# Patient Record
Sex: Male | Born: 1945
Health system: Southern US, Community
[De-identification: ages and names within clinical notes are randomized; demographics above are authoritative.]

## PROBLEM LIST (undated history)

## (undated) DIAGNOSIS — K219 Gastro-esophageal reflux disease without esophagitis: Secondary | ICD-10-CM

## (undated) DIAGNOSIS — K759 Inflammatory liver disease, unspecified: Secondary | ICD-10-CM

## (undated) DIAGNOSIS — M1990 Unspecified osteoarthritis, unspecified site: Secondary | ICD-10-CM

## (undated) DIAGNOSIS — I499 Cardiac arrhythmia, unspecified: Secondary | ICD-10-CM

## (undated) DIAGNOSIS — M719 Bursopathy, unspecified: Secondary | ICD-10-CM

## (undated) DIAGNOSIS — R011 Cardiac murmur, unspecified: Secondary | ICD-10-CM

## (undated) DIAGNOSIS — I10 Essential (primary) hypertension: Secondary | ICD-10-CM

## (undated) DIAGNOSIS — K259 Gastric ulcer, unspecified as acute or chronic, without hemorrhage or perforation: Secondary | ICD-10-CM

## (undated) HISTORY — PX: TONSILLECTOMY: SUR1361

## (undated) HISTORY — PX: CARDIAC CATHETERIZATION: SHX172

## (undated) HISTORY — PX: BACK SURGERY: SHX140

## (undated) HISTORY — DX: Gastric ulcer, unspecified as acute or chronic, without hemorrhage or perforation: K25.9

## (undated) HISTORY — PX: COLONOSCOPY W/ POLYPECTOMY: SHX1380

---

## 2005-10-19 ENCOUNTER — Ambulatory Visit: Payer: Self-pay

## 2006-06-06 ENCOUNTER — Emergency Department: Payer: Self-pay | Admitting: Emergency Medicine

## 2006-06-06 ENCOUNTER — Other Ambulatory Visit: Payer: Self-pay

## 2006-12-05 ENCOUNTER — Ambulatory Visit: Payer: Self-pay | Admitting: Gastroenterology

## 2009-04-21 ENCOUNTER — Ambulatory Visit: Payer: Self-pay | Admitting: Family Medicine

## 2009-05-24 ENCOUNTER — Ambulatory Visit: Payer: Self-pay | Admitting: Family Medicine

## 2009-06-29 ENCOUNTER — Other Ambulatory Visit: Payer: Self-pay | Admitting: Family Medicine

## 2009-09-21 ENCOUNTER — Ambulatory Visit: Payer: Self-pay | Admitting: Family Medicine

## 2010-10-04 LAB — HM COLONOSCOPY

## 2010-10-20 ENCOUNTER — Ambulatory Visit: Payer: Self-pay | Admitting: Cardiovascular Disease

## 2014-03-06 ENCOUNTER — Ambulatory Visit: Payer: Self-pay | Admitting: Family Medicine

## 2014-08-20 ENCOUNTER — Ambulatory Visit
Admit: 2014-08-20 | Disposition: A | Discharge status: Home / Self Care | Payer: Self-pay | Attending: Family Medicine | Admitting: Family Medicine

## 2015-07-20 ENCOUNTER — Emergency Department
Admission: EM | Admit: 2015-07-20 | Discharge: 2015-07-20 | Disposition: A | Discharge status: Home / Self Care | Payer: PPO | Attending: Emergency Medicine | Admitting: Emergency Medicine

## 2015-07-20 ENCOUNTER — Ambulatory Visit: Payer: PPO | Admitting: Family Medicine

## 2015-07-20 ENCOUNTER — Encounter: Payer: Self-pay | Admitting: Medical Oncology

## 2015-07-20 ENCOUNTER — Telehealth: Payer: Self-pay

## 2015-07-20 ENCOUNTER — Emergency Department: Payer: PPO

## 2015-07-20 DIAGNOSIS — Z7982 Long term (current) use of aspirin: Secondary | ICD-10-CM | POA: Diagnosis not present

## 2015-07-20 DIAGNOSIS — I2510 Atherosclerotic heart disease of native coronary artery without angina pectoris: Secondary | ICD-10-CM | POA: Insufficient documentation

## 2015-07-20 DIAGNOSIS — R079 Chest pain, unspecified: Secondary | ICD-10-CM | POA: Diagnosis not present

## 2015-07-20 DIAGNOSIS — R0789 Other chest pain: Secondary | ICD-10-CM | POA: Insufficient documentation

## 2015-07-20 DIAGNOSIS — I509 Heart failure, unspecified: Secondary | ICD-10-CM | POA: Insufficient documentation

## 2015-07-20 DIAGNOSIS — I10 Essential (primary) hypertension: Secondary | ICD-10-CM | POA: Diagnosis not present

## 2015-07-20 DIAGNOSIS — Z79899 Other long term (current) drug therapy: Secondary | ICD-10-CM | POA: Diagnosis not present

## 2015-07-20 DIAGNOSIS — Z791 Long term (current) use of non-steroidal anti-inflammatories (NSAID): Secondary | ICD-10-CM | POA: Insufficient documentation

## 2015-07-20 HISTORY — DX: Unspecified osteoarthritis, unspecified site: M19.90

## 2015-07-20 LAB — CBC
HCT: 40.7 % (ref 40.0–52.0)
Hemoglobin: 14.2 g/dL (ref 13.0–18.0)
MCH: 30.7 pg (ref 26.0–34.0)
MCHC: 34.8 g/dL (ref 32.0–36.0)
MCV: 88.1 fL (ref 80.0–100.0)
PLATELETS: 156 10*3/uL (ref 150–440)
RBC: 4.62 MIL/uL (ref 4.40–5.90)
RDW: 12.7 % (ref 11.5–14.5)
WBC: 7.8 10*3/uL (ref 3.8–10.6)

## 2015-07-20 LAB — BASIC METABOLIC PANEL
Anion gap: 6 (ref 5–15)
BUN: 25 mg/dL — ABNORMAL HIGH (ref 6–20)
CALCIUM: 8.8 mg/dL — AB (ref 8.9–10.3)
CO2: 26 mmol/L (ref 22–32)
CREATININE: 1.37 mg/dL — AB (ref 0.61–1.24)
Chloride: 110 mmol/L (ref 101–111)
GFR, EST AFRICAN AMERICAN: 58 mL/min — AB (ref >60)
GFR, EST NON AFRICAN AMERICAN: 50 mL/min — AB (ref >60)
Glucose, Bld: 104 mg/dL — ABNORMAL HIGH (ref 65–99)
Potassium: 4.4 mmol/L (ref 3.5–5.1)
SODIUM: 142 mmol/L (ref 135–145)

## 2015-07-20 LAB — TROPONIN I
TROPONIN I: 0.04 ng/mL — AB (ref <0.031)
Troponin I: <0.03 ng/mL (ref <0.031)

## 2015-07-20 MED ORDER — ASPIRIN 81 MG PO CHEW
324.0000 mg | CHEWABLE_TABLET | Freq: Once | ORAL | Status: AC
  Administered 2015-07-20: 324 mg via ORAL
  Filled 2015-07-20: qty 4

## 2015-07-20 MED ORDER — ONDANSETRON 8 MG PO TBDP
8.0000 mg | ORAL_TABLET | Freq: Once | ORAL | Status: AC
  Administered 2015-07-20: 8 mg via ORAL
  Filled 2015-07-20: qty 1

## 2015-07-20 NOTE — ED Provider Notes (Signed)
[facility] Emergency Department Provider Note  ____________________________________________  Time seen: 12:15 PM  I have reviewed the triage vital signs and the nursing notes.   HISTORY  Chief Complaint Chest Pain    HPI Darius Parker is a 70 y.o. male who woke up around 8:00 this morning and while he was sitting down for breakfast around 8:30 or 9:00 he had the sudden onset of left-sided chest pain. It was sharp and lasted for 1 or 2 seconds of moderate intensity, and then resolved. Nonradiating, he felt cold but no shortness of breath vomiting or diaphoresis. Throat there is the morning he's continued his usual activity without any exertional symptoms. He denies any orthopnea or PND or other worsening of his chronic medical conditions.  He called his cardiologist's clinic Dr. Nehemiah Massed who noted they could work him in at 2:30 PM today. The patient felt like he should be seen sooner than that, so he called his primary care doctor who encouraged him to come to the emergency room. He's been taking his usual medicines including 81 mg aspirin this morning.     Past Medical History  Diagnosis Date  . Arthritis    Past Medical and Surgical History  Past Medical History Past Medical History  Diagnosis Date  . Hyperlipidemia  . Hypertension  . Coronary artery disease  . Cardiomyopathy, dilated  . CHF (congestive heart failure)   Past Surgical History He has past surgical history that includes Cardiac cath.   Medications and Allergies  Current Medications  Current Outpatient Prescriptions  Medication Sig Dispense Refill  . aspirin 81 MG EC tablet Take 81 mg by mouth once daily.  Marland Kitchen atorvastatin (LIPITOR) 10 MG tablet take 1 tablet by mouth NIGHTLY 90 tablet 2  . carvedilol (COREG) 12.5 MG tablet take 1 tablet by mouth twice a day with meals 180 tablet 2  . enalapril (VASOTEC) 10 MG tablet Take 1 tablet (10 mg total) by mouth 2 (two) times daily. 180  tablet 2  . meloxicam (MOBIC) 15 MG tablet 0  . omega-3 acid ethyl esters (LOVAZA) 1 gram capsule Take 2 capsules (2 g total) by mouth 2 (two) times daily. 360 capsule 2   No current facility-administered medications for this visit.   Allergies: Tetanus vaccines & toxoid  Social and Family History  Social History reports that he has never smoked. He does not have any smokeless tobacco history on file. He reports that he does not drink alcohol.  Family History Family History  Problem Relation Age of Onset  . Diabetes mellitus Father  . Heart disease Father     There are no active problems to display for this patient.    History reviewed. No pertinent past surgical history.   Current Outpatient Rx  Name  Route  Sig  Dispense  Refill  . aspirin EC 81 MG tablet   Oral   Take 81 mg by mouth daily.         Marland Kitchen atorvastatin (LIPITOR) 10 MG tablet   Oral   Take 10 mg by mouth at bedtime.          . carvedilol (COREG) 12.5 MG tablet   Oral   Take 12.5 mg by mouth 2 (two) times daily with a meal.         . enalapril (VASOTEC) 10 MG tablet   Oral   Take 10 mg by mouth 2 (two) times daily.         Marland Kitchen omega-3  acid ethyl esters (LOVAZA) 1 g capsule   Oral   Take 2 g by mouth 2 (two) times daily.          as above Coreg, enalapril, aspirin, statin   Allergies Tetanus toxoids   No family history on file.  Social History Social History  Substance Use Topics  . Smoking status: Never Smoker   . Smokeless tobacco: None  . Alcohol Use: None    Review of Systems  Constitutional:   No fever or chills. No weight changes Eyes:   No blurry vision or double vision.  ENT:   No sore throat.  Cardiovascular:   Positive fleeting chest pain as above. Respiratory:   No dyspnea or cough. Gastrointestinal:   Negative for abdominal pain, vomiting and diarrhea.  No BRBPR or melena. Genitourinary:   Negative for dysuria or difficulty urinating. Musculoskeletal:   Negative  for back pain. No joint swelling or pain. Skin:   Negative for rash. Neurological:   Negative for headaches, focal weakness or numbness. Psychiatric:  No anxiety or depression.   Endocrine:  No changes in energy or sleep difficulty.  10-point ROS otherwise negative.  ____________________________________________   PHYSICAL EXAM:  VITAL SIGNS: ED Triage Vitals  Enc Vitals Group     BP 07/20/15 1113 123/75 mmHg     Pulse Rate 07/20/15 1113 78     Resp 07/20/15 1113 17     Temp 07/20/15 1113 97.4 F (36.3 C)     Temp Source 07/20/15 1113 Oral     SpO2 07/20/15 1113 97 %     Weight 07/20/15 1111 180 lb (81.647 kg)     Height 07/20/15 1111 6\' 2"  (1.88 m)     Head Cir --      Peak Flow --      Pain Score 07/20/15 1111 0     Pain Loc --      Pain Edu? --      Excl. in [place]? --     Vital signs reviewed, nursing assessments reviewed.   Constitutional:   Alert and oriented. Well appearing and in no distress. Eyes:   No scleral icterus. No conjunctival pallor. PERRL. EOMI ENT   Head:   Normocephalic and atraumatic.   Nose:   No congestion/rhinnorhea. No septal hematoma   Mouth/Throat:   MMM, no pharyngeal erythema. No peritonsillar mass.    Neck:   No stridor. No SubQ emphysema. No meningismus. No JVD Hematological/Lymphatic/Immunilogical:   No cervical lymphadenopathy. Cardiovascular:   RRR. Symmetric bilateral radial and DP pulses.  No murmurs.  Respiratory:   Normal respiratory effort without tachypnea nor retractions. Breath sounds are clear and equal bilaterally. No wheezes/rales/rhonchi. Gastrointestinal:   Soft and nontender. Non distended. There is no CVA tenderness.  No rebound, rigidity, or guarding. Genitourinary:   deferred Musculoskeletal:   Nontender with normal range of motion in all extremities. No joint effusions.  No lower extremity tenderness.  No edema. Chest wall nontender Neurologic:   Normal speech and language.  CN 2-10 normal. Motor grossly  intact. No gross focal neurologic deficits are appreciated.  Skin:    Skin is warm, dry and intact. No rash noted.  No petechiae, purpura, or bullae. Psychiatric:   Mood and affect are normal. ____________________________________________    LABS (pertinent positives/negatives) (all labs ordered are listed, but only abnormal results are displayed) Labs Reviewed  BASIC METABOLIC PANEL - Abnormal; Notable for the following:    Glucose, Bld 104 (*)  BUN 25 (*)    Creatinine, Ser 1.37 (*)    Calcium 8.8 (*)    GFR calc non Af Amer 50 (*)    GFR calc Af Amer 58 (*)    All other components within normal limits  TROPONIN I - Abnormal; Notable for the following:    Troponin I 0.04 (*)    All other components within normal limits  CBC  TROPONIN I   ____________________________________________   EKG  Interpreted by me Sinus rhythm rate of 81, normal axis and intervals. Left bundle-branch block with associated repolarization abnormality, no Sgarbossa criteria. Unchanged from 06/06/2006  ____________________________________________    RADIOLOGY  Chest x-ray unremarkable  ____________________________________________   PROCEDURES   ____________________________________________   INITIAL IMPRESSION / ASSESSMENT AND PLAN / ED COURSE  Pertinent labs & imaging results that were available during my care of the patient were reviewed by me and considered in my medical decision making (see chart for details).  Patient presents with a brief episode of fleeting chest pain that lasted probably a few seconds and is not consistent with ACS.Considering the patient's symptoms, medical history, and physical examination today, I have low suspicion for ACS, PE, TAD, pneumothorax, carditis, mediastinitis, pneumonia, CHF, or sepsis.  appears his chronic medical conditions are roughly at baseline. He does not appear to be volume overloaded. No distress. I'll check a repeat troponin and then plan  for follow-up with cardiology. Zofran for nausea and full dose aspirin for now. Likely his mildly elevated troponin of 0.04 is chronic due to his systolic heart failure with an EF of 20% and dilated cardiomyopathy.   ----------------------------------------- 3:30 PM on 07/20/2015 -----------------------------------------  Repeat troponin negative. No recurrence of chest pain in the emergency department. Vital signs are stable and normal. We'll discharge home to follow up with cardiology Dr. Alveria Apley office in one day.    ____________________________________________   FINAL CLINICAL IMPRESSION(S) / ED DIAGNOSES  Final diagnoses:  Atypical chest pain      Carrie Mew, MD 07/20/15 1530

## 2015-07-20 NOTE — Telephone Encounter (Signed)
Patient reports that he woke up this morning with discomfort in his chest. He describes it as a pressure sensation. Patient denies any shortness of breath, numbness or tingling in arms, headache, lightheadedness, or back pain. He reports that the pain is located in his mid-chest. He does feel a little nauseated. Denies any reflux symptoms or injuries. Patient has not been taking anything for pain. Patient does have history of HTN. Reports that this has been stable. Scheduled patient to be seen in the office today at 11:30.

## 2015-07-20 NOTE — Discharge Instructions (Signed)
Nonspecific Chest Pain  °Chest pain can be caused by many different conditions. There is always a chance that your pain could be related to something serious, such as a heart attack or a blood clot in your lungs. Chest pain can also be caused by conditions that are not life-threatening. If you have chest pain, it is very important to follow up with your health care provider. °CAUSES  °Chest pain can be caused by: °· Heartburn. °· Pneumonia or bronchitis. °· Anxiety or stress. °· Inflammation around your heart (pericarditis) or lung (pleuritis or pleurisy). °· A blood clot in your lung. °· A collapsed lung (pneumothorax). It can develop suddenly on its own (spontaneous pneumothorax) or from trauma to the chest. °· Shingles infection (varicella-zoster virus). °· Heart attack. °· Damage to the bones, muscles, and cartilage that make up your chest wall. This can include: °¨ Bruised bones due to injury. °¨ Strained muscles or cartilage due to frequent or repeated coughing or overwork. °¨ Fracture to one or more ribs. °¨ Sore cartilage due to inflammation (costochondritis). °RISK FACTORS  °Risk factors for chest pain may include: °· Activities that increase your risk for trauma or injury to your chest. °· Respiratory infections or conditions that cause frequent coughing. °· Medical conditions or overeating that can cause heartburn. °· Heart disease or family history of heart disease. °· Conditions or health behaviors that increase your risk of developing a blood clot. °· Having had chicken pox (varicella zoster). °SIGNS AND SYMPTOMS °Chest pain can feel like: °· Burning or tingling on the surface of your chest or deep in your chest. °· Crushing, pressure, aching, or squeezing pain. °· Dull or sharp pain that is worse when you move, cough, or take a deep breath. °· Pain that is also felt in your back, neck, shoulder, or arm, or pain that spreads to any of these areas. °Your chest pain may come and go, or it may stay  constant. °DIAGNOSIS °Lab tests or other studies may be needed to find the cause of your pain. Your health care provider may have you take a test called an ambulatory ECG (electrocardiogram). An ECG records your heartbeat patterns at the time the test is performed. You may also have other tests, such as: °· Transthoracic echocardiogram (TTE). During echocardiography, sound waves are used to create a picture of all of the heart structures and to look at how blood flows through your heart. °· Transesophageal echocardiogram (TEE). This is a more advanced imaging test that obtains images from inside your body. It allows your health care provider to see your heart in finer detail. °· Cardiac monitoring. This allows your health care provider to monitor your heart rate and rhythm in real time. °· Holter monitor. This is a portable device that records your heartbeat and can help to diagnose abnormal heartbeats. It allows your health care provider to track your heart activity for several days, if needed. °· Stress tests. These can be done through exercise or by taking medicine that makes your heart beat more quickly. °· Blood tests. °· Imaging tests. °TREATMENT  °Your treatment depends on what is causing your chest pain. Treatment may include: °· Medicines. These may include: °¨ Acid blockers for heartburn. °¨ Anti-inflammatory medicine. °¨ Pain medicine for inflammatory conditions. °¨ Antibiotic medicine, if an infection is present. °¨ Medicines to dissolve blood clots. °¨ Medicines to treat coronary artery disease. °· Supportive care for conditions that do not require medicines. This may include: °¨ Resting. °¨ Applying heat   or cold packs to injured areas. °¨ Limiting activities until pain decreases. °HOME CARE INSTRUCTIONS °· If you were prescribed an antibiotic medicine, finish it all even if you start to feel better. °· Avoid any activities that bring on chest pain. °· Do not use any tobacco products, including  cigarettes, chewing tobacco, or electronic cigarettes. If you need help quitting, ask your health care provider. °· Do not drink alcohol. °· Take medicines only as directed by your health care provider. °· Keep all follow-up visits as directed by your health care provider. This is important. This includes any further testing if your chest pain does not go away. °· If heartburn is the cause for your chest pain, you may be told to keep your head raised (elevated) while sleeping. This reduces the chance that acid will go from your stomach into your esophagus. °· Make lifestyle changes as directed by your health care provider. These may include: °¨ Getting regular exercise. Ask your health care provider to suggest some activities that are safe for you. °¨ Eating a heart-healthy diet. A registered dietitian can help you to learn healthy eating options. °¨ Maintaining a healthy weight. °¨ Managing diabetes, if necessary. °¨ Reducing stress. °SEEK MEDICAL CARE IF: °· Your chest pain does not go away after treatment. °· You have a rash with blisters on your chest. °· You have a fever. °SEEK IMMEDIATE MEDICAL CARE IF:  °· Your chest pain is worse. °· You have an increasing cough, or you cough up blood. °· You have severe abdominal pain. °· You have severe weakness. °· You faint. °· You have chills. °· You have sudden, unexplained chest discomfort. °· You have sudden, unexplained discomfort in your arms, back, neck, or jaw. °· You have shortness of breath at any time. °· You suddenly start to sweat, or your skin gets clammy. °· You feel nauseous or you vomit. °· You suddenly feel light-headed or dizzy. °· Your heart begins to beat quickly, or it feels like it is skipping beats. °These symptoms may represent a serious problem that is an emergency. Do not wait to see if the symptoms will go away. Get medical help right away. Call your local emergency services (911 in the [place] Do not drive yourself to the hospital. °  °This  information is not intended to replace advice given to you by your health care provider. Make sure you discuss any questions you have with your health care provider. °  °Document Released: 02/15/2005 Document Revised: 05/29/2014 Document Reviewed: 12/12/2013 °Elsevier Interactive Patient Education ©2016 [employer]. ° °

## 2015-07-20 NOTE — ED Notes (Signed)
Pt reports he woke up this am around 0800 with sharp pain to left side of chest. Pt also reports nausea.

## 2015-08-05 DIAGNOSIS — H25813 Combined forms of age-related cataract, bilateral: Secondary | ICD-10-CM | POA: Diagnosis not present

## 2015-08-05 DIAGNOSIS — H01009 Unspecified blepharitis unspecified eye, unspecified eyelid: Secondary | ICD-10-CM | POA: Diagnosis not present

## 2015-11-26 ENCOUNTER — Other Ambulatory Visit: Payer: Self-pay | Admitting: Family Medicine

## 2015-11-26 DIAGNOSIS — E785 Hyperlipidemia, unspecified: Secondary | ICD-10-CM

## 2015-11-26 MED ORDER — OMEGA-3-ACID ETHYL ESTERS 1 G PO CAPS: 2.0000 g | ORAL_CAPSULE | Freq: Two times a day (BID) | ORAL | Status: DC

## 2015-11-29 ENCOUNTER — Telehealth: Payer: Self-pay | Admitting: Family Medicine

## 2015-11-29 ENCOUNTER — Other Ambulatory Visit: Payer: Self-pay | Admitting: Family Medicine

## 2015-11-29 DIAGNOSIS — E785 Hyperlipidemia, unspecified: Secondary | ICD-10-CM

## 2015-11-29 MED ORDER — OMEGA-3-ACID ETHYL ESTERS 1 G PO CAPS: 1.0000 g | ORAL_CAPSULE | Freq: Two times a day (BID) | ORAL | Status: DC

## 2015-11-29 NOTE — Telephone Encounter (Signed)
The pharmacy sent me a refill order and I responded to it. If Dr.Kowalski has him on a different dose then go with what Dr. Raliegh Ip recommends. What is the dose he is on so we can correct our records.

## 2015-11-29 NOTE — Telephone Encounter (Signed)
Advised pt. Is taking 1 gm BID. Changed in records. Renaldo Fiddler, CMA

## 2015-11-29 NOTE — Telephone Encounter (Signed)
Pt would like to know why we filled the omega-3 acid ethyl esters (LOVAZA) 1 g capsule instead of Dr. Alveria Apley office.  The dosage has changed and he wants to know why.

## 2015-11-29 NOTE — Telephone Encounter (Signed)
Was signed on 11/26/2015. Please advise. Renaldo Fiddler, CMA

## 2016-01-18 DIAGNOSIS — R42 Dizziness and giddiness: Secondary | ICD-10-CM | POA: Diagnosis not present

## 2016-01-18 DIAGNOSIS — E782 Mixed hyperlipidemia: Secondary | ICD-10-CM | POA: Diagnosis not present

## 2016-01-18 DIAGNOSIS — I10 Essential (primary) hypertension: Secondary | ICD-10-CM | POA: Diagnosis not present

## 2016-01-18 DIAGNOSIS — I5022 Chronic systolic (congestive) heart failure: Secondary | ICD-10-CM | POA: Diagnosis not present

## 2016-02-17 DIAGNOSIS — H2513 Age-related nuclear cataract, bilateral: Secondary | ICD-10-CM | POA: Diagnosis not present

## 2016-02-24 DIAGNOSIS — H2513 Age-related nuclear cataract, bilateral: Secondary | ICD-10-CM | POA: Diagnosis not present

## 2016-02-28 ENCOUNTER — Encounter: Payer: Self-pay | Admitting: *Deleted

## 2016-03-01 ENCOUNTER — Ambulatory Visit: Payer: PPO | Admitting: Anesthesiology

## 2016-03-01 ENCOUNTER — Encounter: Payer: Self-pay | Admitting: *Deleted

## 2016-03-01 ENCOUNTER — Ambulatory Visit
Admission: RE | Admit: 2016-03-01 | Discharge: 2016-03-01 | Disposition: A | Discharge status: Home / Self Care | Payer: PPO | Source: Ambulatory Visit | Attending: Ophthalmology | Admitting: Ophthalmology

## 2016-03-01 ENCOUNTER — Encounter
Admission: RE | Disposition: A | Discharge status: Home / Self Care | Payer: Self-pay | Source: Ambulatory Visit | Attending: Ophthalmology

## 2016-03-01 DIAGNOSIS — R011 Cardiac murmur, unspecified: Secondary | ICD-10-CM | POA: Insufficient documentation

## 2016-03-01 DIAGNOSIS — K5790 Diverticulosis of intestine, part unspecified, without perforation or abscess without bleeding: Secondary | ICD-10-CM | POA: Diagnosis not present

## 2016-03-01 DIAGNOSIS — Z86010 Personal history of colonic polyps: Secondary | ICD-10-CM | POA: Insufficient documentation

## 2016-03-01 DIAGNOSIS — H2511 Age-related nuclear cataract, right eye: Secondary | ICD-10-CM | POA: Insufficient documentation

## 2016-03-01 DIAGNOSIS — I499 Cardiac arrhythmia, unspecified: Secondary | ICD-10-CM | POA: Diagnosis not present

## 2016-03-01 DIAGNOSIS — E7800 Pure hypercholesterolemia, unspecified: Secondary | ICD-10-CM | POA: Insufficient documentation

## 2016-03-01 DIAGNOSIS — Z887 Allergy status to serum and vaccine status: Secondary | ICD-10-CM | POA: Diagnosis not present

## 2016-03-01 DIAGNOSIS — I10 Essential (primary) hypertension: Secondary | ICD-10-CM | POA: Insufficient documentation

## 2016-03-01 DIAGNOSIS — M1990 Unspecified osteoarthritis, unspecified site: Secondary | ICD-10-CM | POA: Insufficient documentation

## 2016-03-01 DIAGNOSIS — M719 Bursopathy, unspecified: Secondary | ICD-10-CM | POA: Diagnosis not present

## 2016-03-01 DIAGNOSIS — H2513 Age-related nuclear cataract, bilateral: Secondary | ICD-10-CM | POA: Diagnosis not present

## 2016-03-01 HISTORY — DX: Bursopathy, unspecified: M71.9

## 2016-03-01 HISTORY — DX: Essential (primary) hypertension: I10

## 2016-03-01 HISTORY — DX: Cardiac arrhythmia, unspecified: I49.9

## 2016-03-01 HISTORY — DX: Cardiac murmur, unspecified: R01.1

## 2016-03-01 HISTORY — DX: Inflammatory liver disease, unspecified: K75.9

## 2016-03-01 HISTORY — PX: CATARACT EXTRACTION W/PHACO: SHX586

## 2016-03-01 SURGERY — PHACOEMULSIFICATION, CATARACT, WITH IOL INSERTION
Anesthesia: General | Site: Eye | Laterality: Right | Wound class: Clean

## 2016-03-01 MED ORDER — SILVER NITRATE-POT NITRATE 75-25 % EX MISC
CUTANEOUS | Status: AC
  Filled 2016-03-01: qty 1

## 2016-03-01 MED ORDER — ONDANSETRON HCL 4 MG/2ML IJ SOLN
INTRAMUSCULAR | Status: DC | PRN
  Administered 2016-03-01: 4 mg via INTRAVENOUS

## 2016-03-01 MED ORDER — POVIDONE-IODINE 5 % OP SOLN
OPHTHALMIC | Status: DC | PRN
  Administered 2016-03-01: 1 via OPHTHALMIC

## 2016-03-01 MED ORDER — HYALURONIDASE HUMAN 150 UNIT/ML IJ SOLN
INTRAMUSCULAR | Status: AC
  Filled 2016-03-01: qty 1

## 2016-03-01 MED ORDER — MOXIFLOXACIN HCL 0.5 % OP SOLN
OPHTHALMIC | Status: AC
  Administered 2016-03-01: 1 [drp] via OPHTHALMIC
  Filled 2016-03-01: qty 3

## 2016-03-01 MED ORDER — EPINEPHRINE PF 1 MG/ML IJ SOLN
INTRAMUSCULAR | Status: AC
  Filled 2016-03-01: qty 2

## 2016-03-01 MED ORDER — PHENYLEPHRINE HCL 10 % OP SOLN
OPHTHALMIC | Status: AC
  Administered 2016-03-01: 1 [drp] via OPHTHALMIC
  Filled 2016-03-01: qty 5

## 2016-03-01 MED ORDER — EPINEPHRINE PF 1 MG/ML IJ SOLN
INTRAOCULAR | Status: DC | PRN
  Administered 2016-03-01: 1 mL via OPHTHALMIC

## 2016-03-01 MED ORDER — TETRACAINE HCL 0.5 % OP SOLN
OPHTHALMIC | Status: AC
  Filled 2016-03-01: qty 2

## 2016-03-01 MED ORDER — CEFUROXIME OPHTHALMIC INJECTION 1 MG/0.1 ML
INJECTION | OPHTHALMIC | Status: DC | PRN
  Administered 2016-03-01: 0.1 mL via INTRACAMERAL

## 2016-03-01 MED ORDER — CEFUROXIME OPHTHALMIC INJECTION 1 MG/0.1 ML
INJECTION | OPHTHALMIC | Status: AC
  Filled 2016-03-01: qty 0.1

## 2016-03-01 MED ORDER — SODIUM CHLORIDE 0.9 % IV SOLN
INTRAVENOUS | Status: DC
  Administered 2016-03-01: 08:00:00 via INTRAVENOUS

## 2016-03-01 MED ORDER — NA CHONDROIT SULF-NA HYALURON 40-17 MG/ML IO SOLN
INTRAOCULAR | Status: AC
  Filled 2016-03-01: qty 1

## 2016-03-01 MED ORDER — LIDOCAINE HCL (PF) 4 % IJ SOLN
INTRAMUSCULAR | Status: DC | PRN
  Administered 2016-03-01: 4 mL via OPHTHALMIC

## 2016-03-01 MED ORDER — LIDOCAINE HCL (PF) 4 % IJ SOLN
INTRAMUSCULAR | Status: AC
  Filled 2016-03-01: qty 5

## 2016-03-01 MED ORDER — EPINEPHRINE PF 1 MG/ML IJ SOLN
INTRAMUSCULAR | Status: AC
  Filled 2016-03-01: qty 1

## 2016-03-01 MED ORDER — LIDOCAINE HCL (PF) 4 % IJ SOLN
INTRAMUSCULAR | Status: DC | PRN
  Administered 2016-03-01: 9 mL via OPHTHALMIC

## 2016-03-01 MED ORDER — CYCLOPENTOLATE HCL 2 % OP SOLN
1.0000 [drp] | OPHTHALMIC | Status: AC
  Administered 2016-03-01 (×3): 1 [drp] via OPHTHALMIC

## 2016-03-01 MED ORDER — CYCLOPENTOLATE HCL 2 % OP SOLN
OPHTHALMIC | Status: AC
Start: 2016-03-01 — End: 2016-03-01
  Administered 2016-03-01: 1 [drp] via OPHTHALMIC
  Filled 2016-03-01: qty 2

## 2016-03-01 MED ORDER — CARBACHOL 0.01 % IO SOLN
INTRAOCULAR | Status: DC | PRN
  Administered 2016-03-01: 0.5 mL via INTRAOCULAR

## 2016-03-01 MED ORDER — MIDAZOLAM HCL 2 MG/2ML IJ SOLN
INTRAMUSCULAR | Status: DC | PRN
  Administered 2016-03-01 (×2): 1 mg via INTRAVENOUS

## 2016-03-01 MED ORDER — BUPIVACAINE HCL (PF) 0.75 % IJ SOLN
INTRAMUSCULAR | Status: AC
  Filled 2016-03-01: qty 10

## 2016-03-01 MED ORDER — NA CHONDROIT SULF-NA HYALURON 40-17 MG/ML IO SOLN
INTRAOCULAR | Status: DC | PRN
  Administered 2016-03-01: 1 mL via INTRAOCULAR

## 2016-03-01 MED ORDER — ALFENTANIL 500 MCG/ML IJ INJ
INJECTION | INTRAMUSCULAR | Status: DC | PRN
  Administered 2016-03-01: 500 ug via INTRAVENOUS

## 2016-03-01 MED ORDER — MOXIFLOXACIN HCL 0.5 % OP SOLN
OPHTHALMIC | Status: DC | PRN
  Administered 2016-03-01: 1 [drp] via OPHTHALMIC

## 2016-03-01 MED ORDER — MOXIFLOXACIN HCL 0.5 % OP SOLN
1.0000 [drp] | OPHTHALMIC | Status: AC
  Administered 2016-03-01 (×3): 1 [drp] via OPHTHALMIC

## 2016-03-01 MED ORDER — TETRACAINE HCL 0.5 % OP SOLN
OPHTHALMIC | Status: DC | PRN
  Administered 2016-03-01: 1 [drp] via OPHTHALMIC

## 2016-03-01 MED ORDER — PHENYLEPHRINE HCL 10 % OP SOLN
1.0000 [drp] | OPHTHALMIC | Status: AC
  Administered 2016-03-01 (×3): 1 [drp] via OPHTHALMIC

## 2016-03-01 MED ORDER — POVIDONE-IODINE 5 % OP SOLN
OPHTHALMIC | Status: AC
  Filled 2016-03-01: qty 30

## 2016-03-01 SURGICAL SUPPLY — 29 items

## 2016-03-01 NOTE — Transfer of Care (Signed)
Immediate Anesthesia Transfer of Care Note  Patient: JEFERSON REYMOND  Procedure(s) Performed: Procedure(s) with comments: CATARACT EXTRACTION PHACO AND INTRAOCULAR LENS PLACEMENT (IOC) (Right) - [place] 1.33 AP% 21.6 CDE 37.45 Fluid Pack Lot # [id number]  Patient Location: PACU  Anesthesia Type:General  Level of Consciousness: sedated  Airway & Oxygen Therapy: Patient Spontanous Breathing and Patient connected to face mask oxygen  Post-op Assessment: Report given to RN and Post -op Vital signs reviewed and stable  Post vital signs: Reviewed and stable  Last Vitals:  Vitals:   03/01/16 0946 03/01/16 0950  BP: 116/71 116/71  Pulse: 70   Resp: 16 12  Temp: 36.7 C [id number] C    Complications: No apparent anesthesia complications

## 2016-03-01 NOTE — Anesthesia Preprocedure Evaluation (Signed)
Anesthesia Evaluation  Patient identified by MRN, date of birth, ID band Patient awake    Reviewed: Allergy & Precautions, H&P , NPO status , Patient's Chart, lab work & pertinent test results, reviewed documented beta blocker date and time   Airway Mallampati: II   Neck ROM: full    Dental  (+) Poor Dentition   Pulmonary neg pulmonary ROS,    Pulmonary exam normal        Cardiovascular hypertension, negative cardio ROS Normal cardiovascular exam+ dysrhythmias + Valvular Problems/Murmurs      Neuro/Psych negative neurological ROS  negative psych ROS   GI/Hepatic negative GI ROS, Neg liver ROS, (+) Hepatitis -  Endo/Other  negative endocrine ROS  Renal/GU negative Renal ROS  negative genitourinary   Musculoskeletal   Abdominal   Peds  Hematology negative hematology ROS (+)   Anesthesia Other Findings Past Medical History: No date: Arthritis No date: Bursitis No date: Dysrhythmia No date: Heart murmur No date: Hepatitis No date: Hypertension Past Surgical History: No date: BACK SURGERY No date: CARDIAC CATHETERIZATION No date: COLONOSCOPY W/ POLYPECTOMY No date: TONSILLECTOMY BMI    Body Mass Index:  22.47 kg/m     Reproductive/Obstetrics                             Anesthesia Physical Anesthesia Plan  ASA: III  Anesthesia Plan: General   Post-op Pain Management:    Induction:   Airway Management Planned:   Additional Equipment:   Intra-op Plan:   Post-operative Plan:   Informed Consent: I have reviewed the patients History and Physical, chart, labs and discussed the procedure including the risks, benefits and alternatives for the proposed anesthesia with the patient or authorized representative who has indicated his/her understanding and acceptance.   Dental Advisory Given  Plan Discussed with: CRNA  Anesthesia Plan Comments:         Anesthesia Quick  Evaluation

## 2016-03-01 NOTE — Anesthesia Procedure Notes (Signed)
Procedure Name: MAC Date/Time: 03/01/2016 9:10 AM Performed by: Doreen Salvage Pre-anesthesia Checklist: Patient identified, Emergency Drugs available, Suction available and Patient being monitored Patient Re-evaluated:Patient Re-evaluated prior to inductionOxygen Delivery Method: Nasal cannula

## 2016-03-01 NOTE — Op Note (Signed)
Date of Surgery: 03/01/2016 Date of Dictation: 03/01/2016 9:47 AM Pre-operative Diagnosis:  Nuclear Sclerotic Cataract right Eye Post-operative Diagnosis: same Procedure performed: Extra-capsular Cataract Extraction (ECCE) with placement of a posterior chamber intraocular lens (IOL) right Eye IOL:  Implant Name Type Inv. Item Serial No. Manufacturer Lot No. LRB No. Used  LENS IOL ACRYSOF IQ 20.5 - [id number] Intraocular Lens LENS IOL ACRYSOF IQ 20.5 [id number] ALCON   Right 1   Anesthesia: 2% Lidocaine and 4% Marcaine in a 50/50 mixture with 10 unites/ml of Hylenex given as a peribulbar Anesthesiologist: Anesthesiologist: Molli Barrows, MD CRNA: Doreen Salvage, CRNA Complications: none Estimated Blood Loss: less than 1 ml  Description of procedure:  The patient was given anesthesia and sedation via intravenous access. The patient was then prepped and draped in the usual fashion. A 25-gauge needle was bent for initiating the capsulorhexis. A 5-0 silk suture was placed through the conjunctiva superior and inferiorly to serve as bridle sutures. Hemostasis was obtained at the superior limbus using an eraser cautery. A partial thickness groove was made at the anterior surgical limbus with a 64 Beaver blade and this was dissected anteriorly with an Avaya. The anterior chamber was entered at 10 o'clock with a 1.0 mm paracentesis knife and through the lamellar dissection with a 2.6 mm Alcon keratome. Epi-Shugarcaine 0.5 CC [9 cc BSS Plus (Alcon), 3 cc 4% preservative-free lidocaine (Hospira) and 4 cc 1:1000 preservative-free, bisulfite-free epinephrine] was injected into the anterior chamber via the paracentesis tract. Epi-Shugarcaine 0.5 CC [9 cc BSS Plus (Alcon), 3 cc 4% preservative-free lidocaine (Hospira) and 4 cc 1:1000 preservative-free, bisulfite-free epinephrine] was injected into the anterior chamber via the paracentesis tract. DiscoVisc was injected to replace the aqueous and a  continuous tear curvilinear capsulorhexis was performed using a bent 25-gauge needle.  Balance salt on a syringe was used to perform hydro-dissection and phacoemulsification was carried out using a divide and conquer technique. Procedure(s) with comments: CATARACT EXTRACTION PHACO AND INTRAOCULAR LENS PLACEMENT (IOC) (Right) - [place] 1.33 AP% 21.6 CDE 37.45 Fluid Pack Lot # [id number]. Irrigation/aspiration was used to remove the residual cortex and the capsular bag was inflated with DiscoVisc. The intraocular lens was inserted into the capsular bag using a pre-loaded UltraSert Delivery System. Irrigation/aspiration was used to remove the residual DiscoVisc. The wound was inflated with balanced salt and checked for leaks. None were found. Miostat was injected via the paracentesis track and 0.1 ml of cefuroxime containing 1 mg of drug  was injected via the paracentesis track. The wound was checked for leaks again and none were found.   The bridal sutures were removed and two drops of Vigamox were placed on the eye. An eye shield was placed to protect the eye and the patient was discharged to the recovery area in good condition.   Aiva Miskell MD

## 2016-03-01 NOTE — H&P (Signed)
See scanned note.

## 2016-03-01 NOTE — Discharge Instructions (Signed)
Eye Surgery Discharge Instructions  Expect mild scratchy sensation or mild soreness. DO NOT RUB YOUR EYE!  The day of surgery:  Minimal physical activity, but bed rest is not required  No reading, computer work, or close hand work  No bending, lifting, or straining.  May watch TV  For 24 hours:  No driving, legal decisions, or alcoholic beverages  Safety precautions  Eat anything you prefer: It is better to start with liquids, then soup then solid foods.  _____ Eye patch should be worn until postoperative exam tomorrow.  ____ Solar shield eyeglasses should be worn for comfort in the sunlight/patch while sleeping  Resume all regular medications including aspirin or Coumadin if these were discontinued prior to surgery. You may shower, bathe, shave, or wash your hair. Tylenol may be taken for mild discomfort.  Call your doctor if you experience significant pain, nausea, or vomiting, fever > 101 or other signs of infection. [phone] or [phone] Specific instructions:  Follow-up Information    Shambhavi Salley, MD .   Specialty:  Ophthalmology Why:  October 12 at 10:10am Contact information: [address]   [place] [address] [phone]

## 2016-03-01 NOTE — Anesthesia Postprocedure Evaluation (Signed)
Anesthesia Post Note  Patient: Darius Parker  Procedure(s) Performed: Procedure(s) (LRB): CATARACT EXTRACTION PHACO AND INTRAOCULAR LENS PLACEMENT (IOC) (Right)  Patient location during evaluation: PACU Anesthesia Type: General Level of consciousness: awake and alert Pain management: pain level controlled Vital Signs Assessment: post-procedure vital signs reviewed and stable Respiratory status: spontaneous breathing, nonlabored ventilation, respiratory function stable and patient connected to nasal cannula oxygen Cardiovascular status: blood pressure returned to baseline and stable Postop Assessment: no signs of nausea or vomiting Anesthetic complications: no    Last Vitals:  Vitals:   03/01/16 0950 03/01/16 1003  BP: 116/71 126/67  Pulse:    Resp: 12 14  Temp: 36.7 C     Last Pain:  Vitals:   03/01/16 0950  TempSrc: Temporal                 Molli Barrows

## 2016-03-01 NOTE — Interval H&P Note (Signed)
History and Physical Interval Note:  03/01/2016 9:05 AM  Darius Parker  has presented today for surgery, with the diagnosis of NUCLEAR SCLEROTIC CATARACT RIGHT EYE  The various methods of treatment have been discussed with the patient and family. After consideration of risks, benefits and other options for treatment, the patient has consented to  Procedure(s) with comments: CATARACT EXTRACTION PHACO AND INTRAOCULAR LENS PLACEMENT (IOC) (Right) - [place] AP% CDE Fluid Pack Lot # [id number] as a surgical intervention .  The patient's history has been reviewed, patient examined, no change in status, stable for surgery.  I have reviewed the patient's chart and labs.  Questions were answered to the patient's satisfaction.     Nely Dedmon

## 2016-03-21 DIAGNOSIS — Z961 Presence of intraocular lens: Secondary | ICD-10-CM | POA: Diagnosis not present

## 2016-06-09 ENCOUNTER — Telehealth: Payer: Self-pay | Admitting: Family Medicine

## 2016-06-09 NOTE — Telephone Encounter (Signed)
[id number] 

## 2016-06-09 NOTE — Telephone Encounter (Signed)
If he has any blood in stool, fever over 102, or if unable to hold down liquids then needs office visit or go to ER. Otherwise, take frequent small sips of clear liquids or Gatoraid. Can take OTC Kaopectate for diarrhea.

## 2016-06-09 NOTE — Telephone Encounter (Signed)
Pt has been having diarrhea for several days.  Just wants to know what he should do and what he can take OTC  [phone]  Thanks Con Memos

## 2016-06-09 NOTE — Telephone Encounter (Signed)
Called pt back for more info. Patient stated that yesterday morning he threw-up and was nauseous, later diarrhea started. Patient stated that he has had diarrhea ever since, everything he eats or drinks goes straight through him. No fever or abdominal pain. Please advise?

## 2016-06-13 NOTE — Telephone Encounter (Signed)
Unable to leave a message. Patient's phone just kept ringing. Will try again later.

## 2016-06-15 NOTE — Telephone Encounter (Signed)
Patient stated that his symptoms have cleared up now.

## 2016-11-14 DIAGNOSIS — Z961 Presence of intraocular lens: Secondary | ICD-10-CM | POA: Diagnosis not present

## 2017-01-01 DIAGNOSIS — I10 Essential (primary) hypertension: Secondary | ICD-10-CM | POA: Diagnosis not present

## 2017-01-01 DIAGNOSIS — I2510 Atherosclerotic heart disease of native coronary artery without angina pectoris: Secondary | ICD-10-CM | POA: Diagnosis not present

## 2017-01-01 DIAGNOSIS — I5022 Chronic systolic (congestive) heart failure: Secondary | ICD-10-CM | POA: Diagnosis not present

## 2017-01-01 DIAGNOSIS — E782 Mixed hyperlipidemia: Secondary | ICD-10-CM | POA: Diagnosis not present

## 2017-01-15 DIAGNOSIS — E782 Mixed hyperlipidemia: Secondary | ICD-10-CM | POA: Diagnosis not present

## 2017-01-15 DIAGNOSIS — I5022 Chronic systolic (congestive) heart failure: Secondary | ICD-10-CM | POA: Diagnosis not present

## 2017-01-15 DIAGNOSIS — I10 Essential (primary) hypertension: Secondary | ICD-10-CM | POA: Diagnosis not present

## 2017-01-15 DIAGNOSIS — I2510 Atherosclerotic heart disease of native coronary artery without angina pectoris: Secondary | ICD-10-CM | POA: Diagnosis not present

## 2017-03-27 ENCOUNTER — Ambulatory Visit
Admission: RE | Admit: 2017-03-27 | Discharge: 2017-03-27 | Disposition: A | Discharge status: Home / Self Care | Payer: PPO | Source: Ambulatory Visit | Attending: Family Medicine | Admitting: Family Medicine

## 2017-03-27 ENCOUNTER — Ambulatory Visit: Payer: PPO | Admitting: Family Medicine

## 2017-03-27 ENCOUNTER — Encounter: Payer: Self-pay | Admitting: Family Medicine

## 2017-03-27 VITALS — BP 106/68 | HR 74 | Temp 97.5°F | Resp 16 | Wt 200.4 lb

## 2017-03-27 DIAGNOSIS — M25562 Pain in left knee: Secondary | ICD-10-CM

## 2017-03-27 DIAGNOSIS — M1712 Unilateral primary osteoarthritis, left knee: Secondary | ICD-10-CM | POA: Diagnosis not present

## 2017-03-27 DIAGNOSIS — I2510 Atherosclerotic heart disease of native coronary artery without angina pectoris: Secondary | ICD-10-CM | POA: Insufficient documentation

## 2017-03-27 DIAGNOSIS — M179 Osteoarthritis of knee, unspecified: Secondary | ICD-10-CM | POA: Diagnosis not present

## 2017-03-27 DIAGNOSIS — I10 Essential (primary) hypertension: Secondary | ICD-10-CM | POA: Insufficient documentation

## 2017-03-27 DIAGNOSIS — E782 Mixed hyperlipidemia: Secondary | ICD-10-CM | POA: Insufficient documentation

## 2017-03-27 DIAGNOSIS — I5022 Chronic systolic (congestive) heart failure: Secondary | ICD-10-CM | POA: Insufficient documentation

## 2017-03-27 NOTE — Progress Notes (Signed)
Subjective:     Patient ID: Darius Parker, male   DOB: 01-19-1946, 71 y.o.   MRN: [id number]  HPI  Chief Complaint  Patient presents with  . Knee Pain    Patient comes in office today with complaints of left knee pain. Patient states that he was moving the other day and was going up and down stairs when his knee gave out. Patient reports pain when bearing weight on leg.  States she was carrying a load of wood and was not holding on to the banister. Did not fall and denies prior knee surgery.   Review of Systems     Objective:   Physical Exam  Constitutional: He appears well-developed and well-nourished. He appears distressed (using a s.p. cane with w.b with mild antalgic gait).  Musculoskeletal:  Left knee without erythema, effusion or tenderness on palpation FROM. No patellar tenderness. McMurray's test is negative.       Assessment:    1. Acute pain of left knee - DG Knee Complete 4 Views Left; Future    Plan:    Discussed rest, icing, and Tylenol ES. Possible orthopedic referral pending x-ray report.

## 2017-03-27 NOTE — Patient Instructions (Signed)
Discussed icing for 20 minutes several x day. Add Tylenol  ES up to 3000 mg/day. Rest for now as best you can. We will decide about orthopedic evaluation after x-ray.

## 2017-03-28 ENCOUNTER — Telehealth: Payer: Self-pay

## 2017-03-28 NOTE — Telephone Encounter (Signed)
Give it until the end of the week to improve. Call us if not getting better for referral.

## 2017-03-28 NOTE — Telephone Encounter (Signed)
-----   Message from Carmon Ginsberg, [place] sent at 03/28/2017  7:31 AM EST ----- Mild arthritic changes. If knee not improving would recommend orthopedic evaluation. Do you wish to proceed?

## 2017-03-28 NOTE — Telephone Encounter (Signed)
Patient advised.KW 

## 2017-03-28 NOTE — Telephone Encounter (Signed)
Patient was advised he states that he would like your opinion on how long he should wait before he goes to see orthopedic?

## 2017-07-16 DIAGNOSIS — I5022 Chronic systolic (congestive) heart failure: Secondary | ICD-10-CM | POA: Diagnosis not present

## 2017-07-16 DIAGNOSIS — I10 Essential (primary) hypertension: Secondary | ICD-10-CM | POA: Diagnosis not present

## 2017-07-16 DIAGNOSIS — E782 Mixed hyperlipidemia: Secondary | ICD-10-CM | POA: Diagnosis not present

## 2017-07-16 DIAGNOSIS — I2510 Atherosclerotic heart disease of native coronary artery without angina pectoris: Secondary | ICD-10-CM | POA: Diagnosis not present

## 2017-07-16 DIAGNOSIS — R600 Localized edema: Secondary | ICD-10-CM | POA: Diagnosis not present

## 2017-07-25 DIAGNOSIS — E782 Mixed hyperlipidemia: Secondary | ICD-10-CM | POA: Diagnosis not present

## 2017-08-16 DIAGNOSIS — I10 Essential (primary) hypertension: Secondary | ICD-10-CM | POA: Diagnosis not present

## 2017-08-16 DIAGNOSIS — I5022 Chronic systolic (congestive) heart failure: Secondary | ICD-10-CM | POA: Diagnosis not present

## 2017-08-16 DIAGNOSIS — I2510 Atherosclerotic heart disease of native coronary artery without angina pectoris: Secondary | ICD-10-CM | POA: Diagnosis not present

## 2017-08-16 DIAGNOSIS — R600 Localized edema: Secondary | ICD-10-CM | POA: Diagnosis not present

## 2017-12-05 ENCOUNTER — Ambulatory Visit (INDEPENDENT_AMBULATORY_CARE_PROVIDER_SITE_OTHER): Payer: PPO | Admitting: Family Medicine

## 2017-12-05 ENCOUNTER — Encounter: Payer: Self-pay | Admitting: Family Medicine

## 2017-12-05 VITALS — BP 114/64 | HR 84 | Temp 98.2°F | Resp 16 | Wt 196.0 lb

## 2017-12-05 DIAGNOSIS — T675XXA Heat exhaustion, unspecified, initial encounter: Secondary | ICD-10-CM

## 2017-12-05 NOTE — Patient Instructions (Signed)
Discussed rehydration with Gatorade and water. Stay out of the heat for 24-48 hours. Let me know if new symptoms or not improving. Consider getting a physical this year.

## 2017-12-05 NOTE — Progress Notes (Signed)
  Subjective:     Patient ID: Darius Parker, male   DOB: 08-29-1945, 72 y.o.   MRN: [id number] Chief Complaint  Patient presents with  . Diarrhea    Started 11/30/2017; has resloved but still feeling bad.  No appetite, indigestion.  . Cough    Started this morning.     HPI States his stool was formed today. He admits to mowing lawns in the heat over the last few days. Reports cough x one today and felt he had a mucus plug which cleared.  Review of Systems     Objective:   Physical Exam  Constitutional: He appears well-developed and well-nourished. No distress.  Pulmonary/Chest: Breath sounds normal.  Abdominal: Soft. Bowel sounds are normal. There is no tenderness. There is no guarding.  Musculoskeletal: He exhibits no edema (of lower extremities).       Assessment:    1. Heat exhaustion, initial encounter    Plan:    Discussed rehydration and staying out of the heat for 24-48 hours. Encouraged scheduling a physical this year.

## 2017-12-19 DIAGNOSIS — I5022 Chronic systolic (congestive) heart failure: Secondary | ICD-10-CM | POA: Diagnosis not present

## 2017-12-19 DIAGNOSIS — E782 Mixed hyperlipidemia: Secondary | ICD-10-CM | POA: Diagnosis not present

## 2017-12-19 DIAGNOSIS — I10 Essential (primary) hypertension: Secondary | ICD-10-CM | POA: Diagnosis not present

## 2017-12-19 DIAGNOSIS — I2510 Atherosclerotic heart disease of native coronary artery without angina pectoris: Secondary | ICD-10-CM | POA: Diagnosis not present

## 2018-02-08 ENCOUNTER — Other Ambulatory Visit: Payer: Self-pay

## 2018-02-08 ENCOUNTER — Emergency Department: Payer: PPO

## 2018-02-08 ENCOUNTER — Inpatient Hospital Stay
Admission: EM | Admit: 2018-02-08 | Discharge: 2018-02-09 | DRG: 176 | Disposition: A | Discharge status: Home / Self Care | Payer: PPO | Attending: Internal Medicine | Admitting: Internal Medicine

## 2018-02-08 DIAGNOSIS — E782 Mixed hyperlipidemia: Secondary | ICD-10-CM | POA: Diagnosis not present

## 2018-02-08 DIAGNOSIS — I5022 Chronic systolic (congestive) heart failure: Secondary | ICD-10-CM | POA: Diagnosis not present

## 2018-02-08 DIAGNOSIS — J984 Other disorders of lung: Secondary | ICD-10-CM | POA: Diagnosis not present

## 2018-02-08 DIAGNOSIS — J189 Pneumonia, unspecified organism: Secondary | ICD-10-CM | POA: Diagnosis present

## 2018-02-08 DIAGNOSIS — I110 Hypertensive heart disease with heart failure: Secondary | ICD-10-CM | POA: Diagnosis present

## 2018-02-08 DIAGNOSIS — I10 Essential (primary) hypertension: Secondary | ICD-10-CM | POA: Diagnosis present

## 2018-02-08 DIAGNOSIS — Z7982 Long term (current) use of aspirin: Secondary | ICD-10-CM | POA: Diagnosis not present

## 2018-02-08 DIAGNOSIS — I2699 Other pulmonary embolism without acute cor pulmonale: Principal | ICD-10-CM | POA: Diagnosis present

## 2018-02-08 DIAGNOSIS — M1710 Unilateral primary osteoarthritis, unspecified knee: Secondary | ICD-10-CM | POA: Diagnosis present

## 2018-02-08 DIAGNOSIS — I2510 Atherosclerotic heart disease of native coronary artery without angina pectoris: Secondary | ICD-10-CM | POA: Diagnosis present

## 2018-02-08 HISTORY — DX: Pneumonia, unspecified organism: J18.9

## 2018-02-08 HISTORY — DX: Other pulmonary embolism without acute cor pulmonale: I26.99

## 2018-02-08 LAB — COMPREHENSIVE METABOLIC PANEL
ALT: 16 U/L (ref 0–44)
ANION GAP: 8 (ref 5–15)
AST: 20 U/L (ref 15–41)
Albumin: 3.9 g/dL (ref 3.5–5.0)
Alkaline Phosphatase: 84 U/L (ref 38–126)
BUN: 31 mg/dL — ABNORMAL HIGH (ref 8–23)
CHLORIDE: 108 mmol/L (ref 98–111)
CO2: 23 mmol/L (ref 22–32)
CREATININE: 1.61 mg/dL — AB (ref 0.61–1.24)
Calcium: 8.8 mg/dL — ABNORMAL LOW (ref 8.9–10.3)
GFR calc Af Amer: 47 mL/min — ABNORMAL LOW (ref >60)
GFR, EST NON AFRICAN AMERICAN: 41 mL/min — AB (ref >60)
Glucose, Bld: 115 mg/dL — ABNORMAL HIGH (ref 70–99)
POTASSIUM: 4 mmol/L (ref 3.5–5.1)
Sodium: 139 mmol/L (ref 135–145)
Total Bilirubin: 1 mg/dL (ref 0.3–1.2)
Total Protein: 7.2 g/dL (ref 6.5–8.1)

## 2018-02-08 LAB — CBC WITH DIFFERENTIAL/PLATELET
BASOS ABS: 0.1 10*3/uL (ref 0–0.1)
BASOS PCT: 1 %
Eosinophils Absolute: 0.4 10*3/uL (ref 0–0.7)
Eosinophils Relative: 3 %
HEMATOCRIT: 36.5 % — AB (ref 40.0–52.0)
Hemoglobin: 12.7 g/dL — ABNORMAL LOW (ref 13.0–18.0)
Lymphocytes Relative: 16 %
Lymphs Abs: 1.9 10*3/uL (ref 1.0–3.6)
MCH: 31.3 pg (ref 26.0–34.0)
MCHC: 34.9 g/dL (ref 32.0–36.0)
MCV: 89.6 fL (ref 80.0–100.0)
MONO ABS: 1.1 10*3/uL — AB (ref 0.2–1.0)
Monocytes Relative: 9 %
NEUTROS ABS: 8.6 10*3/uL — AB (ref 1.4–6.5)
Neutrophils Relative %: 71 %
Platelets: 150 10*3/uL (ref 150–440)
RBC: 4.07 MIL/uL — ABNORMAL LOW (ref 4.40–5.90)
RDW: 12.8 % (ref 11.5–14.5)
WBC: 12.1 10*3/uL — ABNORMAL HIGH (ref 3.8–10.6)

## 2018-02-08 LAB — TROPONIN I

## 2018-02-08 LAB — FIBRIN DERIVATIVES D-DIMER (ARMC ONLY): FIBRIN DERIVATIVES D-DIMER (ARMC): 2296.73 ng{FEU}/mL — AB (ref 0.00–499.00)

## 2018-02-08 MED ORDER — SODIUM CHLORIDE 0.9 % IV SOLN
1.0000 g | Freq: Two times a day (BID) | INTRAVENOUS | Status: DC
  Administered 2018-02-08: 1 g via INTRAVENOUS
  Filled 2018-02-08 (×3): qty 10

## 2018-02-08 MED ORDER — SODIUM CHLORIDE 0.9 % IV SOLN
500.0000 mg | Freq: Once | INTRAVENOUS | Status: AC
  Administered 2018-02-09: 500 mg via INTRAVENOUS
  Filled 2018-02-08: qty 500

## 2018-02-08 MED ORDER — IOHEXOL 350 MG/ML SOLN
60.0000 mL | Freq: Once | INTRAVENOUS | Status: AC | PRN
  Administered 2018-02-08: 60 mL via INTRAVENOUS

## 2018-02-08 MED ORDER — APIXABAN 5 MG PO TABS
10.0000 mg | ORAL_TABLET | Freq: Two times a day (BID) | ORAL | Status: DC
  Administered 2018-02-08 – 2018-02-09 (×2): 10 mg via ORAL
  Filled 2018-02-08 (×2): qty 2

## 2018-02-08 NOTE — H&P (Signed)
[facility] NAME: Darius Parker    MR#:  [id number]  DATE OF BIRTH:  10/13/1945  DATE OF ADMISSION:  02/08/2018  PRIMARY CARE PHYSICIAN: Birdie Sons, MD   REQUESTING/REFERRING PHYSICIAN: Cinda Quest, MD  CHIEF COMPLAINT:   Chief Complaint  Patient presents with  . Chest Pain    HISTORY OF PRESENT ILLNESS:  Darius Parker  is a 72 y.o. male who presents with chief complaint as above.  Chest pain on the right lower side of his chest, as well as some shortness of breath for the past couple of days walking to his mailbox.  Here in the ED he had work-up done which showed right-sided PE, as well as some opacity concerning for possible pneumonia versus pulmonary infarct.  His white count was somewhat elevated.  He does state that he took somewhat long car trip to the beach several weeks ago.  No prior history of DVT, no complaint of recent leg swelling or leg pain.  Hospitalist were called for admission  PAST MEDICAL HISTORY:   Past Medical History:  Diagnosis Date  . Arthritis   . Bursitis   . Dysrhythmia   . Heart murmur   . Hepatitis   . Hypertension      PAST SURGICAL HISTORY:   Past Surgical History:  Procedure Laterality Date  . BACK SURGERY    . CARDIAC CATHETERIZATION    . CATARACT EXTRACTION W/PHACO Right 03/01/2016   Procedure: CATARACT EXTRACTION PHACO AND INTRAOCULAR LENS PLACEMENT ([place]);  Surgeon: Estill Cotta, MD;  Location: [facility] ORS;  Service: Ophthalmology;  Laterality: Right;  [place] 1.33 AP% 21.6 CDE 37.45 Fluid Pack Lot # [id number]  . COLONOSCOPY W/ POLYPECTOMY    . TONSILLECTOMY       SOCIAL HISTORY:   Social History   Tobacco Use  . Smoking status: Never Smoker  . Smokeless tobacco: Never Used  Substance Use Topics  . Alcohol use: No     FAMILY HISTORY:  Family history reviewed and is noncontributory   DRUG ALLERGIES:   Allergies  Allergen Reactions  . Tetanus Toxoids Swelling     MEDICATIONS AT HOME:   Prior to Admission medications   Medication Sig Start Date End Date Taking? Authorizing Provider  aspirin EC 81 MG tablet Take 81 mg by mouth daily.   Yes [provider]  atorvastatin (LIPITOR) 10 MG tablet Take 10 mg at bedtime by mouth. 01/21/17  Yes [provider]  carvedilol (COREG) 12.5 MG tablet Take 12.5 mg by mouth 2 (two) times daily with a meal.   Yes [provider]  enalapril (VASOTEC) 10 MG tablet Take 10 mg by mouth 2 (two) times daily.   Yes [provider]  furosemide (LASIX) 20 MG tablet Take 20 mg by mouth daily.   Yes [provider]  omega-3 acid ethyl esters (LOVAZA) 1 g capsule Take 1 capsule (1 g total) by mouth 2 (two) times daily. 11/29/15  Yes Carmon Ginsberg, PA    REVIEW OF SYSTEMS:  Review of Systems  Constitutional: Positive for malaise/fatigue. Negative for chills, fever and weight loss.  HENT: Negative for ear pain, hearing loss and tinnitus.   Eyes: Negative for blurred vision, double vision, pain and redness.  Respiratory: Positive for shortness of breath. Negative for cough and hemoptysis.   Cardiovascular: Positive for chest pain. Negative for palpitations, orthopnea and leg swelling.  Gastrointestinal: Negative for abdominal pain, constipation, diarrhea, nausea and vomiting.  Genitourinary: Negative for dysuria, frequency and hematuria.  Musculoskeletal: Negative for back pain, joint pain and neck pain.  Skin:       No acne, rash, or lesions  Neurological: Negative for dizziness, tremors, focal weakness and weakness.  Endo/Heme/Allergies: Negative for polydipsia. Does not bruise/bleed easily.  Psychiatric/Behavioral: Negative for depression. The patient is not nervous/anxious and does not have insomnia.      VITAL SIGNS:   Vitals:   02/08/18 1949 02/08/18 2230 02/08/18 2300  BP: 132/70 115/67 120/83  Pulse: 94 89 91  Resp: 16 (!) 21 (!) 21  Temp: 98.3 F (36.8 C)     TempSrc: Oral    SpO2: 100% 96% 98%  Weight: 81.6 kg    Height: 6\' 2"  (1.88 m)     Wt Readings from Last 3 Encounters:  02/08/18 81.6 kg  12/05/17 88.9 kg  03/27/17 90.9 kg    PHYSICAL EXAMINATION:  Physical Exam  Vitals reviewed. Constitutional: He is oriented to person, place, and time. He appears well-developed and well-nourished. No distress.  HENT:  Head: Normocephalic and atraumatic.  Mouth/Throat: Oropharynx is clear and moist.  Eyes: Pupils are equal, round, and reactive to light. Conjunctivae and EOM are normal. No scleral icterus.  Neck: Normal range of motion. Neck supple. No JVD present. No thyromegaly present.  Cardiovascular: Normal rate, regular rhythm and intact distal pulses. Exam reveals no gallop and no friction rub.  No murmur heard. Respiratory: Effort normal and breath sounds normal. No respiratory distress. He has no wheezes. He has no rales.  GI: Soft. Bowel sounds are normal. He exhibits no distension. There is no tenderness.  Musculoskeletal: Normal range of motion. He exhibits no edema.  No arthritis, no gout  Lymphadenopathy:    He has no cervical adenopathy.  Neurological: He is alert and oriented to person, place, and time. No cranial nerve deficit.  No dysarthria, no aphasia  Skin: Skin is warm and dry. No rash noted. No erythema.  Psychiatric: He has a normal mood and affect. His behavior is normal. Judgment and thought content normal.    LABORATORY PANEL:   CBC Recent Labs  Lab 02/08/18 2002  WBC 12.1*  HGB 12.7*  HCT 36.5*  PLT 150   ------------------------------------------------------------------------------------------------------------------  Chemistries  Recent Labs  Lab 02/08/18 2002  NA 139  K 4.0  CL 108  CO2 23  GLUCOSE 115*  BUN 31*  CREATININE 1.61*  CALCIUM 8.8*  AST 20  ALT 16  ALKPHOS 84  BILITOT 1.0    ------------------------------------------------------------------------------------------------------------------  Cardiac Enzymes Recent Labs  Lab 02/08/18 2002  TROPONINI <0.03   ------------------------------------------------------------------------------------------------------------------  RADIOLOGY:  Dg Chest 2 View  Result Date: 02/08/2018 CLINICAL DATA:  Right-sided rib pain after coughing. EXAM: CHEST - 2 VIEW COMPARISON:  07/20/2015 FINDINGS: The lungs are clear without focal pneumonia, edema, pneumothorax or pleural effusion. Single nodular densities over each lower lobe are similar to prior and compatible with nipple shadows. Interstitial markings are diffusely coarsened with chronic features. Calcified hilar lymph nodes again noted. The visualized bony structures of the thorax are intact. IMPRESSION: No active cardiopulmonary disease. Electronically Signed   By: Misty Stanley M.D.   On: 02/08/2018 20:23   Ct Angio Chest Pe W And/or Wo Contrast  Result Date: 02/08/2018 CLINICAL DATA:  Cough and right-sided rib pain. EXAM: CT ANGIOGRAPHY CHEST WITH CONTRAST TECHNIQUE: Multidetector CT imaging of the chest was performed using the standard protocol during bolus administration of intravenous contrast. Multiplanar CT image reconstructions and  MIPs were obtained to evaluate the vascular anatomy. CONTRAST:  94mL OMNIPAQUE IOHEXOL 350 MG/ML SOLN COMPARISON:  1608 chest CT FINDINGS: Cardiovascular: Positive pulmonary emboli to the right lower lobe noted within segmental and subsegmental branches. No right heart strain with RV/LV ratio 0.65. Cardiomegaly with left ventricular enlargement. No pericardial effusion. Atherosclerotic nonaneurysmal thoracic aorta. Mediastinum/Nodes: No enlarged mediastinal, hilar, or axillary lymph nodes. Calcified prevascular lymph nodes consistent with old granulomatous disease. Small subcentimeter right hilar lymph nodes are noted possibly reactive. Thyroid  gland, trachea, and esophagus demonstrate no significant findings. Lungs/Pleura: Trace right effusion with bibasilar right greater than left hazy opacities likely representing dependent atelectasis. Alveolitis/pneumonitis or early changes of pneumonia are not entirely excluded at the right lung base. Mild bronchial thickening and faint atelectatic change in the lingula. No pneumothorax. Upper Abdomen: Nonacute.  Splenic granulomata. Musculoskeletal: No acute nor aggressive osseous abnormality. Review of the MIP images confirms the above findings. IMPRESSION: 1. Acute right lower lobe pulmonary emboli without right heart strain. These results were called by telephone at the time of interpretation on 02/08/2018 at 10:18 pm to Dr. Conni Slipper , who verbally acknowledged these results. 2. Patchy airspace opacities right greater than left likely representing atelectatic change. Subtle changes of pneumonia and less likely pulmonary infarct would be among differential possibilities. 3. Cardiomegaly with mild left ventricular dilatation. 4. Minimal aortic atherosclerosis. 5. Calcified mediastinal lymph nodes and splenic calcifications compatible with old granulomatous disease. Aortic Atherosclerosis (ICD10-I70.0). Electronically Signed   By: Ashley Royalty M.D.   On: 02/08/2018 22:19    EKG:   Orders placed or performed during the hospital encounter of 02/08/18  . ED EKG within 10 minutes  . ED EKG within 10 minutes  . EKG 12-Lead  . EKG 12-Lead    IMPRESSION AND PLAN:  Principal Problem:   Pulmonary embolism ([place]) -started on Eliquis, no right heart strain seen on CT, will get an echocardiogram in the morning Active Problems:   CAP (community acquired pneumonia) -IV antibiotics initiated, supportive treatment PRN   Essential hypertension -stable, continue home meds   Chronic systolic heart failure ([place]) -continue home medications   Coronary artery disease involving native coronary artery of native heart  without angina pectoris -continue home meds   Hyperlipidemia, mixed -Home dose antilipid  Chart review performed and case discussed with ED provider. Labs, imaging and/or ECG reviewed by provider and discussed with patient/family. Management plans discussed with the patient and/or family.  DVT PROPHYLAXIS: Systemic anticoagulation  GI PROPHYLAXIS:  None  ADMISSION STATUS: Inpatient     CODE STATUS: Full  TOTAL TIME TAKING CARE OF THIS PATIENT: 45 minutes.   Mychal Durio [address] 02/08/2018, 11:36 PM  [facility]  Office  [phone]  CC: Primary care physician; Birdie Sons, MD  Note:  This document was prepared using Dragon voice recognition software and may include unintentional dictation errors.

## 2018-02-08 NOTE — ED Triage Notes (Addendum)
Pt states at noon today with coughing began to experience right sided rib pain to ruq. Pt points to lower aspect of anterior right rib cage. Pt denies shob, fever. Pt appears in no acute distress.

## 2018-02-08 NOTE — ED Provider Notes (Signed)
[facility] Emergency Department Provider Note   ____________________________________________   First MD Initiated Contact with Patient 02/08/18 2141     (approximate)  I have reviewed the triage vital signs and the nursing notes.   HISTORY  Chief Complaint Chest Pain    HPI Darius Parker is a 72 y.o. male patient complains of pain in the right lower part of the chest.  More lateral than anterior.  Started today after some coughing.  Is still hurting there now.  Patient still having a dry cough.  Some worsening of pain with deep breathing.  He is not short of breath is not having a fever the cough is again not productive.  He has not had this before.  He has not had any other injuries.  Patient has a history of heart disease hypertension heart failure.   Past Medical History:  Diagnosis Date  . Arthritis   . Bursitis   . Dysrhythmia   . Heart murmur   . Hepatitis   . Hypertension     Patient Active Problem List   Diagnosis Date Noted  . Essential hypertension 03/27/2017  . Chronic systolic heart failure ([place]) 03/27/2017  . Hyperlipidemia, mixed 03/27/2017  . Coronary artery disease involving native coronary artery of native heart without angina pectoris 03/27/2017    Past Surgical History:  Procedure Laterality Date  . BACK SURGERY    . CARDIAC CATHETERIZATION    . CATARACT EXTRACTION W/PHACO Right 03/01/2016   Procedure: CATARACT EXTRACTION PHACO AND INTRAOCULAR LENS PLACEMENT ([place]);  Surgeon: Estill Cotta, MD;  Location: [facility] ORS;  Service: Ophthalmology;  Laterality: Right;  [place] 1.33 AP% 21.6 CDE 37.45 Fluid Pack Lot # [id number]  . COLONOSCOPY W/ POLYPECTOMY    . TONSILLECTOMY      Prior to Admission medications   Medication Sig Start Date End Date Taking? Authorizing Provider  aspirin EC 81 MG tablet Take 81 mg by mouth daily.    [provider]  atorvastatin (LIPITOR) 10 MG tablet Take 10 mg at bedtime by mouth. 01/21/17    [provider]  carvedilol (COREG) 12.5 MG tablet Take 12.5 mg by mouth 2 (two) times daily with a meal.    [provider]  enalapril (VASOTEC) 10 MG tablet Take 10 mg by mouth 2 (two) times daily.    [provider]  furosemide (LASIX) 20 MG tablet Take 20 mg by mouth daily.    [provider]  omega-3 acid ethyl esters (LOVAZA) 1 g capsule Take 1 capsule (1 g total) by mouth 2 (two) times daily. 11/29/15   Carmon Ginsberg, PA    Allergies Tetanus toxoids  No family history on file.  Social History Social History   Tobacco Use  . Smoking status: Never Smoker  . Smokeless tobacco: Never Used  Substance Use Topics  . Alcohol use: No  . Drug use: Not on file    Review of Systems  Constitutional: No fever/chills Eyes: No visual changes. ENT: No sore throat. Cardiovascular:  chest pain. Respiratory: Denies shortness of breath. Gastrointestinal: No abdominal pain.  No nausea, no vomiting.  No diarrhea.  No constipation. Genitourinary: Negative for dysuria. Musculoskeletal: Negative for back pain. Skin: Negative for rash. Neurological: Negative for headaches, focal weakness   ____________________________________________   PHYSICAL EXAM:  VITAL SIGNS: ED Triage Vitals [02/08/18 1949]  Enc Vitals Group     BP 132/70     Pulse Rate 94     Resp 16  Temp 98.3 F (36.8 C)     Temp Source Oral     SpO2 100 %     Weight 180 lb (81.6 kg)     Height 6\' 2"  (1.88 m)     Head Circumference      Peak Flow      Pain Score 6     Pain Loc      Pain Edu?      Excl. in [place]?     Constitutional: Alert and oriented. Well appearing and in no acute distress. Eyes: Conjunctivae are normal.  Head: Atraumatic. Nose: No congestion/rhinnorhea. Mouth/Throat: Mucous membranes are moist.  Oropharynx non-erythematous. Neck: No stridor. Cardiovascular: Normal rate, regular rhythm. Grossly normal heart sounds.  Good peripheral  circulation. Respiratory: Normal respiratory effort.  No retractions. Lungs CTAB.  Chest wall is not tender Gastrointestinal: Soft and nontender. No distention. No abdominal bruits. No CVA tenderness. Musculoskeletal: No lower extremity tenderness nor edema.   Neurologic:  Normal speech and language. No gross focal neurologic deficits are appreciated.  Skin:  Skin is warm, dry and intact. No rash noted. Psychiatric: Mood and affect are normal. Speech and behavior are normal.  ____________________________________________   LABS (all labs ordered are listed, but only abnormal results are displayed)  Labs Reviewed  COMPREHENSIVE METABOLIC PANEL - Abnormal; Notable for the following components:      Result Value   Glucose, Bld 115 (*)    BUN 31 (*)    Creatinine, Ser 1.61 (*)    Calcium 8.8 (*)    GFR calc non Af Amer 41 (*)    GFR calc Af Amer 47 (*)    All other components within normal limits  FIBRIN DERIVATIVES D-DIMER ([facility] ONLY) - Abnormal; Notable for the following components:   Fibrin derivatives [facility]-dim[facility]) 2,296.73 (*)    All other components within normal limits  CBC WITH DIFFERENTIAL/PLATELET - Abnormal; Notable for the following components:   WBC 12.1 (*)    RBC 4.07 (*)    Hemoglobin 12.7 (*)    HCT 36.5 (*)    Neutro Abs 8.6 (*)    Monocytes Absolute 1.1 (*)    All other components within normal limits  CULTURE, BLOOD (ROUTINE X 2)  CULTURE, BLOOD (ROUTINE X 2)  TROPONIN I   ____________________________________________  EKG  EKG read interpreted by me shows normal sinus rhythm rate of 98 left axis patient has left bundle branch block with ST elevation but this is similar to EKG from 12 July 2015. ____________________________________________  [place]  ED MD interpretation: Chest x-ray read by radiology reviewed by me shows no acute disease  Official radiology report(s): Dg Chest 2 View  Result Date: 02/08/2018 CLINICAL DATA:  Right-sided rib pain  after coughing. EXAM: CHEST - 2 VIEW COMPARISON:  07/20/2015 FINDINGS: The lungs are clear without focal pneumonia, edema, pneumothorax or pleural effusion. Single nodular densities over each lower lobe are similar to prior and compatible with nipple shadows. Interstitial markings are diffusely coarsened with chronic features. Calcified hilar lymph nodes again noted. The visualized bony structures of the thorax are intact. IMPRESSION: No active cardiopulmonary disease. Electronically Signed   By: Misty Stanley M.D.   On: 02/08/2018 20:23   Ct Angio Chest Pe W And/or Wo Contrast  Result Date: 02/08/2018 CLINICAL DATA:  Cough and right-sided rib pain. EXAM: CT ANGIOGRAPHY CHEST WITH CONTRAST TECHNIQUE: Multidetector CT imaging of the chest was performed using the standard protocol during bolus administration of intravenous contrast. Multiplanar CT  image reconstructions and MIPs were obtained to evaluate the vascular anatomy. CONTRAST:  34mL OMNIPAQUE IOHEXOL 350 MG/ML SOLN COMPARISON:  1608 chest CT FINDINGS: Cardiovascular: Positive pulmonary emboli to the right lower lobe noted within segmental and subsegmental branches. No right heart strain with RV/LV ratio 0.65. Cardiomegaly with left ventricular enlargement. No pericardial effusion. Atherosclerotic nonaneurysmal thoracic aorta. Mediastinum/Nodes: No enlarged mediastinal, hilar, or axillary lymph nodes. Calcified prevascular lymph nodes consistent with old granulomatous disease. Small subcentimeter right hilar lymph nodes are noted possibly reactive. Thyroid gland, trachea, and esophagus demonstrate no significant findings. Lungs/Pleura: Trace right effusion with bibasilar right greater than left hazy opacities likely representing dependent atelectasis. Alveolitis/pneumonitis or early changes of pneumonia are not entirely excluded at the right lung base. Mild bronchial thickening and faint atelectatic change in the lingula. No pneumothorax. Upper Abdomen:  Nonacute.  Splenic granulomata. Musculoskeletal: No acute nor aggressive osseous abnormality. Review of the MIP images confirms the above findings. IMPRESSION: 1. Acute right lower lobe pulmonary emboli without right heart strain. These results were called by telephone at the time of interpretation on 02/08/2018 at 10:18 pm to Dr. Conni Slipper , who verbally acknowledged these results. 2. Patchy airspace opacities right greater than left likely representing atelectatic change. Subtle changes of pneumonia and less likely pulmonary infarct would be among differential possibilities. 3. Cardiomegaly with mild left ventricular dilatation. 4. Minimal aortic atherosclerosis. 5. Calcified mediastinal lymph nodes and splenic calcifications compatible with old granulomatous disease. Aortic Atherosclerosis (ICD10-I70.0). Electronically Signed   By: Ashley Royalty M.D.   On: 02/08/2018 22:19    ____________________________________________   PROCEDURES  Procedure(s) performed:   Procedures  Critical Care performed: Critical care time 1/2-hour.  This includes speaking with the hospitalist in detail and the radiologist and the patient twice and reviewing the x-ray films myself.  ____________________________________________   INITIAL IMPRESSION / ASSESSMENT AND PLAN / ED COURSE  Patient's d-dimer is 2296.  His troponin is negative.  We will get a CT angios to check and see if he has possibly had a PE.  This will also double check for any rib fractures he may have experienced from his cough.  Chest CT shows right lower lobe emboli with airspace disease.  I reviewed the films ----------------------------------------- 10:22 PM on 02/08/2018 -----------------------------------------  Etiology is calls back.  He has pulmonary emboli in the right lower lobe.  There is some airspace disease bilaterally in the bases worse on the right.  Patient does have a white count as well.  This could represent pneumonia or possibly  infarct.  There is no heart strain per radiology. We will put the patient in the hospital.  Discussed in detail with hospitalist.  Will start Eliquis.      ____________________________________________   FINAL CLINICAL IMPRESSION(S) / ED DIAGNOSES  Final diagnoses:  Acute pulmonary embolism without acute cor pulmonale, unspecified pulmonary embolism type [facility])     ED Discharge Orders    None       Note:  This document was prepared using Dragon voice recognition software and may include unintentional dictation errors.    Nena Polio, MD 02/08/18 2223

## 2018-02-09 ENCOUNTER — Inpatient Hospital Stay
Admit: 2018-02-09 | Discharge: 2018-02-09 | Disposition: A | Discharge status: Home / Self Care | Payer: PPO | Attending: Internal Medicine | Admitting: Internal Medicine

## 2018-02-09 ENCOUNTER — Other Ambulatory Visit: Payer: Self-pay

## 2018-02-09 LAB — BASIC METABOLIC PANEL
ANION GAP: 7 (ref 5–15)
BUN: 28 mg/dL — ABNORMAL HIGH (ref 8–23)
CALCIUM: 8.5 mg/dL — AB (ref 8.9–10.3)
CHLORIDE: 111 mmol/L (ref 98–111)
CO2: 22 mmol/L (ref 22–32)
CREATININE: 1.5 mg/dL — AB (ref 0.61–1.24)
GFR calc Af Amer: 52 mL/min — ABNORMAL LOW (ref >60)
GFR calc non Af Amer: 44 mL/min — ABNORMAL LOW (ref >60)
Glucose, Bld: 119 mg/dL — ABNORMAL HIGH (ref 70–99)
Potassium: 3.8 mmol/L (ref 3.5–5.1)
Sodium: 140 mmol/L (ref 135–145)

## 2018-02-09 LAB — CBC
HCT: 33.1 % — ABNORMAL LOW (ref 40.0–52.0)
HEMOGLOBIN: 11.7 g/dL — AB (ref 13.0–18.0)
MCH: 31.5 pg (ref 26.0–34.0)
MCHC: 35.3 g/dL (ref 32.0–36.0)
MCV: 89.1 fL (ref 80.0–100.0)
Platelets: 126 10*3/uL — ABNORMAL LOW (ref 150–440)
RBC: 3.72 MIL/uL — ABNORMAL LOW (ref 4.40–5.90)
RDW: 13 % (ref 11.5–14.5)
WBC: 11 10*3/uL — ABNORMAL HIGH (ref 3.8–10.6)

## 2018-02-09 LAB — ECHOCARDIOGRAM COMPLETE
Height: 74 in
WEIGHTICAEL: 3054.4 [oz_av]

## 2018-02-09 MED ORDER — CARVEDILOL 12.5 MG PO TABS: 12.5000 mg | ORAL_TABLET | Freq: Two times a day (BID) | ORAL | Status: DC

## 2018-02-09 MED ORDER — APIXABAN 5 MG PO TABS: ORAL_TABLET | ORAL | 2 refills | Status: DC

## 2018-02-09 MED ORDER — ACETAMINOPHEN 325 MG PO TABS: 650.0000 mg | ORAL_TABLET | Freq: Four times a day (QID) | ORAL | Status: DC | PRN

## 2018-02-09 MED ORDER — PERFLUTREN LIPID MICROSPHERE
9.0000 mL | INTRAVENOUS | Status: AC | PRN
  Administered 2018-02-09: 9 mL via INTRAVENOUS
  Filled 2018-02-09: qty 10

## 2018-02-09 MED ORDER — LEVOFLOXACIN 500 MG PO TABS: 500.0000 mg | ORAL_TABLET | Freq: Every day | ORAL | Status: DC

## 2018-02-09 MED ORDER — ATORVASTATIN CALCIUM 10 MG PO TABS
10.0000 mg | ORAL_TABLET | Freq: Every day | ORAL | Status: DC
  Filled 2018-02-09: qty 1

## 2018-02-09 MED ORDER — ACETAMINOPHEN 650 MG RE SUPP: 650.0000 mg | Freq: Four times a day (QID) | RECTAL | Status: DC | PRN

## 2018-02-09 MED ORDER — ENALAPRIL MALEATE 10 MG PO TABS
10.0000 mg | ORAL_TABLET | Freq: Two times a day (BID) | ORAL | Status: DC
  Filled 2018-02-09: qty 1

## 2018-02-09 MED ORDER — AMOXICILLIN-POT CLAVULANATE 875-125 MG PO TABS: 1.0000 | ORAL_TABLET | Freq: Two times a day (BID) | ORAL | 0 refills | Status: DC

## 2018-02-09 MED ORDER — CARVEDILOL 12.5 MG PO TABS
12.5000 mg | ORAL_TABLET | Freq: Two times a day (BID) | ORAL | Status: DC
  Administered 2018-02-09: 12.5 mg via ORAL
  Filled 2018-02-09 (×2): qty 1

## 2018-02-09 MED ORDER — ENALAPRIL MALEATE 10 MG PO TABS
10.0000 mg | ORAL_TABLET | Freq: Two times a day (BID) | ORAL | Status: DC
  Administered 2018-02-09: 10 mg via ORAL
  Filled 2018-02-09 (×3): qty 1

## 2018-02-09 MED ORDER — OXYCODONE HCL 5 MG PO TABS: 5.0000 mg | ORAL_TABLET | ORAL | Status: DC | PRN

## 2018-02-09 MED ORDER — ONDANSETRON HCL 4 MG PO TABS: 4.0000 mg | ORAL_TABLET | Freq: Four times a day (QID) | ORAL | Status: DC | PRN

## 2018-02-09 MED ORDER — AMOXICILLIN-POT CLAVULANATE 875-125 MG PO TABS
1.0000 | ORAL_TABLET | Freq: Two times a day (BID) | ORAL | Status: DC
  Administered 2018-02-09: 1 via ORAL
  Filled 2018-02-09: qty 1

## 2018-02-09 MED ORDER — FUROSEMIDE 20 MG PO TABS
20.0000 mg | ORAL_TABLET | Freq: Every day | ORAL | Status: DC
  Administered 2018-02-09: 20 mg via ORAL
  Filled 2018-02-09: qty 1

## 2018-02-09 MED ORDER — ONDANSETRON HCL 4 MG/2ML IJ SOLN: 4.0000 mg | Freq: Four times a day (QID) | INTRAMUSCULAR | Status: DC | PRN

## 2018-02-09 NOTE — Progress Notes (Signed)
RN removed IV.  Patient understood instructions and information.  Patient left in a private vehicle.  Phillis Knack, RN

## 2018-02-09 NOTE — Discharge Instructions (Signed)
Patient advised to watch for blood in urine, blood in stool and be cautious while using sharps at home since he is on blood thinner medication

## 2018-02-09 NOTE — Care Management (Signed)
Eliquis coupon given 

## 2018-02-09 NOTE — Discharge Summary (Signed)
[place] [facility] [place] NAME: Darius Parker    MR#:  [id number]  DATE OF BIRTH:  March 30, 1946  DATE OF ADMISSION:  02/08/2018 ADMITTING PHYSICIAN: Sela Hua, MD  DATE OF DISCHARGE: 02/09/2018  PRIMARY CARE PHYSICIAN: Birdie Sons, MD    ADMISSION DIAGNOSIS:  Acute pulmonary embolism without acute cor pulmonale, unspecified pulmonary embolism type ([place]) [I26.99]  DISCHARGE DIAGNOSIS:  acute right side of pulmonary embolism  SECONDARY DIAGNOSIS:   Past Medical History:  Diagnosis Date  . Arthritis   . Bursitis   . Dysrhythmia   . Heart murmur   . Hepatitis   . Hypertension     HOSPITAL COURSE:  Darius Parker  is a 72 y.o. male who presents with chief complaint as above.  Chest pain on the right lower side of his chest, as well as some shortness of breath for the past couple of days walking to his mailbox.  Here in the ED he had work-up done which showed right-sided PE, as well as some opacity concerning for possible pneumonia versus pulmonary infarct.  His white count was somewhat elevated.  He does state that he took somewhat long car trip to the beach several weeks ago  1.Pulmonary embolism ([place]) -unprovoked  -recent trip/car ride which was long to the beach started on Eliquis, no right heart strain seen on CT,  -echo shows no right heart strain. EF remains reduced similar to echo of one year ago about 25% -patient given coupon for eliquis. -Given some education regarding the same -follow-up with primary care physician as outpatient -history of DVT or leg swelling or leg cramps.   2. Lexuses versus pulmonary infarct noted on CT chest with mild elevated white count will cover with antibiotic   3.  Essential hypertension -stable, continue home meds    4.Chronic systolic heart failure ([place]) -continue home medications    5.Coronary artery disease involving native coronary artery of native heart without angina pectoris  - continue home meds  6.  Hyperlipidemia, mixed -Home dose antilipid  Ambulated around the nurses station without any difficulty. He has DJD in his knee which bothered him however his stats remain stable do not have any chest pain or any respiratory distress. Discussed with him he is okay to discharge home with outpatient follow-up with Dr. Caryn Section  CONSULTS OBTAINED:    DRUG ALLERGIES:   Allergies  Allergen Reactions  . Tetanus Toxoids Swelling    DISCHARGE MEDICATIONS:   Allergies as of 02/09/2018      Reactions   Tetanus Toxoids Swelling      Medication List    TAKE these medications   amoxicillin-clavulanate 875-125 MG tablet Commonly known as:  AUGMENTIN Take 1 tablet by mouth every 12 (twelve) hours.   apixaban 5 MG Tabs tablet Commonly known as:  ELIQUIS Take (2 tabs)10 mg twice a day for 7 days and then 5 (1 tab) mg twice a day   aspirin EC 81 MG tablet Take 81 mg by mouth daily.   atorvastatin 10 MG tablet Commonly known as:  LIPITOR Take 10 mg at bedtime by mouth.   carvedilol 12.5 MG tablet Commonly known as:  COREG Take 12.5 mg by mouth 2 (two) times daily with a meal.   enalapril 10 MG tablet Commonly known as:  VASOTEC Take 10 mg by mouth 2 (two) times daily.   furosemide 20 MG tablet Commonly known as:  LASIX Take 20 mg by mouth daily.  omega-3 acid ethyl esters 1 g capsule Commonly known as:  LOVAZA Take 1 capsule (1 g total) by mouth 2 (two) times daily.       If you experience worsening of your admission symptoms, develop shortness of breath, life threatening emergency, suicidal or homicidal thoughts you must seek medical attention immediately by calling 911 or calling your MD immediately  if symptoms less severe.  You Must read complete instructions/literature along with all the possible adverse reactions/side effects for all the Medicines you take and that have been prescribed to you. Take any new Medicines after you have completely  understood and accept all the possible adverse reactions/side effects.   Please note  You were cared for by a hospitalist during your hospital stay. If you have any questions about your discharge medications or the care you received while you were in the hospital after you are discharged, you can call the unit and asked to speak with the hospitalist on call if the hospitalist that took care of you is not available. Once you are discharged, your primary care physician will handle any further medical issues. Please note that NO REFILLS for any discharge medications will be authorized once you are discharged, as it is imperative that you return to your primary care physician (or establish a relationship with a primary care physician if you do not have one) for your aftercare needs so that they can reassess your need for medications and monitor your lab values. Today   SUBJECTIVE   Doing well. Cp pain better  VITAL SIGNS:  Blood pressure (!) 97/58, pulse 88, temperature 98.7 F (37.1 C), temperature source Oral, resp. rate 16, height 6\' 2"  (1.88 m), weight 86.6 kg, SpO2 94 %.  I/O:    Intake/Output Summary (Last 24 hours) at 02/09/2018 1448 Last data filed at 02/08/2018 2355 Gross per 24 hour  Intake 99.93 ml  Output -  Net 99.93 ml    PHYSICAL EXAMINATION:  GENERAL:  72 y.o.-year-old patient lying in the bed with no acute distress.  EYES: Pupils equal, round, reactive to light and accommodation. No scleral icterus. Extraocular muscles intact.  HEENT: Head atraumatic, normocephalic. Oropharynx and nasopharynx clear.  NECK:  Supple, no jugular venous distention. No thyroid enlargement, no tenderness.  LUNGS: Normal breath sounds bilaterally, no wheezing, rales,rhonchi or crepitation. No use of accessory muscles of respiration.  CARDIOVASCULAR: S1, S2 normal. No murmurs, rubs, or gallops.  ABDOMEN: Soft, non-tender, non-distended. Bowel sounds present. No organomegaly or mass.  EXTREMITIES:  No pedal edema, cyanosis, or clubbing.  NEUROLOGIC: Cranial nerves II through XII are intact. Muscle strength 5/5 in all extremities. Sensation intact. Gait not checked.  PSYCHIATRIC: The patient is alert and oriented x 3.  SKIN: No obvious rash, lesion, or ulcer.   DATA REVIEW:   CBC  Recent Labs  Lab 02/09/18 0443  WBC 11.0*  HGB 11.7*  HCT 33.1*  PLT 126*    Chemistries  Recent Labs  Lab 02/08/18 2002 02/09/18 0443  NA 139 140  K 4.0 3.8  CL 108 111  CO2 23 22  GLUCOSE 115* 119*  BUN 31* 28*  CREATININE 1.61* 1.50*  CALCIUM 8.8* 8.5*  AST 20  --   ALT 16  --   ALKPHOS 84  --   BILITOT 1.0  --     Microbiology Results   Recent Results (from the past 240 hour(s))  Culture, blood (routine x 2)     Status: None (Preliminary result)   Collection  Time: 02/08/18 11:16 PM  Result Value Ref Range Status   Specimen Description BLOOD RIGHT FATTY CASTS  Final   Special Requests   Final    BOTTLES DRAWN AEROBIC AND ANAEROBIC Blood Culture adequate volume   Culture   Final    NO GROWTH < 12 HOURS Performed at [facility], [address], [place], [place] [address]    Report Status PENDING  Incomplete  Culture, blood (routine x 2)     Status: None (Preliminary result)   Collection Time: 02/08/18 11:16 PM  Result Value Ref Range Status   Specimen Description BLOOD RIGHT HAND  Final   Special Requests   Final    BOTTLES DRAWN AEROBIC AND ANAEROBIC Blood Culture results may not be optimal due to an inadequate volume of blood received in culture bottles   Culture   Final    NO GROWTH < 12 HOURS Performed at [facility], [address] [place], [place] [address]    Report Status PENDING  Incomplete    RADIOLOGY:  Dg Chest 2 View  Result Date: 02/08/2018 CLINICAL DATA:  Right-sided rib pain after coughing. EXAM: CHEST - 2 VIEW COMPARISON:  07/20/2015 FINDINGS: The lungs are clear without focal pneumonia, edema, pneumothorax or pleural effusion.  Single nodular densities over each lower lobe are similar to prior and compatible with nipple shadows. Interstitial markings are diffusely coarsened with chronic features. Calcified hilar lymph nodes again noted. The visualized bony structures of the thorax are intact. IMPRESSION: No active cardiopulmonary disease. Electronically Signed   By: Misty Stanley M.D.   On: 02/08/2018 20:23   Ct Angio Chest Pe W And/or Wo Contrast  Result Date: 02/08/2018 CLINICAL DATA:  Cough and right-sided rib pain. EXAM: CT ANGIOGRAPHY CHEST WITH CONTRAST TECHNIQUE: Multidetector CT imaging of the chest was performed using the standard protocol during bolus administration of intravenous contrast. Multiplanar CT image reconstructions and MIPs were obtained to evaluate the vascular anatomy. CONTRAST:  22mL OMNIPAQUE IOHEXOL 350 MG/ML SOLN COMPARISON:  1608 chest CT FINDINGS: Cardiovascular: Positive pulmonary emboli to the right lower lobe noted within segmental and subsegmental branches. No right heart strain with RV/LV ratio 0.65. Cardiomegaly with left ventricular enlargement. No pericardial effusion. Atherosclerotic nonaneurysmal thoracic aorta. Mediastinum/Nodes: No enlarged mediastinal, hilar, or axillary lymph nodes. Calcified prevascular lymph nodes consistent with old granulomatous disease. Small subcentimeter right hilar lymph nodes are noted possibly reactive. Thyroid gland, trachea, and esophagus demonstrate no significant findings. Lungs/Pleura: Trace right effusion with bibasilar right greater than left hazy opacities likely representing dependent atelectasis. Alveolitis/pneumonitis or early changes of pneumonia are not entirely excluded at the right lung base. Mild bronchial thickening and faint atelectatic change in the lingula. No pneumothorax. Upper Abdomen: Nonacute.  Splenic granulomata. Musculoskeletal: No acute nor aggressive osseous abnormality. Review of the MIP images confirms the above findings. IMPRESSION:  1. Acute right lower lobe pulmonary emboli without right heart strain. These results were called by telephone at the time of interpretation on 02/08/2018 at 10:18 pm to Dr. Conni Slipper , who verbally acknowledged these results. 2. Patchy airspace opacities right greater than left likely representing atelectatic change. Subtle changes of pneumonia and less likely pulmonary infarct would be among differential possibilities. 3. Cardiomegaly with mild left ventricular dilatation. 4. Minimal aortic atherosclerosis. 5. Calcified mediastinal lymph nodes and splenic calcifications compatible with old granulomatous disease. Aortic Atherosclerosis (ICD10-I70.0). Electronically Signed   By: Ashley Royalty M.D.   On: 02/08/2018 22:19     Management plans  discussed with the patient, family and they are in agreement.  CODE STATUS:     Code Status Orders  (From admission, onward)         Start     Ordered   02/09/18 0052  Full code  Continuous     02/09/18 0051        Code Status History    This patient has a current code status but no historical code status.      TOTAL TIME TAKING CARE OF THIS PATIENT: *40* minutes.    Fritzi Mandes M.D on 02/09/2018 at 2:48 PM  Between 7am to 6pm - Pager - [phone] After 6pm go to [email or web address] - password [email or web address] [place] [facility]  Office  [phone]  CC: Primary care physician; Birdie Sons, MD

## 2018-02-09 NOTE — Progress Notes (Signed)
Patient was able to ambulate around the nurses' station.  Patient reported that his knee was warming up as he went and felt better with movement.  Phillis Knack, RN

## 2018-02-11 ENCOUNTER — Telehealth: Payer: Self-pay | Admitting: Family Medicine

## 2018-02-11 NOTE — Telephone Encounter (Signed)
Please advise 

## 2018-02-11 NOTE — Telephone Encounter (Signed)
In most cases, you should stop aspirin when starting Eliquis. But he should check with his cardiologist to make sure since the aspirin is for heart disease.

## 2018-02-11 NOTE — Telephone Encounter (Signed)
Patient advised. Appointment scheduled for tomorrow at 8:40am.

## 2018-02-11 NOTE — Telephone Encounter (Signed)
Pt calling about his discharge from the hospital on Sun.  He was given Eliquis to take for blood clots.  Pt is asking if he should be taking the Eliquis along with the baby Asprin he was told to take by Dr. Caryn Section. Pt is also coughing up a little blood in his phlegm.   Please advise.  Thanks, [employer]

## 2018-02-11 NOTE — Telephone Encounter (Signed)
Patient was advised. Patient also stated he has been seeing pink color in sputum when he coughs. Patient wanted to know if he should be concerned about this?

## 2018-02-11 NOTE — Telephone Encounter (Signed)
He needs to make a follow up appointment to have this checked out today or tomorrow. He should stop aspirin for the time being.   Go to ER if she has shortness of breath, chest pain, or coughing up dark blood.

## 2018-02-12 ENCOUNTER — Ambulatory Visit (INDEPENDENT_AMBULATORY_CARE_PROVIDER_SITE_OTHER): Payer: PPO | Admitting: Family Medicine

## 2018-02-12 ENCOUNTER — Encounter: Payer: Self-pay | Admitting: Family Medicine

## 2018-02-12 VITALS — BP 95/63 | HR 87 | Temp 97.7°F | Resp 16 | Wt 195.0 lb

## 2018-02-12 DIAGNOSIS — R042 Hemoptysis: Secondary | ICD-10-CM

## 2018-02-12 DIAGNOSIS — R059 Cough, unspecified: Secondary | ICD-10-CM

## 2018-02-12 DIAGNOSIS — I2699 Other pulmonary embolism without acute cor pulmonale: Secondary | ICD-10-CM

## 2018-02-12 DIAGNOSIS — R05 Cough: Secondary | ICD-10-CM | POA: Diagnosis not present

## 2018-02-12 NOTE — Patient Instructions (Signed)
   Go to the [facility] on [address] for chest Xray at about 9am on Friday September 27th

## 2018-02-12 NOTE — Progress Notes (Signed)
Patient: Darius Parker Male    DOB: 1945/10/19   72 y.o.   MRN: [id number] Visit Date: 02/12/2018  Today's Provider: Lelon Huh, MD   Chief Complaint  Patient presents with  . Cough   Subjective:    Cough  This is a new problem. The current episode started in the past 7 days. The problem has been unchanged. Associated symptoms include hemoptysis (sputum is pink colored), nasal congestion, rhinorrhea and sweats (in the morinings). Pertinent negatives include no ear congestion, ear pain, fever, headaches, sore throat or shortness of breath. Treatments tried: Oral antibiotic.    Follow Hospitalization  Patient was admitted for right lower lobe pulmonary embolism on 02/08/2018 and was discharged on 02/09/2018. He reports good compliance with treatment. He reports this condition is Improved.  ------------------------------------------------------------------------------------  Has been coughing up blood which started prior to recent hospitalization.  Cough unchanged.since hospitalization. He was discharged on Augmentin. He had some right hilar lymph nodes on CT thought possibly to be reactive, as well as bilateral hazy basilar opacities and mild bronchial thickening.     Allergies  Allergen Reactions  . Tetanus Toxoids Swelling     Current Outpatient Medications:  .  amoxicillin-clavulanate (AUGMENTIN) 875-125 MG tablet, Take 1 tablet by mouth every 12 (twelve) hours., Disp: 12 tablet, Rfl: 0 .  apixaban (ELIQUIS) 5 MG TABS tablet, Take (2 tabs)10 mg twice a day for 7 days and then 5 (1 tab) mg twice a day, Disp: 60 tablet, Rfl: 2 .  atorvastatin (LIPITOR) 10 MG tablet, Take 10 mg at bedtime by mouth., Disp: , Rfl: 0 .  carvedilol (COREG) 12.5 MG tablet, Take 12.5 mg by mouth 2 (two) times daily with a meal., Disp: , Rfl:  .  enalapril (VASOTEC) 10 MG tablet, Take 10 mg by mouth 2 (two) times daily., Disp: , Rfl:  .  furosemide (LASIX) 20 MG tablet, Take 20 mg by mouth  daily., Disp: , Rfl:  .  omega-3 acid ethyl esters (LOVAZA) 1 g capsule, Take 1 capsule (1 g total) by mouth 2 (two) times daily., Disp: 60 capsule, Rfl: 5 .  aspirin EC 81 MG tablet, Take 81 mg by mouth daily., Disp: , Rfl:   Review of Systems  Constitutional: Negative for fever.  HENT: Positive for congestion and rhinorrhea. Negative for ear pain and sore throat.   Respiratory: Positive for cough and hemoptysis (sputum is pink colored). Negative for shortness of breath.   Neurological: Negative for headaches.    Social History   Tobacco Use  . Smoking status: Never Smoker  . Smokeless tobacco: Never Used  Substance Use Topics  . Alcohol use: No   Objective:   BP 95/63 (BP Location: Right Arm, Patient Position: Sitting, Cuff Size: Normal)   Pulse 87   Temp 97.7 F (36.5 C) (Oral)   Resp 16   Wt 195 lb (88.5 kg)   SpO2 97% Comment: room air  BMI 25.04 kg/m  Vitals:   02/12/18 0838  BP: 95/63  Pulse: 87  Resp: 16  Temp: 97.7 F (36.5 C)  TempSrc: Oral  SpO2: 97%  Weight: 195 lb (88.5 kg)     Physical Exam  General Appearance:    Alert, cooperative, no distress  HENT:   ENT exam normal, no neck nodes or sinus tenderness  Eyes:    PERRL, conjunctiva/corneas clear, EOM's intact       Lungs:     Bibasilar crackles, respirations unlabored  Heart:    Regular rate and rhythm  Neurologic:   Awake, alert, oriented x 3. No apparent focal neurological           defect.           Assessment & Plan:     1. Hemoptysis This was actually present before admission and treatment for PE last week. It is no worse now. He did have some changes on CT suggestive of lower respiratory infection, but may be secondary to PE or CHF. He is on third day of Augmentin, will check xr in 3 days as below. No signs of CHF exacerbation. He does have follow up with cardiology next week. Advised to hold aspirin while on Eliquis unless his cardiologist tells him differently.   2. Cough Stable and  persistent.  - DG Chest 2 View; Future to be done on 02/15/2018  3. Other pulmonary embolism without acute cor pulmonale, unspecified chronicity (HCC) Stable on Eliquis.  - DG Chest 2 View; Future       Lelon Huh, MD  [facility]

## 2018-02-13 LAB — CULTURE, BLOOD (ROUTINE X 2)
CULTURE: NO GROWTH
Culture: NO GROWTH
SPECIAL REQUESTS: ADEQUATE

## 2018-02-15 ENCOUNTER — Ambulatory Visit
Admission: RE | Admit: 2018-02-15 | Discharge: 2018-02-15 | Disposition: A | Discharge status: Home / Self Care | Payer: PPO | Source: Ambulatory Visit | Attending: Family Medicine | Admitting: Family Medicine

## 2018-02-15 ENCOUNTER — Other Ambulatory Visit: Payer: Self-pay

## 2018-02-15 DIAGNOSIS — J9811 Atelectasis: Secondary | ICD-10-CM | POA: Insufficient documentation

## 2018-02-15 DIAGNOSIS — R059 Cough, unspecified: Secondary | ICD-10-CM

## 2018-02-15 DIAGNOSIS — R05 Cough: Secondary | ICD-10-CM

## 2018-02-15 DIAGNOSIS — I2699 Other pulmonary embolism without acute cor pulmonale: Secondary | ICD-10-CM

## 2018-02-15 NOTE — Patient Outreach (Signed)
[facility]) Care Management  02/15/2018  Darius Parker 1945/07/05 [id number]  EMMI: general discharge red alert Referral date: 02/15/18 Referral reason: Know who to call about changes: no Insurance: HTA Day # 4 Attempt #1   Telephone call to patient regarding referral. Unable to reach patient. HIPAA compliant voice message left with call back phone number.   PLAN: RNCM will attempt 2nd telephone call to patient within 4 business days. RNCM will send outreach letter.   Quinn Plowman RN,BSN, [place] Telephonic  [phone]

## 2018-02-18 ENCOUNTER — Other Ambulatory Visit: Payer: Self-pay

## 2018-02-18 ENCOUNTER — Telehealth: Payer: Self-pay

## 2018-02-18 DIAGNOSIS — R042 Hemoptysis: Secondary | ICD-10-CM

## 2018-02-18 NOTE — Patient Outreach (Signed)
[facility]) Care Management  02/18/2018  MURLIN SCHRIEBER 05/11/1946 [id number]  EMMI: general discharge red alert Referral date: 02/16/28 Referral reason: know who to call about changes in condition: no Insurance:  Health team advantage Day # 4  Telephone call to patient regarding EMM general discharge red alert. HIPAA verified with patient. Explained reason for call. Patient states he knows to call his doctor if he is having any problems. Patient states he has seen his primary MD since discharge from the hospital.  Patient states he had a blood clot and a pneumonia. Patient reports her has completed his antibiotic course. He states he has a repeat chest xray on Friday 02/15/18 that was fine.  Patient states he has a follow up appointment with his cardiologist on 02/19/18.  He reports his doctor is referring him to a pulmonologist.  Patient inquired what does a pulmonologist do.  RNCM explained a pulmonologist to patient. Patient verbalized understanding. Patient states he is able to drive to his appointments.  Patient reports he continues to take his blood thinners as prescribed. RNCM discussed signs/ symptoms of bleeding.  Advised patient to follow up with his doctor as soon as possible for symptoms of bleeding. Advised to see emergency care for more severe symptoms. Patient verbalized understanding.  Patient denies any further concerns/ questions.  RNCM provided patient contact phone number for 24 hour nurse advise line. RNCM offered to send patient [facility] care management brochure/ magnet. Patient verbally agreed.   PLAN: RNCM will close patient due to patient being assessed and having no further needs.  RNCM will send patient [facility] care management brochure/ magnet RNCM will send patients primary MD closure letter.   Quinn Plowman RN,BSN,CCM [facility] Telephonic  [phone]

## 2018-02-18 NOTE — Telephone Encounter (Signed)
Pt advised.  He agreed to the referral to pulmonary.    Thanks,   -Mickel Baas

## 2018-02-18 NOTE — Addendum Note (Signed)
Addended by: Ashley Royalty E on: 02/18/2018 09:59 AM   Modules accepted: Orders

## 2018-02-18 NOTE — Telephone Encounter (Signed)
-----   Message from Birdie Sons, MD sent at 02/15/2018  1:40 PM EDT ----- Chest xray is normal. He needs referral to pulmonary for hemoptysis.

## 2018-02-19 ENCOUNTER — Ambulatory Visit: Payer: PPO

## 2018-02-19 DIAGNOSIS — I5022 Chronic systolic (congestive) heart failure: Secondary | ICD-10-CM | POA: Diagnosis not present

## 2018-02-19 DIAGNOSIS — E782 Mixed hyperlipidemia: Secondary | ICD-10-CM | POA: Diagnosis not present

## 2018-02-19 DIAGNOSIS — I10 Essential (primary) hypertension: Secondary | ICD-10-CM | POA: Diagnosis not present

## 2018-02-19 DIAGNOSIS — I2693 Single subsegmental pulmonary embolism without acute cor pulmonale: Secondary | ICD-10-CM | POA: Diagnosis not present

## 2018-02-19 DIAGNOSIS — I2510 Atherosclerotic heart disease of native coronary artery without angina pectoris: Secondary | ICD-10-CM | POA: Diagnosis not present

## 2018-03-04 NOTE — Progress Notes (Deleted)
The patient is a 72 year old male with a history of systolic congestive heart failure.  He was admitted to the hospital on 02/08/2018 for 1 day with chest pain, he underwent CT scan which showed acute pulmonary embolism.  **CT chest 02/08/2018>> imaging personally reviewed, there is a right lower lobe patchy atelectasis versus infiltrate, with associated pleural thickening.  These areas correspond to an area of the filling defect in the right lower lobe pulmonary artery consistent with pulmonary embolism with infarction.

## 2018-03-05 ENCOUNTER — Institutional Professional Consult (permissible substitution): Payer: PPO | Admitting: Internal Medicine

## 2018-03-15 ENCOUNTER — Ambulatory Visit: Payer: PPO | Admitting: Internal Medicine

## 2018-03-15 ENCOUNTER — Encounter: Payer: Self-pay | Admitting: Internal Medicine

## 2018-03-15 VITALS — BP 120/62 | HR 75 | Ht 73.0 in | Wt 197.8 lb

## 2018-03-15 DIAGNOSIS — I2782 Chronic pulmonary embolism: Secondary | ICD-10-CM | POA: Diagnosis not present

## 2018-03-15 DIAGNOSIS — R7611 Nonspecific reaction to tuberculin skin test without active tuberculosis: Secondary | ICD-10-CM

## 2018-03-15 NOTE — Progress Notes (Signed)
[place] [facility] Consultation      Assessment and Plan:  Pulmonary embolism with infarction. - Status post pulmonary embolism with likely associated infarction area as seen on CT chest 02/08/2018. - Currently doing well on Eliquis with no bleeding complications, however patient reports that he does not like being on the medication as it makes him fairly fatigued, would like to come off of it as soon as it is feasible to do so..  Patient does not appear to have any other known risk factors for pulmonary embolism.  -Discussed that we would continue to complete a 28-month course, at that time we can follow-up and consider further  testing for risk stratification.  We also had a discussion about avoiding high risk activities where he could fall and be hurt such as climbing ladders.  Hemoptysis. -Discussed his episode of hemoptysis, and explained that this is likely related to pulmonary function, now appears resolved.  Positive PPD. -Patient tells me he has had several PPDs positive in the past, though he does not recall being treated for latent tuberculosis. - We will check a TB QuantiFERON.  History of asbestos exposure. 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  Currently I see no evidence of pulmonary asbestosis.  Systolic congestive heart failure, cardiomyopathy. - Known ejection fraction of 20 to 25%, currently followed with cardiology. - He is being considered for a AICD.  Orders Placed This Encounter  Procedures  . QuantiFERON-TB [place]   Return in about 5 months (around 08/14/2018).    Date: 03/15/2018  MRN# [id number] Darius Parker July 10, 1945  Referring Physician: Dr. Caryn Section for hemoptysis.   Darius Parker is a 72 y.o. old male seen in consultation for chief complaint of:    Chief Complaint  Patient presents with  . Consult    Referred by Dr. Caryn Section, pneumonia  . Consult    clot on lung, cleared with ampicillin   . Cough    has a few bouts a day, clear   . Shortness of Breath    has always had shortness of breath    HPI:  The patient is a 72 yo male recently admitted on 02/08/18 for one day. He presented with complaint of coughing up blood and dyspnea for about 3 days. He was discharged on eliquis and abx for about 7 days, and by that time the hemoptysis had resolved.  He feels that eliquis makes him feel weaker, he is off baby aspirin. He has had no bleeding issues.  He has never had a blood clot in the past.  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 He has never been a smoker.  He tells me that he has positive PPD tests in the past, but does not recall ever being treated for it.   He does not feel limited in general by breathing but feels that his endurance in general has been less than people his age. He does have cardiomyopathy with EF of 20%. He sees Cardiology and they discussed possible AICD placement.   **CT chest 02/08/2018>> images personally reviewed, there is a segmental right lower lobe pulmonary filling defect consistent with pulmonary embolism, there are also infiltrates distantly in the right lower lobe consistent with pulmonary infarction. CXR 02/15/18>>Mild hyperinflation.  **Echo 02/09/18 EF 20%.    PMHX:  Past Medical History:  Diagnosis Date  . Arthritis   . Bursitis   . Dysrhythmia   . Heart murmur   . Hepatitis   . Hypertension    Surgical Hx:  Past Surgical History:  Procedure Laterality Date  . BACK SURGERY    . CARDIAC CATHETERIZATION    . CATARACT EXTRACTION W/PHACO Right 03/01/2016   Procedure: CATARACT EXTRACTION PHACO AND INTRAOCULAR LENS PLACEMENT ([place]);  Surgeon: Estill Cotta, MD;  Location: [facility] ORS;  Service: Ophthalmology;  Laterality: Right;  [place] 1.33 AP% 21.6 CDE 37.45 Fluid Pack Lot # [id number]  . COLONOSCOPY W/ POLYPECTOMY     . TONSILLECTOMY     Family Hx:  No family history on file. Social Hx:   Social History   Tobacco Use  . Smoking status: Never Smoker  . Smokeless tobacco: Never Used  Substance Use Topics  . Alcohol use: No  . Drug use: Never   Medication:    Current Outpatient Medications:  .  apixaban (ELIQUIS) 5 MG TABS tablet, Take (2 tabs)10 mg twice a day for 7 days and then 5 (1 tab) mg twice a day, Disp: 60 tablet, Rfl: 2 .  atorvastatin (LIPITOR) 10 MG tablet, Take 10 mg at bedtime by mouth., Disp: , Rfl: 0 .  carvedilol (COREG) 12.5 MG tablet, Take 12.5 mg by mouth 2 (two) times daily with a meal., Disp: , Rfl:  .  enalapril (VASOTEC) 10 MG tablet, Take 10 mg by mouth 2 (two) times daily., Disp: , Rfl:  .  furosemide (LASIX) 20 MG tablet, Take 20 mg by mouth daily., Disp: , Rfl:  .  omega-3 acid ethyl esters (LOVAZA) 1 g capsule, Take 1 capsule (1 g total) by mouth 2 (two) times daily., Disp: 60 capsule, Rfl: 5   Allergies:  Tetanus toxoids  Review of Systems: Gen:  Denies  fever, sweats, chills HEENT: Denies blurred vision, double vision. bleeds, sore throat Cvc:  No dizziness, chest pain. Resp:   Denies cough or sputum production, shortness of breath Gi: Denies swallowing difficulty, stomach pain. Gu:  Denies bladder incontinence, burning urine Ext:   No Joint pain, stiffness. Skin: No skin rash,  hives  Endoc:  No polyuria, polydipsia. Psych: No depression, insomnia. Other:  All other systems were reviewed with the patient and were negative other that what is mentioned in the HPI.   Physical Examination:   VS: BP 120/62 (BP Location: Left Arm, Cuff Size: Normal)   Pulse 75   Ht 6\' 1"  (1.854 m)   Wt 197 lb 12.8 oz (89.7 kg)   SpO2 98%   BMI 26.10 kg/m   General Appearance: No distress  Neuro:without focal findings,  speech normal,  HEENT: PERRLA, EOM intact.   Pulmonary: normal breath sounds, No wheezing.  CardiovascularNormal S1,S2.  No m/r/g.   Abdomen: Benign,  Soft, non-tender. Renal:  No costovertebral tenderness  GU:  No performed at this time. Endoc: No evident thyromegaly, no signs of acromegaly. Skin:   warm, no rashes, no ecchymosis  Extremities: normal, no cyanosis, clubbing.  Other findings:    LABORATORY PANEL:   CBC No results for input(s): WBC, HGB, HCT, PLT in the last 168 hours. ------------------------------------------------------------------------------------------------------------------  Chemistries  No results for input(s): NA, K, CL, CO2, GLUCOSE, BUN, CREATININE, CALCIUM, MG, AST, ALT, ALKPHOS, BILITOT in the last 168 hours.  Invalid input(s): GFRCGP ------------------------------------------------------------------------------------------------------------------  Cardiac Enzymes No results for input(s): TROPONINI in the last 168 hours. ------------------------------------------------------------  RADIOLOGY:  No results found.  Thank  you for the consultation and for allowing [facility] to assist in the care of your patient. Our recommendations are noted above.  Please contact us if we can be of further service.   Marda Stalker, M.D., F.C.C.P.  Board Certified in Internal Medicine, Pulmonary Medicine, [place], and Sleep Medicine.   Pulmonary and Critical Care Office Number: [phone]   03/15/2018

## 2018-03-15 NOTE — Patient Instructions (Addendum)
Will continue eliquis for a total of 6 months of therapy. After you stop the eliquis we can re-evaluate you to see if we need to restart it.  Will send you for a TB blood test.  Remember to avoid climbing ladders or other activites where you can fall as this can cause life threatening bleeding.

## 2018-03-18 ENCOUNTER — Other Ambulatory Visit
Admission: RE | Admit: 2018-03-18 | Discharge: 2018-03-18 | Disposition: A | Discharge status: Home / Self Care | Payer: PPO | Source: Ambulatory Visit | Attending: Internal Medicine | Admitting: Internal Medicine

## 2018-03-18 DIAGNOSIS — I2782 Chronic pulmonary embolism: Secondary | ICD-10-CM | POA: Insufficient documentation

## 2018-03-18 DIAGNOSIS — R7611 Nonspecific reaction to tuberculin skin test without active tuberculosis: Secondary | ICD-10-CM | POA: Diagnosis not present

## 2018-03-20 LAB — QUANTIFERON-TB GOLD PLUS: QUANTIFERON-TB GOLD PLUS: NEGATIVE

## 2018-03-20 LAB — QUANTIFERON-TB GOLD PLUS (RQFGPL)
QUANTIFERON TB1 AG VALUE: 0.03 [IU]/mL
QuantiFERON Mitogen Value: 9.55 IU/mL
QuantiFERON Nil Value: 0.02 IU/mL
QuantiFERON TB2 Ag Value: 0.02 IU/mL

## 2018-04-12 ENCOUNTER — Other Ambulatory Visit: Payer: Self-pay | Admitting: Family Medicine

## 2018-04-12 DIAGNOSIS — E785 Hyperlipidemia, unspecified: Secondary | ICD-10-CM

## 2018-04-12 MED ORDER — OMEGA-3-ACID ETHYL ESTERS 1 G PO CAPS: 1.0000 g | ORAL_CAPSULE | Freq: Two times a day (BID) | ORAL | 12 refills | Status: DC

## 2018-04-12 NOTE — Telephone Encounter (Signed)
omega-3 acid ethyl esters (LOVAZA) 1 g capsule Pt requesting Omega 3 be filled for him. 2 times a day with meals.  He is down to 4 pills - 2 days left.   Please fill at: [id number] [address], [place] (Phone) [phone] (Fax)     Thanks, [facility]

## 2018-05-06 ENCOUNTER — Other Ambulatory Visit: Payer: Self-pay | Admitting: Family Medicine

## 2018-05-06 ENCOUNTER — Telehealth: Payer: Self-pay | Admitting: Family Medicine

## 2018-05-06 MED ORDER — APIXABAN 5 MG PO TABS: ORAL_TABLET | ORAL | 2 refills | Status: DC

## 2018-05-06 NOTE — Telephone Encounter (Signed)
Patient needs refill on Eliquis 5 mg. [facility] [address]

## 2018-05-06 NOTE — Telephone Encounter (Signed)
Refilled to complete 6 month course as per pulmonology.

## 2018-05-06 NOTE — Telephone Encounter (Signed)
Please review, medication was last prescribed 02/09/18  By Dr. Posey Pronto . LOV to address medication was with pulmonologist Dr. Ashby Dawes. KW

## 2018-07-15 DIAGNOSIS — I2782 Chronic pulmonary embolism: Secondary | ICD-10-CM | POA: Diagnosis not present

## 2018-07-15 DIAGNOSIS — E782 Mixed hyperlipidemia: Secondary | ICD-10-CM | POA: Diagnosis not present

## 2018-07-15 DIAGNOSIS — I2510 Atherosclerotic heart disease of native coronary artery without angina pectoris: Secondary | ICD-10-CM | POA: Diagnosis not present

## 2018-07-15 DIAGNOSIS — I5022 Chronic systolic (congestive) heart failure: Secondary | ICD-10-CM | POA: Diagnosis not present

## 2018-07-15 DIAGNOSIS — I10 Essential (primary) hypertension: Secondary | ICD-10-CM | POA: Diagnosis not present

## 2018-07-22 DIAGNOSIS — E7800 Pure hypercholesterolemia, unspecified: Secondary | ICD-10-CM | POA: Diagnosis not present

## 2018-11-12 ENCOUNTER — Telehealth: Payer: Self-pay | Admitting: Family Medicine

## 2018-11-12 NOTE — Chronic Care Management (AMB) (Signed)
°  Chronic Care Management   Outreach Note  11/12/2018 Name: Darius Parker MRN: [id number] DOB: 1946/03/23  Referred by: Birdie Sons, MD Reason for referral : Chronic Care Management (Initial CCM outreach was unsuccessful. )   An unsuccessful telephone outreach was attempted today. The patient was referred to the case management team by for assistance with chronic care management and care coordination.   Follow Up Plan: A HIPPA compliant phone message was left for the patient providing contact information and requesting a return call.  The care management team will reach out to the patient again over the next 7 days.  If patient returns call to provider office, please advise to call [facility] [place]  [email or web address]   ??[phone]

## 2018-11-12 NOTE — Telephone Encounter (Signed)
Pt returning call

## 2018-11-12 NOTE — Chronic Care Management (AMB) (Signed)
Chronic Care Management   Note  11/12/2018 Name: GRABIEL SCHMUTZ MRN: [id number] DOB: May 30, 1945  Darius Parker is a 73 y.o. year old male who is a primary care patient of Caryn Section, Kirstie Peri, MD. I reached out to Darius Parker by phone today in response to a referral sent by Mr. OLSEN MCCUTCHAN health plan.    Mr. Tierce was given information about Chronic Care Management services today including:  1. CCM service includes personalized support from designated clinical staff supervised by his physician, including individualized plan of care and coordination with other care providers 2. 24/7 contact phone numbers for assistance for urgent and routine care needs. 3. Service will only be billed when office clinical staff spend 20 minutes or more in a month to coordinate care. 4. Only one practitioner may furnish and bill the service in a calendar month. 5. The patient may stop CCM services at any time (effective at the end of the month) by phone call to the office staff. 6. The patient will be responsible for cost sharing (co-pay) of up to 20% of the service fee (after annual deductible is met).  Patient did not agree to enrollment in care management services and does not wish to consider at this time.  Follow up plan: The patient has been provided with contact information for the chronic care management team and has been advised to call with any health related questions or concerns.   [place]  [email or web address]   ??[phone]

## 2019-01-02 DIAGNOSIS — I2510 Atherosclerotic heart disease of native coronary artery without angina pectoris: Secondary | ICD-10-CM | POA: Diagnosis not present

## 2019-01-02 DIAGNOSIS — I10 Essential (primary) hypertension: Secondary | ICD-10-CM | POA: Diagnosis not present

## 2019-01-02 DIAGNOSIS — I5022 Chronic systolic (congestive) heart failure: Secondary | ICD-10-CM | POA: Diagnosis not present

## 2019-01-02 DIAGNOSIS — E782 Mixed hyperlipidemia: Secondary | ICD-10-CM | POA: Diagnosis not present

## 2019-04-28 ENCOUNTER — Telehealth: Payer: Self-pay

## 2019-04-28 NOTE — Telephone Encounter (Signed)
Patient advised.

## 2019-04-28 NOTE — Telephone Encounter (Signed)
He can take OTC Tylenol up to 2 extra strength tablets three times a day

## 2019-04-28 NOTE — Telephone Encounter (Signed)
Copied from [place] [phone]. Topic: General - Other >> Apr 28, 2019 11:25 AM Burchel, Abbi R wrote: Reason for CRM: Pt would like a call back from Dr Maralyn Sago nurse re: pain medication he can take until tomorrow's appt for joint pain (hip).  Please advise: [phone]

## 2019-04-29 ENCOUNTER — Encounter: Payer: Self-pay | Admitting: Family Medicine

## 2019-04-29 ENCOUNTER — Other Ambulatory Visit: Payer: Self-pay

## 2019-04-29 ENCOUNTER — Ambulatory Visit (INDEPENDENT_AMBULATORY_CARE_PROVIDER_SITE_OTHER): Payer: PPO | Admitting: Family Medicine

## 2019-04-29 VITALS — BP 106/60 | HR 81 | Temp 96.9°F | Wt 191.0 lb

## 2019-04-29 DIAGNOSIS — R1310 Dysphagia, unspecified: Secondary | ICD-10-CM | POA: Diagnosis not present

## 2019-04-29 DIAGNOSIS — S39012A Strain of muscle, fascia and tendon of lower back, initial encounter: Secondary | ICD-10-CM

## 2019-04-29 DIAGNOSIS — G5603 Carpal tunnel syndrome, bilateral upper limbs: Secondary | ICD-10-CM

## 2019-04-29 DIAGNOSIS — R1319 Other dysphagia: Secondary | ICD-10-CM

## 2019-04-29 MED ORDER — METHOCARBAMOL 500 MG PO TABS: 500.0000 mg | ORAL_TABLET | Freq: Four times a day (QID) | ORAL | 0 refills | Status: DC

## 2019-04-29 MED ORDER — FAMOTIDINE 20 MG PO TABS: 20.0000 mg | ORAL_TABLET | Freq: Two times a day (BID) | ORAL | 0 refills | Status: DC

## 2019-04-29 NOTE — Progress Notes (Signed)
Darius Parker  MRN: [id number] DOB: 1946-02-03  Subjective:  HPI   The patient is a 73 year old male who presents with complaint of left hip pain for 2 days. Patient states that he was operating a stump grinder 2 days prior to the pain starting.  He has history of back surgery when he was in his 51's.  Walking is worse for the pain than sitting or standing.  He took Tylenol last night and did get some relief.   He also asked about his pulse.  He states that last night after coming in from the cold he was sitting and his watch gave him a reading in the 40's.  He admits that his hands were cold, fingertips were white and numb.   Patient also wants to discuss why whenever he eats his sinuses drain.  He states that it is clear but a little bit thick.  He also states he has thick mucus that he coughs up which is also clear.  He states that is thick and sometimes gets in the way of his eating.   Patient Active Problem List   Diagnosis Date Noted  . Pulmonary embolism ([place]) 02/08/2018  . CAP (community acquired pneumonia) 02/08/2018  . Essential hypertension 03/27/2017  . Chronic systolic heart failure ([place]) 03/27/2017  . Hyperlipidemia, mixed 03/27/2017  . Coronary artery disease involving native coronary artery of native heart without angina pectoris 03/27/2017    Past Medical History:  Diagnosis Date  . Arthritis   . Bursitis   . Dysrhythmia   . Heart murmur   . Hepatitis   . Hypertension    Past Surgical History:  Procedure Laterality Date  . BACK SURGERY    . CARDIAC CATHETERIZATION    . CATARACT EXTRACTION W/PHACO Right 03/01/2016   Procedure: CATARACT EXTRACTION PHACO AND INTRAOCULAR LENS PLACEMENT ([place]);  Surgeon: Estill Cotta, MD;  Location: [facility] ORS;  Service: Ophthalmology;  Laterality: Right;  [place] 1.33 AP% 21.6 CDE 37.45 Fluid Pack Lot # [id number]  . COLONOSCOPY W/ POLYPECTOMY    . TONSILLECTOMY     No family history on file.  Social History   Socioeconomic  History  . Marital status: Divorced    Spouse name: Not on file  . Number of children: Not on file  . Years of education: Not on file  . Highest education level: Not on file  Occupational History  . Not on file  Social Needs  . Financial resource strain: Not hard at all  . Food insecurity    Worry: Never true    Inability: Never true  . Transportation needs    Medical: No    Non-medical: No  Tobacco Use  . Smoking status: Never Smoker  . Smokeless tobacco: Never Used  Substance and Sexual Activity  . Alcohol use: No  . Drug use: Never  . Sexual activity: Not on file  Lifestyle  . Physical activity    Days per week: 0 days    Minutes per session: 0 min  . Stress: To some extent  Relationships  . Social connections    Talks on phone: More than three times a week    Gets together: More than three times a week    Attends religious service: Never    Active member of club or organization: No    Attends meetings of clubs or organizations: Never    Relationship status: Patient refused  . Intimate partner violence    Fear of current or  ex partner: No    Emotionally abused: No    Physically abused: No    Forced sexual activity: No  Other Topics Concern  . Not on file  Social History Narrative  . Not on file    Outpatient Encounter Medications as of 04/29/2019  Medication Sig  . apixaban (ELIQUIS) 5 MG TABS tablet One tablet twice daily  . atorvastatin (LIPITOR) 10 MG tablet Take 10 mg at bedtime by mouth.  . carvedilol (COREG) 12.5 MG tablet Take 12.5 mg by mouth 2 (two) times daily with a meal.  . enalapril (VASOTEC) 10 MG tablet Take 10 mg by mouth 2 (two) times daily.  . furosemide (LASIX) 20 MG tablet Take 20 mg by mouth daily.  Marland Kitchen omega-3 acid ethyl esters (LOVAZA) 1 g capsule Take 1 capsule (1 g total) by mouth 2 (two) times daily.   No facility-administered encounter medications on file as of 04/29/2019.    Allergies  Allergen Reactions  . Tetanus Toxoids  Swelling   Review of Systems  Constitutional: Negative for chills, diaphoresis, fever and malaise/fatigue.  HENT: Positive for congestion. Negative for ear pain, sinus pain and sore throat.   Respiratory: Positive for cough and sputum production. Negative for shortness of breath and wheezing.   Cardiovascular: Negative for chest pain, palpitations and leg swelling.  Gastrointestinal: Negative for abdominal pain and diarrhea.  Musculoskeletal: Positive for joint pain. Negative for back pain, myalgias and neck pain.  Neurological: Negative for headaches.    Objective:  BP 106/60 (BP Location: Right Arm, Patient Position: Sitting, Cuff Size: Normal)   Pulse 81   Temp (!) 96.9 F (36.1 C) (Skin)   Wt 191 lb (86.6 kg)   SpO2 98%   BMI 25.20 kg/m   Physical Exam  Constitutional: He is oriented to person, place, and time and well-developed, well-nourished, and in no distress.  HENT:  Head: Normocephalic.  Eyes: Conjunctivae are normal.  Neck: Neck supple.  Cardiovascular: Normal rate.  Pulmonary/Chest: Effort normal and breath sounds normal.  Abdominal: Soft. Bowel sounds are normal.  Musculoskeletal: Normal range of motion.        General: Tenderness present.     Comments: Tender muscles in the left lower back at the iliac crest to palpate. Good ROM with symmetric DTR's and SLR's to 90 degrees (mild discomfort on the left). Tingling in fingers with Phalen test bilaterally. Negative Tinel sign.  Neurological: He is alert and oriented to person, place, and time.  Skin: No rash noted.  Psychiatric: Mood, affect and judgment normal.    Assessment and Plan :   1. Strain of lumbar region, initial encounter Tender muscles at the top of the left iliac crest the past couple days after using a stump grinder. Pain with certain movements or to palpate that area. Suspect muscle strain and will give Robaxin for spasms. May use Tylenol prn pain (presently on Eliquis and has to restrict use of any  NSAID's). Apply moist heat or IcyHot with Lidocaine and recheck if no better in the next week if no better. - methocarbamol (ROBAXIN) 500 MG tablet; Take 1 tablet (500 mg total) by mouth 4 (four) times daily.  Dispense: 20 tablet; Refill: 0  2. Bilateral carpal tunnel syndrome Has had short episodes of numbness in fingers/hands intermittently that would wake him from sleep. Keep hands warm and monitor for cyanosis or blanching of skin as if Raynaud's Phenomena. Keep hands warm and use Aspercreme for aching discomfort. May need referral to orthopedist  for possible NCS or cortisone injections.  3. Esophageal dysphagia Occasionally having problems with swallowing large bolus of foods. No significant dyspepsia. No hematemesis or melena. Suspect early esophageal stricture and may need endoscopic evaluation if no better using Famotidine 20 mg BID. Eat smaller bites and chew up well. Drink fluids during meals and not only at the end of a meal. - famotidine (PEPCID) 20 MG tablet; Take 1 tablet (20 mg total) by mouth 2 (two) times daily.  Dispense: 60 tablet; Refill: 0

## 2019-05-21 ENCOUNTER — Other Ambulatory Visit: Payer: Self-pay | Admitting: Family Medicine

## 2019-05-21 DIAGNOSIS — R1319 Other dysphagia: Secondary | ICD-10-CM

## 2019-05-21 DIAGNOSIS — R1310 Dysphagia, unspecified: Secondary | ICD-10-CM

## 2019-05-21 NOTE — Telephone Encounter (Signed)
Approved per protocol. Requested Prescriptions  Pending Prescriptions Disp Refills  . famotidine (PEPCID) 20 MG tablet [Pharmacy Med Name: FAMOTIDINE 20 MG TABLET] 60 tablet 0    Sig: TAKE 1 TABLET BY MOUTH TWICE A DAY     Gastroenterology:  H2 Antagonists Passed - 05/21/2019  1:32 PM      Passed - Valid encounter within last 12 months    Recent Outpatient Visits          3 weeks ago Strain of lumbar region, initial encounter   [facility], Vickki Muff, [place]   1 year ago Hemoptysis   [facility] Birdie Sons, [place]   1 year ago Heat exhaustion, initial encounter   [place], [place]   2 years ago Acute pain of left knee   [facility], [place], [place]

## 2019-06-13 ENCOUNTER — Other Ambulatory Visit: Payer: Self-pay | Admitting: Family Medicine

## 2019-06-13 DIAGNOSIS — R1319 Other dysphagia: Secondary | ICD-10-CM

## 2019-06-13 DIAGNOSIS — R1310 Dysphagia, unspecified: Secondary | ICD-10-CM

## 2019-07-02 DIAGNOSIS — R002 Palpitations: Secondary | ICD-10-CM | POA: Diagnosis not present

## 2019-07-02 DIAGNOSIS — I5022 Chronic systolic (congestive) heart failure: Secondary | ICD-10-CM | POA: Diagnosis not present

## 2019-07-02 DIAGNOSIS — I25118 Atherosclerotic heart disease of native coronary artery with other forms of angina pectoris: Secondary | ICD-10-CM | POA: Diagnosis not present

## 2019-07-02 DIAGNOSIS — I10 Essential (primary) hypertension: Secondary | ICD-10-CM | POA: Diagnosis not present

## 2019-07-06 ENCOUNTER — Other Ambulatory Visit: Payer: Self-pay | Admitting: Family Medicine

## 2019-07-06 DIAGNOSIS — R1319 Other dysphagia: Secondary | ICD-10-CM

## 2019-07-06 DIAGNOSIS — Z20828 Contact with and (suspected) exposure to other viral communicable diseases: Secondary | ICD-10-CM | POA: Diagnosis not present

## 2019-07-06 DIAGNOSIS — R1310 Dysphagia, unspecified: Secondary | ICD-10-CM

## 2019-07-21 DIAGNOSIS — I5022 Chronic systolic (congestive) heart failure: Secondary | ICD-10-CM | POA: Diagnosis not present

## 2019-07-21 DIAGNOSIS — I25118 Atherosclerotic heart disease of native coronary artery with other forms of angina pectoris: Secondary | ICD-10-CM | POA: Diagnosis not present

## 2019-07-23 ENCOUNTER — Other Ambulatory Visit: Payer: Self-pay | Admitting: Family Medicine

## 2019-07-23 DIAGNOSIS — R1319 Other dysphagia: Secondary | ICD-10-CM

## 2019-07-23 DIAGNOSIS — R1310 Dysphagia, unspecified: Secondary | ICD-10-CM

## 2019-07-23 NOTE — Telephone Encounter (Signed)
Requested medication (s) are due for refill today: 06/14/19  Requested medication (s) are on the active medication list: yes  Last refill:  06/14/19  Future visit scheduled: no  Notes to clinic: this med was for a trial period    Requested Prescriptions  Pending Prescriptions Disp Refills   famotidine (PEPCID) 20 MG tablet [Pharmacy Med Name: FAMOTIDINE 20 MG TABLET] 60 tablet 0    Sig: TAKE 1 TABLET BY MOUTH TWICE A DAY      Gastroenterology:  H2 Antagonists Passed - 07/23/2019  9:26 AM      Passed - Valid encounter within last 12 months    Recent Outpatient Visits           2 months ago Strain of lumbar region, initial encounter   [facility], Vickki Muff, [place]   1 year ago Hemoptysis   [facility] Birdie Sons, MD   1 year ago Heat exhaustion, initial encounter   [place], [place]   2 years ago Acute pain of left knee   [place], [place], [place]

## 2019-07-24 ENCOUNTER — Ambulatory Visit: Payer: PPO | Attending: Internal Medicine

## 2019-07-24 DIAGNOSIS — Z23 Encounter for immunization: Secondary | ICD-10-CM | POA: Insufficient documentation

## 2019-07-24 NOTE — Progress Notes (Signed)
   Covid-19 Vaccination Clinic  Name:  Darius Parker    MRN: [id number] DOB: 1945-11-11  07/24/2019  [name] was observed post Covid-19 immunization for 15 minutes without incident. He was provided with Vaccine Information Sheet and instruction to access the V-Safe system.   [name] was instructed to call 911 with any severe reactions post vaccine: Marland Kitchen Difficulty breathing  . Swelling of face and throat  . A fast heartbeat  . A bad rash all over body  . Dizziness and weakness   Immunizations Administered    Name Date Dose VIS Date Route   Pfizer COVID-19 Vaccine 07/24/2019  8:23 AM 0.3 mL 05/02/2019 Intramuscular   Manufacturer: [place]   Lot: [id number]   [place]: [id number]

## 2019-07-31 DIAGNOSIS — I5022 Chronic systolic (congestive) heart failure: Secondary | ICD-10-CM | POA: Diagnosis not present

## 2019-07-31 DIAGNOSIS — I10 Essential (primary) hypertension: Secondary | ICD-10-CM | POA: Diagnosis not present

## 2019-07-31 DIAGNOSIS — E782 Mixed hyperlipidemia: Secondary | ICD-10-CM | POA: Diagnosis not present

## 2019-07-31 DIAGNOSIS — I25118 Atherosclerotic heart disease of native coronary artery with other forms of angina pectoris: Secondary | ICD-10-CM | POA: Diagnosis not present

## 2019-08-14 ENCOUNTER — Other Ambulatory Visit: Payer: Self-pay | Admitting: Family Medicine

## 2019-08-14 DIAGNOSIS — R1319 Other dysphagia: Secondary | ICD-10-CM

## 2019-08-14 DIAGNOSIS — R1310 Dysphagia, unspecified: Secondary | ICD-10-CM

## 2019-08-16 ENCOUNTER — Ambulatory Visit: Payer: PPO

## 2019-08-19 ENCOUNTER — Ambulatory Visit: Payer: PPO | Attending: Internal Medicine

## 2019-08-19 ENCOUNTER — Encounter: Payer: Self-pay | Admitting: Family Medicine

## 2019-08-19 ENCOUNTER — Other Ambulatory Visit: Payer: Self-pay

## 2019-08-19 ENCOUNTER — Ambulatory Visit (INDEPENDENT_AMBULATORY_CARE_PROVIDER_SITE_OTHER): Payer: PPO | Admitting: Family Medicine

## 2019-08-19 VITALS — BP 138/70 | HR 82 | Temp 96.8°F | Resp 16 | Wt 200.0 lb

## 2019-08-19 DIAGNOSIS — I2782 Chronic pulmonary embolism: Secondary | ICD-10-CM

## 2019-08-19 DIAGNOSIS — Z23 Encounter for immunization: Secondary | ICD-10-CM

## 2019-08-19 DIAGNOSIS — I5022 Chronic systolic (congestive) heart failure: Secondary | ICD-10-CM | POA: Diagnosis not present

## 2019-08-19 DIAGNOSIS — L819 Disorder of pigmentation, unspecified: Secondary | ICD-10-CM

## 2019-08-19 DIAGNOSIS — D225 Melanocytic nevi of trunk: Secondary | ICD-10-CM | POA: Diagnosis not present

## 2019-08-19 DIAGNOSIS — Z833 Family history of diabetes mellitus: Secondary | ICD-10-CM | POA: Diagnosis not present

## 2019-08-19 DIAGNOSIS — R739 Hyperglycemia, unspecified: Secondary | ICD-10-CM

## 2019-08-19 DIAGNOSIS — L918 Other hypertrophic disorders of the skin: Secondary | ICD-10-CM | POA: Diagnosis not present

## 2019-08-19 NOTE — Patient Instructions (Signed)
.   Please review the attached list of medications and notify my office if there are any errors.   . Please bring all of your medications to every appointment so we can make sure that our medication list is the same as yours.   

## 2019-08-19 NOTE — Progress Notes (Signed)
   Covid-19 Vaccination Clinic  Name:  Darius Parker    MRN: [id number] DOB: 09-19-1945  08/19/2019  Mr. Darius Parker was observed post Covid-19 immunization for 15 minutes without incident. He was provided with Vaccine Information Sheet and instruction to access the V-Safe system.   Mr. Darius Parker was instructed to call 911 with any severe reactions post vaccine: Marland Kitchen Difficulty breathing  . Swelling of face and throat  . A fast heartbeat  . A bad rash all over body  . Dizziness and weakness   Immunizations Administered    Name Date Dose VIS Date Route   Pfizer COVID-19 Vaccine 08/19/2019  1:53 PM 0.3 mL 05/02/2019 Intramuscular   Manufacturer: [place]   Lot: [id number]   [place]: [id number]

## 2019-08-19 NOTE — Progress Notes (Signed)
    Established patient visit      Patient: Darius Parker   DOB: [birth year]   74 y.o. Male  MRN: [id number] Visit Date: 08/19/2019  Today's healthcare provider: Lelon Huh, MD  Subjective:    Chief Complaint  Patient presents with  . Skin Tag   HPI Skin Tags: Patient complains of multiple itchy skin tags on his back. He states they have been there for years. Patient states one of the skin tags got caught in his shirt and started to bleed.   He also an irregularly colored lesion on his right upper chest for long period of time that he is concerned about.  He also reports his younger brother was recently diagnosed with type 2 diabetes. This patient does have history of mildly elevated blood sugars.     Medications: Outpatient Medications Prior to Visit  Medication Sig  . apixaban One tablet twice daily (Patient taking differently: Take 5 mg by mouth daily. One tablet twice daily)  . atorvastatin Take 10 mg at bedtime by mouth.  . carvedilol Take 12.5 mg by mouth 2 (two) times daily with a meal.  . enalapril Take 10 mg by mouth 2 (two) times daily.  . famotidine TAKE 1 TABLET BY MOUTH TWICE A DAY  . furosemide Take 20 mg by mouth daily.  . methocarbamol Take 1 tablet (500 mg total) by mouth 4 (four) times daily.  Marland Kitchen omega-3 acid ethyl esters Take 1 capsule (1 g total) by mouth 2 (two) times daily.   No facility-administered medications prior to visit.    Review of Systems  Constitutional: Negative for appetite change and chills.  Respiratory: Negative for chest tightness and wheezing.   Cardiovascular: Negative for chest pain and palpitations.  Gastrointestinal: Negative for abdominal pain and nausea.        Objective:    BP 138/70 (BP Location: Left Arm, Patient Position: Sitting, Cuff Size: Normal)   Pulse 82   Temp (!) 96.8 F (36 C) (Temporal)   Resp 16   Wt 200 lb (90.7 kg)   SpO2 97% Comment: room air  BMI 26.39 kg/m    Physical Exam  Three typical  skin takes across back and one fleshy lesion right upper chest with irregular coloration.     Assessment & Plan:    1. Skin tags, multiple acquired (3 on back)  2. Atypical pigmented skin lesion - Pathology ([facility])  All lesions prepped with isopropyl alcohol. Anesthetized with 1% lidocaine with epinephrine. Excised with straight Mayo scissors. Lesion from chest wall sent for pathological examination.     3. Hyperglycemia   4. Family history of diabetes mellitus  Counseled patient that he should be screened for diabetes. He states he has upcoming appt with his cardiologists Dr. Nehemiah Massed and expects to have labs done at that appt.   5. Chronic pulmonary embolism without acute cor pulmonale, unspecified pulmonary embolism type (HCC) Stable on Eliquis, managed by Dr. Nehemiah Massed  6. Chronic systolic heart failure (HCC) Stable on current medication regiment. Managed by Dr. Nehemiah Massed    The entirety of the information documented in the History of Present Illness, Review of Systems and Physical Exam were personally obtained by me. Portions of this information were initially documented by Idelle Jo, CMA and reviewed by me for thoroughness and accuracy.    Lelon Huh, MD  [facility] [phone] (phone) [phone] (fax)  [place]

## 2019-08-22 LAB — ANATOMIC PATHOLOGY REPORT: PDF Image: 0

## 2019-09-01 ENCOUNTER — Telehealth: Payer: Self-pay | Admitting: Family Medicine

## 2019-09-01 NOTE — Telephone Encounter (Signed)
Spoke with patient's daughter she will give him message I will call him on tomorrow.

## 2019-09-02 NOTE — Telephone Encounter (Signed)
No idea? 

## 2019-09-02 NOTE — Telephone Encounter (Signed)
Pt is concern and will wait for dr Caryn Section call

## 2019-09-03 NOTE — Telephone Encounter (Signed)
Patient states someone from the office called his sister in law. He did not know who it was. Patient advised nothing is documented in his chart to disregard call.

## 2019-09-19 ENCOUNTER — Ambulatory Visit (INDEPENDENT_AMBULATORY_CARE_PROVIDER_SITE_OTHER): Payer: PPO | Admitting: Physician Assistant

## 2019-09-19 ENCOUNTER — Ambulatory Visit
Admission: RE | Admit: 2019-09-19 | Discharge: 2019-09-19 | Disposition: A | Discharge status: Home / Self Care | Payer: PPO | Source: Ambulatory Visit | Attending: Physician Assistant | Admitting: Physician Assistant

## 2019-09-19 DIAGNOSIS — R05 Cough: Secondary | ICD-10-CM

## 2019-09-19 DIAGNOSIS — R059 Cough, unspecified: Secondary | ICD-10-CM

## 2019-09-19 DIAGNOSIS — J301 Allergic rhinitis due to pollen: Secondary | ICD-10-CM

## 2019-09-19 NOTE — Progress Notes (Signed)
Virtual telephone visit    Virtual Visit via Telephone Note   This visit type was conducted due to national recommendations for restrictions regarding the COVID-19 Pandemic (e.g. social distancing) in an effort to limit this patient's exposure and mitigate transmission in our community. Due to his co-morbid illnesses, this patient is at least at moderate risk for complications without adequate follow up. This format is felt to be most appropriate for this patient at this time. The patient did not have access to video technology or had technical difficulties with video requiring transitioning to audio format only (telephone). Physical exam was limited to content and character of the telephone converstion.    Patient location: Home Provider location: Office   Patient: Darius Parker   DOB: 06-17-1945   74 y.o. Male  MRN: [id number] Visit Date: 09/19/2019  Today's healthcare provider: Trinna Post, PA-C   Chief Complaint  Patient presents with  . Cough  I,Jerriyah Louis M Laranda Burkemper,acting as a scribe for Trinna Post, PA-C.,have documented all relevant documentation on the behalf of Trinna Post, PA-C,as directed by  Trinna Post, PA-C while in the presence of Trinna Post, PA-C.  Subjective    Cough This is a new problem. The current episode started in the past 7 days. The problem has been gradually worsening. The cough is productive of sputum. Associated symptoms include nasal congestion and postnasal drip. Pertinent negatives include no ear congestion, ear pain, headaches, rhinorrhea, sore throat, shortness of breath or wheezing. The symptoms are aggravated by lying down. He has tried OTC cough suppressant for the symptoms. The treatment provided mild relief.   Patient has a history of CHF. Reports having both COVID vaccinations, second on 08/25/2019.  He has taken some sleep ease. Denies swelling or weight gain.       Medications: Outpatient Medications Prior to Visit    Medication Sig  . apixaban (ELIQUIS) 5 MG TABS tablet One tablet twice daily (Patient taking differently: Take 5 mg by mouth daily. One tablet twice daily)  . atorvastatin (LIPITOR) 10 MG tablet Take 10 mg at bedtime by mouth.  . carvedilol (COREG) 12.5 MG tablet Take 12.5 mg by mouth 2 (two) times daily with a meal.  . enalapril (VASOTEC) 10 MG tablet Take 10 mg by mouth 2 (two) times daily.  . famotidine (PEPCID) 20 MG tablet TAKE 1 TABLET BY MOUTH TWICE A DAY  . furosemide (LASIX) 20 MG tablet Take 20 mg by mouth daily.  . methocarbamol (ROBAXIN) 500 MG tablet Take 1 tablet (500 mg total) by mouth 4 (four) times daily.  Marland Kitchen omega-3 acid ethyl esters (LOVAZA) 1 g capsule Take 1 capsule (1 g total) by mouth 2 (two) times daily.   No facility-administered medications prior to visit.    Review of Systems  HENT: Positive for postnasal drip. Negative for ear pain, rhinorrhea, sinus pressure, sinus pain, sneezing and sore throat.   Respiratory: Positive for cough. Negative for shortness of breath and wheezing.   Gastrointestinal: Negative for diarrhea and nausea.  Neurological: Negative for headaches.      Objective    There were no vitals taken for this visit.     Assessment & Plan    1. Allergic rhinitis due to pollen, unspecified seasonality  Suspect allergic rhinitis, recommend daily 2nd gen antihistamine and flonase  2. Cough  Due to comorbidity, will get CXR to r/o pneumonia. Can consider COVID testing, though he is 2+ weeks out from 2nd covid  vaccine.   - DG Chest 2 View; Future    Return if symptoms worsen or fail to improve.    I discussed the assessment and treatment plan with the patient. The patient was provided an opportunity to ask questions and all were answered. The patient agreed with the plan and demonstrated an understanding of the instructions.   The patient was advised to call back or seek an in-person evaluation if the symptoms worsen or if the  condition fails to improve as anticipated.  I provided 15 minutes of non-face-to-face time during this encounter.  ITrinna Post, PA-C, have reviewed all documentation for this visit. The documentation on 09/19/19 for the exam, diagnosis, procedures, and orders are all accurate and complete.   Paulene Floor [facility] [phone] (phone) [phone] (fax)  [place]

## 2019-09-19 NOTE — Patient Instructions (Addendum)
Take Claritin, Zyrtec, Allegra or xyzal once daily Take flonase 2 sprays daily   Cough, Adult A cough helps to clear your throat and lungs. A cough may be a sign of an illness or another medical condition. An acute cough may only last 2-3 weeks, while a chronic cough may last 8 or more weeks. Many things can cause a cough. They include:  Germs (viruses or bacteria) that attack the airway.  Breathing in things that bother (irritate) your lungs.  Allergies.  Asthma.  Mucus that runs down the back of your throat (postnasal drip).  Smoking.  Acid backing up from the stomach into the tube that moves food from the mouth to the stomach (gastroesophageal reflux).  Some medicines.  Lung problems.  Other medical conditions, such as heart failure or a blood clot in the lung (pulmonary embolism). Follow these instructions at home: Medicines  Take over-the-counter and prescription medicines only as told by your doctor.  Talk with your doctor before you take medicines that stop a cough (coughsuppressants). Lifestyle   Do not smoke, and try not to be around smoke. Do not use any products that contain nicotine or tobacco, such as cigarettes, e-cigarettes, and chewing tobacco. If you need help quitting, ask your doctor.  Drink enough fluid to keep your pee (urine) pale yellow.  Avoid caffeine.  Do not drink alcohol if your doctor tells you not to drink. General instructions   Watch for any changes in your cough. Tell your doctor about them.  Always cover your mouth when you cough.  Stay away from things that make you cough, such as perfume, candles, campfire smoke, or cleaning products.  If the air is dry, use a cool mist vaporizer or humidifier in your home.  If your cough is worse at night, try using extra pillows to raise your head up higher while you sleep.  Rest as needed.  Keep all follow-up visits as told by your doctor. This is important. Contact a doctor if:  You  have new symptoms.  You cough up pus.  Your cough does not get better after 2-3 weeks, or your cough gets worse.  Cough medicine does not help your cough and you are not sleeping well.  You have pain that gets worse or pain that is not helped with medicine.  You have a fever.  You are losing weight and you do not know why.  You have night sweats. Get help right away if:  You cough up blood.  You have trouble breathing.  Your heartbeat is very fast. These symptoms may be an emergency. Do not wait to see if the symptoms will go away. Get medical help right away. Call your local emergency services (911 in the [place] Do not drive yourself to the hospital. Summary  A cough helps to clear your throat and lungs. Many things can cause a cough.  Take over-the-counter and prescription medicines only as told by your doctor.  Always cover your mouth when you cough.  Contact a doctor if you have new symptoms or you have a cough that does not get better or gets worse. This information is not intended to replace advice given to you by your health care provider. Make sure you discuss any questions you have with your health care provider. Document Revised: 05/27/2018 Document Reviewed: 05/27/2018 Elsevier Patient Education  [place].

## 2019-09-22 ENCOUNTER — Telehealth: Payer: Self-pay

## 2019-09-22 DIAGNOSIS — J984 Other disorders of lung: Secondary | ICD-10-CM

## 2019-09-22 NOTE — Telephone Encounter (Signed)
Patient was advised and also advised that someone will contact him for appointment after referral is placed for the pulmonologist.

## 2019-09-22 NOTE — Telephone Encounter (Signed)
Patient x-ray was resulted and he was advised over results.

## 2019-09-22 NOTE — Telephone Encounter (Signed)
-----   Message from Trinna Post, [place] sent at 09/19/2019  4:54 PM EDT ----- No evidence pneumonia. Some chronic lung disease. Recommend pulmonologist referral if he doesn't already see one.

## 2019-09-22 NOTE — Telephone Encounter (Signed)
Copied from [place] [phone] [phone]. Topic: General - Inquiry >> Sep 22, 2019  1:18 PM Mathis Bud wrote: Reason for CRM: Patient is requesting nurse to call back to discuss xray results. Call back [phone]

## 2019-09-22 NOTE — Telephone Encounter (Signed)
Opened in error

## 2019-09-23 NOTE — Telephone Encounter (Signed)
Patient was advised that someone from the referral team or the pulmonologist office will contact him for referral location and time.

## 2019-09-23 NOTE — Telephone Encounter (Signed)
Pt called back, wants to be contacted once referral location is confirmed.

## 2019-10-01 ENCOUNTER — Telehealth: Payer: Self-pay | Admitting: Family Medicine

## 2019-10-01 NOTE — Telephone Encounter (Signed)
Spoke with patient [place] the beach and want to call back next week to scheduled AWVI

## 2019-10-06 ENCOUNTER — Telehealth: Payer: Self-pay

## 2019-10-06 DIAGNOSIS — E782 Mixed hyperlipidemia: Secondary | ICD-10-CM

## 2019-10-06 DIAGNOSIS — R739 Hyperglycemia, unspecified: Secondary | ICD-10-CM

## 2019-10-06 DIAGNOSIS — I10 Essential (primary) hypertension: Secondary | ICD-10-CM

## 2019-10-06 NOTE — Telephone Encounter (Signed)
OK per Dr. Caryn Section to order labs. Lab slip placed at the front desk. Patient advised to be fasting.

## 2019-10-06 NOTE — Telephone Encounter (Signed)
Copied from [place] [phone]. Topic: General - Inquiry >> Oct 06, 2019  1:39 PM Darius Parker, [place] wrote: Reason for CRM: Patient called in stating he was waiting for a call back about what time it would be okay for him to come in for his blood test. Please advise as no test or orders have been made.

## 2019-10-06 NOTE — Addendum Note (Signed)
Addended by: Randal Buba on: 10/06/2019 04:22 PM   Modules accepted: Orders

## 2019-10-07 DIAGNOSIS — R739 Hyperglycemia, unspecified: Secondary | ICD-10-CM | POA: Diagnosis not present

## 2019-10-07 DIAGNOSIS — E782 Mixed hyperlipidemia: Secondary | ICD-10-CM | POA: Diagnosis not present

## 2019-10-07 DIAGNOSIS — I10 Essential (primary) hypertension: Secondary | ICD-10-CM | POA: Diagnosis not present

## 2019-10-08 LAB — COMPREHENSIVE METABOLIC PANEL
ALT: 18 IU/L (ref 0–44)
AST: 22 IU/L (ref 0–40)
Albumin/Globulin Ratio: 1.4 (ref 1.2–2.2)
Albumin: 4 g/dL (ref 3.7–4.7)
Alkaline Phosphatase: 84 IU/L (ref 48–121)
BUN/Creatinine Ratio: 15 (ref 10–24)
BUN: 22 mg/dL (ref 8–27)
Bilirubin Total: 0.4 mg/dL (ref 0.0–1.2)
CO2: 21 mmol/L (ref 20–29)
Calcium: 8.7 mg/dL (ref 8.6–10.2)
Chloride: 107 mmol/L — ABNORMAL HIGH (ref 96–106)
Creatinine, Ser: 1.43 mg/dL — ABNORMAL HIGH (ref 0.76–1.27)
GFR calc Af Amer: 55 mL/min/{1.73_m2} — ABNORMAL LOW (ref >59)
GFR calc non Af Amer: 48 mL/min/{1.73_m2} — ABNORMAL LOW (ref >59)
Globulin, Total: 2.8 g/dL (ref 1.5–4.5)
Glucose: 89 mg/dL (ref 65–99)
Potassium: 4.2 mmol/L (ref 3.5–5.2)
Sodium: 143 mmol/L (ref 134–144)
Total Protein: 6.8 g/dL (ref 6.0–8.5)

## 2019-10-08 LAB — LIPID PANEL
Chol/HDL Ratio: 4.6 ratio (ref 0.0–5.0)
Cholesterol, Total: 124 mg/dL (ref 100–199)
HDL: 27 mg/dL — ABNORMAL LOW (ref >39)
LDL Chol Calc (NIH): 80 mg/dL (ref 0–99)
Triglycerides: 88 mg/dL (ref 0–149)
VLDL Cholesterol Cal: 17 mg/dL (ref 5–40)

## 2019-10-08 LAB — CBC
Hematocrit: 39.4 % (ref 37.5–51.0)
Hemoglobin: 13.4 g/dL (ref 13.0–17.7)
MCH: 30.7 pg (ref 26.6–33.0)
MCHC: 34 g/dL (ref 31.5–35.7)
MCV: 90 fL (ref 79–97)
Platelets: 176 10*3/uL (ref 150–450)
RBC: 4.37 x10E6/uL (ref 4.14–5.80)
RDW: 12.5 % (ref 11.6–15.4)
WBC: 7.3 10*3/uL (ref 3.4–10.8)

## 2019-10-08 LAB — HEMOGLOBIN A1C
Est. average glucose Bld gHb Est-mCnc: 111 mg/dL
Hgb A1c MFr Bld: 5.5 % (ref 4.8–5.6)

## 2019-10-22 IMAGING — CR DG KNEE COMPLETE 4+V*L*
1 series · 4 of 4 positions shown · non-contrast
Comparison: None.

CLINICAL DATA: Left knee pain

EXAM:
LEFT KNEE - COMPLETE 4+ VIEW

[Series 1: dg knee complete 4 views left · 0.14mm/px · 4 of 4 slices shown]
[im 1/4]
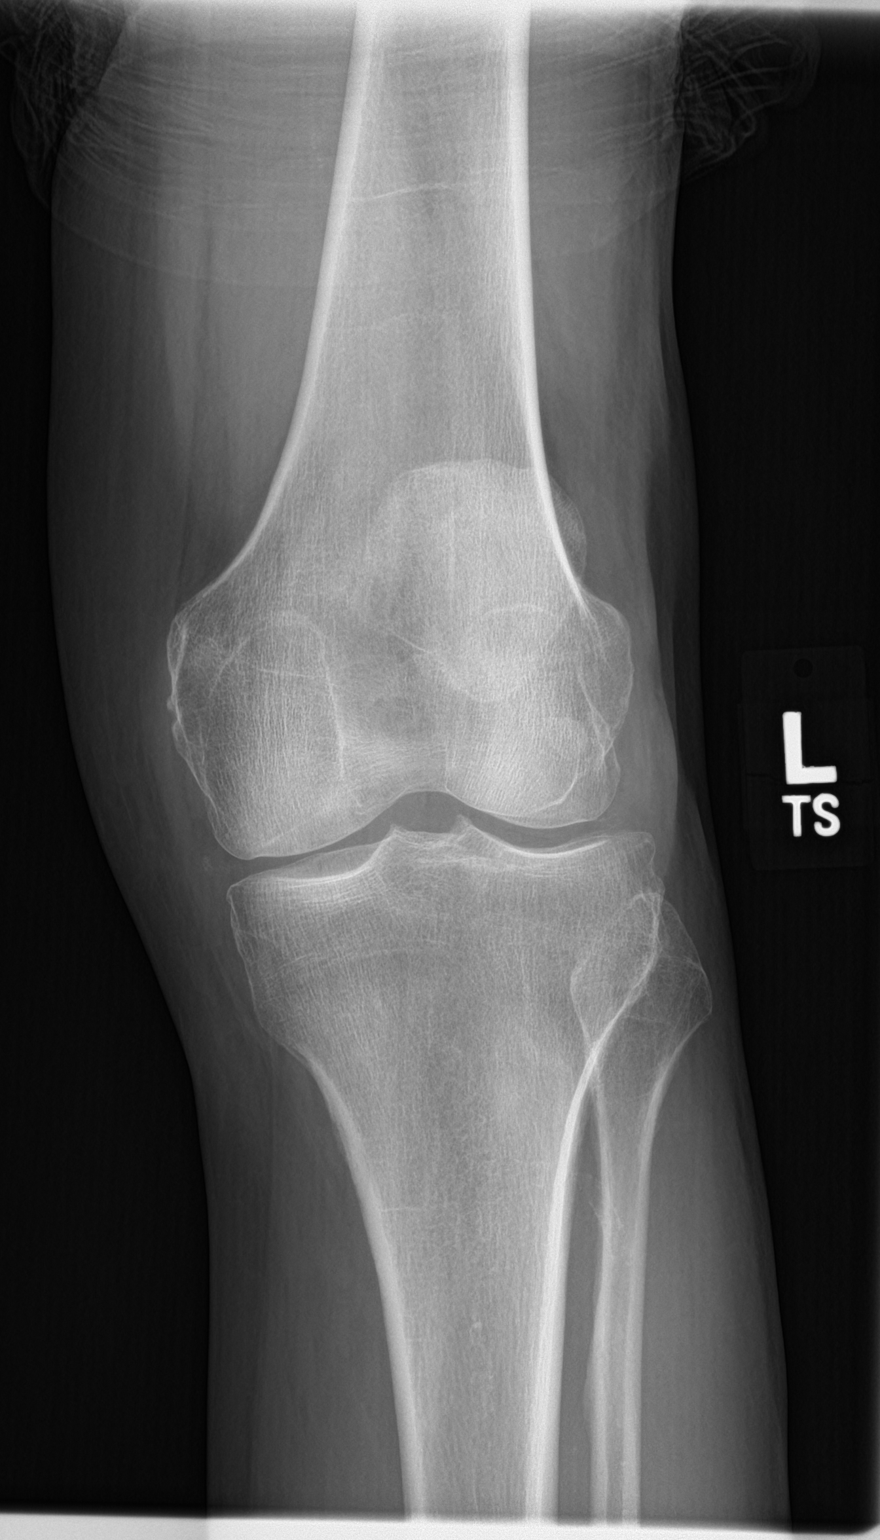
[im 2/4]
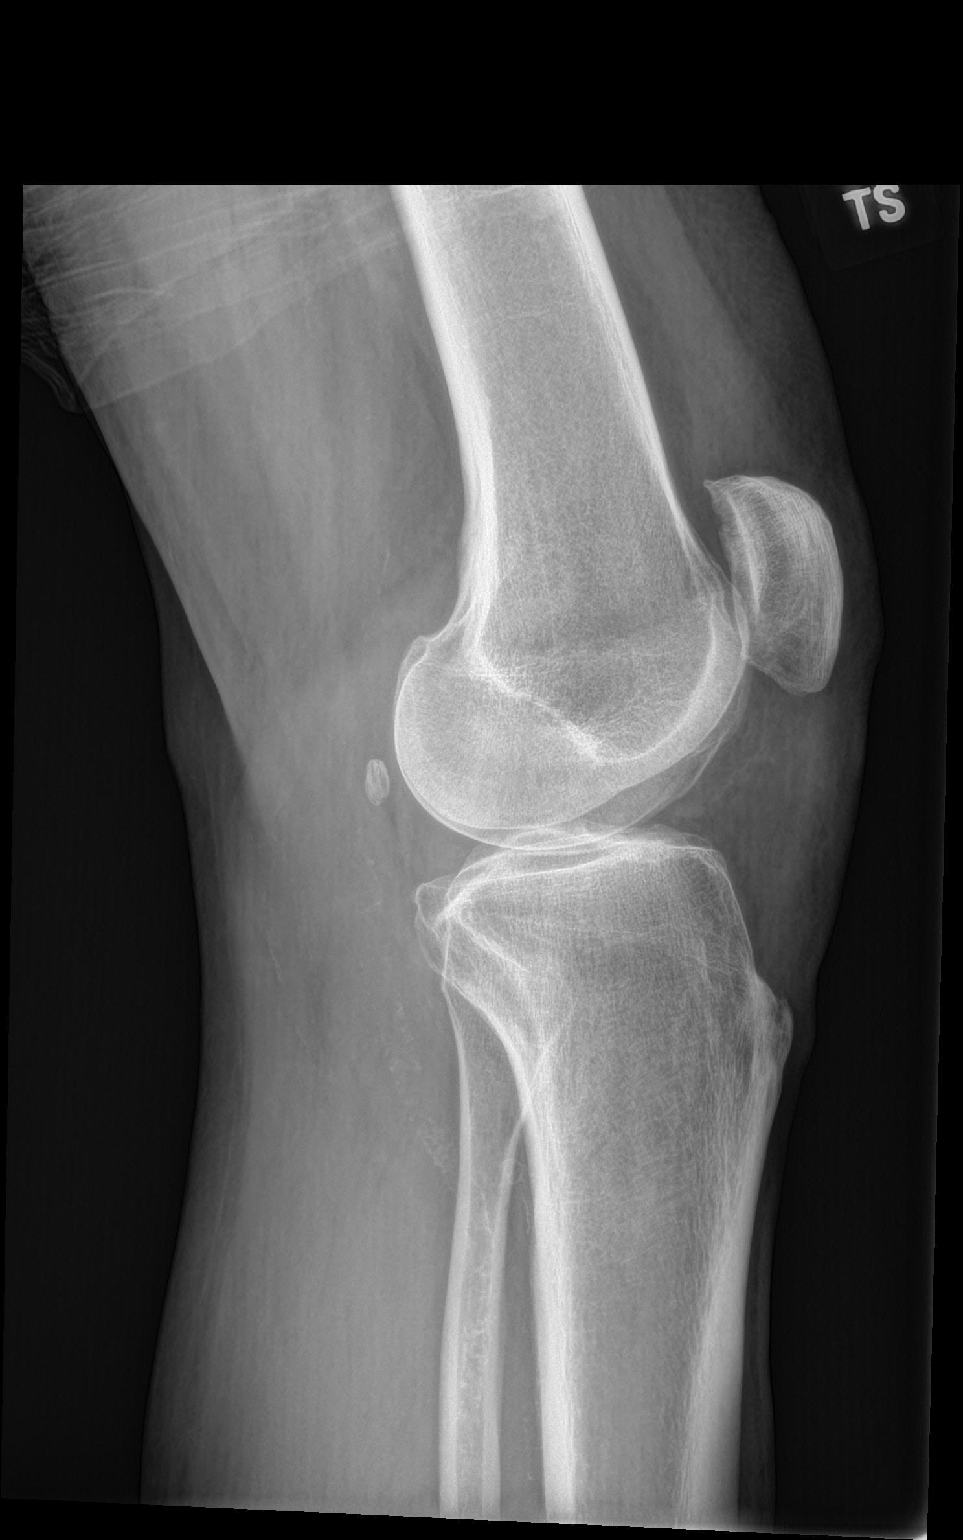
[im 3/4]
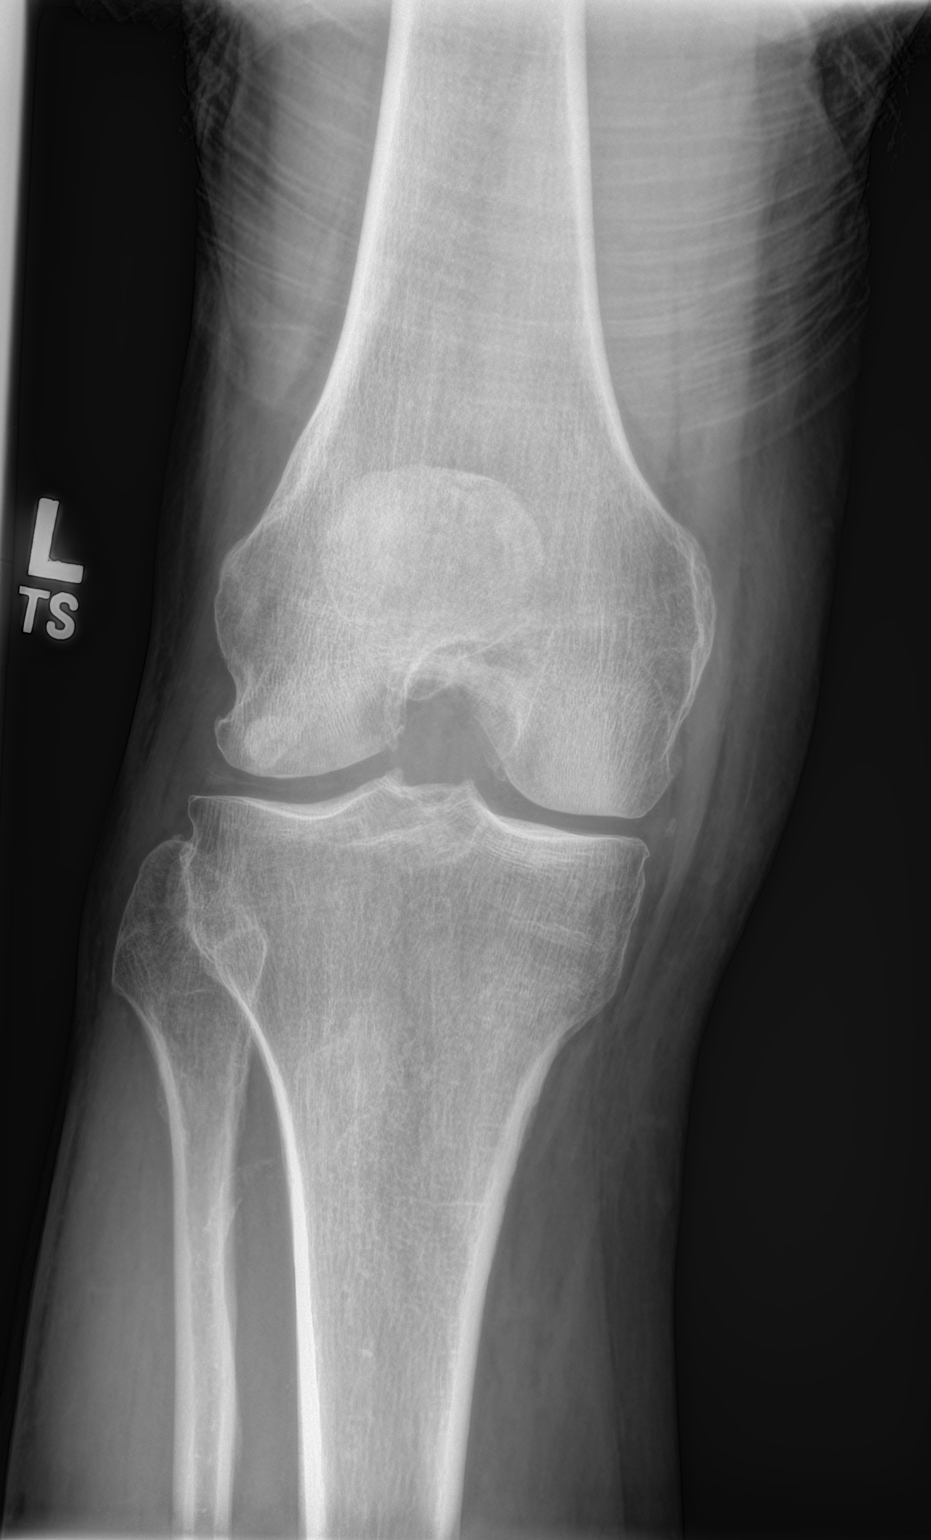
[im 4/4]
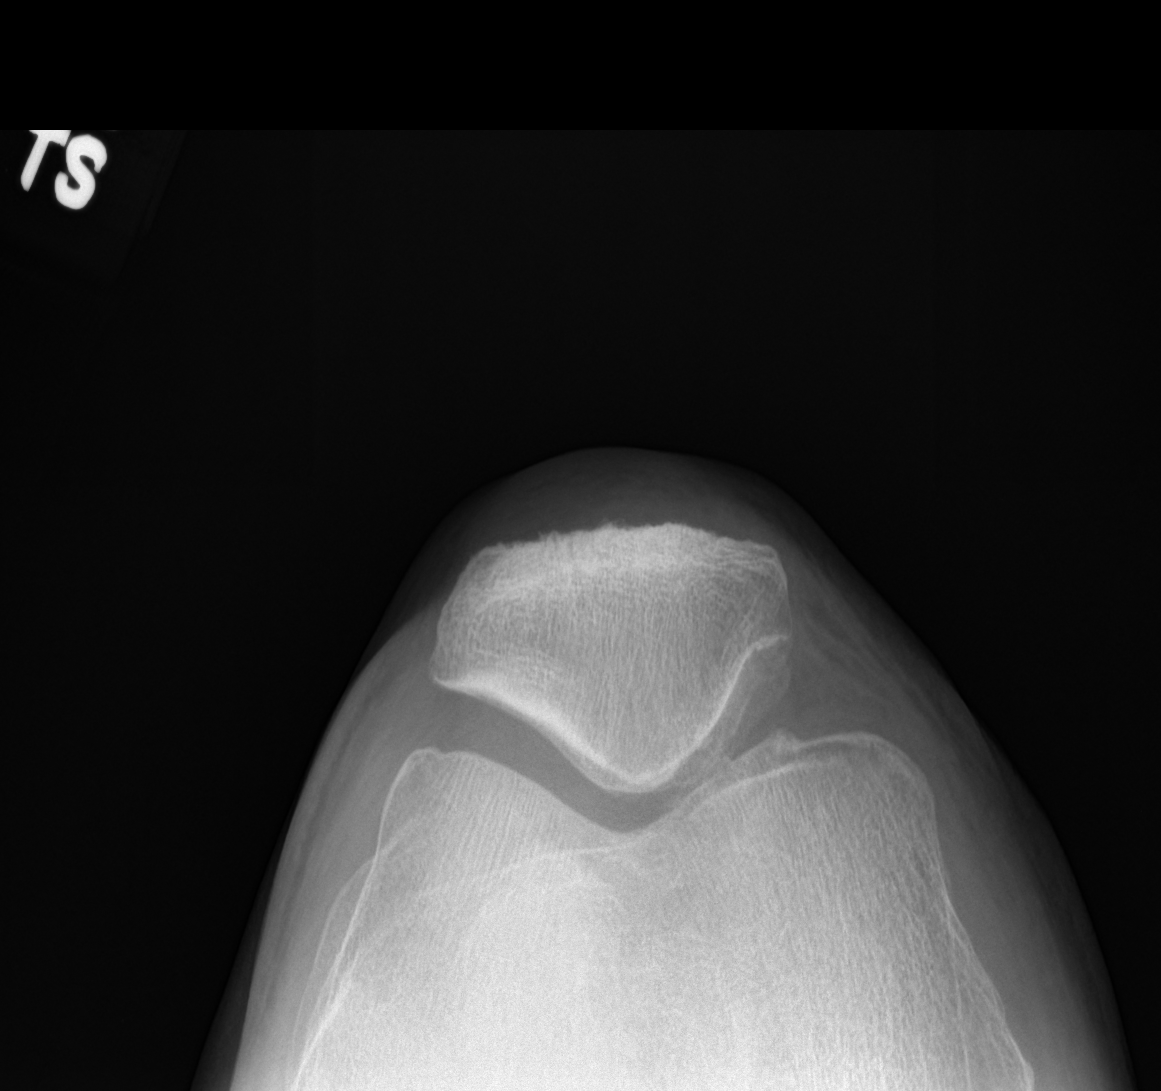

[4 of 4 positions shown; findings below may reference images not displayed]

FINDINGS: No fracture or malalignment. Lateral joint space calcification.
Trace suprapatellar effusion. Vascular calcification.

Mild degenerative change of the medial and lateral joint space
compartments. Mild to moderate patellofemoral degenerative changes.
IMPRESSION: 1. No acute osseous abnormality.
2. Mild to moderate arthritis.  Trace joint effusion

## 2019-11-07 ENCOUNTER — Telehealth: Payer: Self-pay

## 2019-11-07 NOTE — Telephone Encounter (Signed)
Copied from [place] [phone]. Topic: General - Other >> Nov 07, 2019  4:51 PM Percell Belt A wrote: Reason for CRM: pt returned Ronny Bacon call about his labs. Office not available ,

## 2019-11-10 NOTE — Telephone Encounter (Signed)
Attempted to contact patient, no answer left a voicemail. Okay for [facility] triage to answer patient's questions regarding recent labs.

## 2019-11-10 NOTE — Telephone Encounter (Signed)
Pt returned call. States he is not sure why he was called. States he does not have any questions regarding labs from 10/07/2019.  NT did not see any additional results/messages. Did review lab results with pt from 5/18; states he was aware, no further questions.

## 2019-12-21 ENCOUNTER — Encounter: Payer: Self-pay | Admitting: Emergency Medicine

## 2019-12-21 ENCOUNTER — Emergency Department
Admission: EM | Admit: 2019-12-21 | Discharge: 2019-12-21 | Disposition: A | Discharge status: Home / Self Care | Payer: PPO | Attending: Emergency Medicine | Admitting: Emergency Medicine

## 2019-12-21 ENCOUNTER — Other Ambulatory Visit: Payer: Self-pay

## 2019-12-21 ENCOUNTER — Emergency Department: Payer: PPO

## 2019-12-21 DIAGNOSIS — Z7901 Long term (current) use of anticoagulants: Secondary | ICD-10-CM | POA: Insufficient documentation

## 2019-12-21 DIAGNOSIS — I110 Hypertensive heart disease with heart failure: Secondary | ICD-10-CM | POA: Diagnosis not present

## 2019-12-21 DIAGNOSIS — I5022 Chronic systolic (congestive) heart failure: Secondary | ICD-10-CM | POA: Diagnosis not present

## 2019-12-21 DIAGNOSIS — Z79899 Other long term (current) drug therapy: Secondary | ICD-10-CM | POA: Diagnosis not present

## 2019-12-21 DIAGNOSIS — I2510 Atherosclerotic heart disease of native coronary artery without angina pectoris: Secondary | ICD-10-CM | POA: Insufficient documentation

## 2019-12-21 DIAGNOSIS — K2970 Gastritis, unspecified, without bleeding: Secondary | ICD-10-CM | POA: Insufficient documentation

## 2019-12-21 DIAGNOSIS — R002 Palpitations: Secondary | ICD-10-CM | POA: Diagnosis not present

## 2019-12-21 DIAGNOSIS — R9431 Abnormal electrocardiogram [ECG] [EKG]: Secondary | ICD-10-CM | POA: Diagnosis not present

## 2019-12-21 DIAGNOSIS — R000 Tachycardia, unspecified: Secondary | ICD-10-CM | POA: Diagnosis present

## 2019-12-21 DIAGNOSIS — R5383 Other fatigue: Secondary | ICD-10-CM | POA: Diagnosis not present

## 2019-12-21 LAB — BASIC METABOLIC PANEL
Anion gap: 10 (ref 5–15)
BUN: 33 mg/dL — ABNORMAL HIGH (ref 8–23)
CO2: 20 mmol/L — ABNORMAL LOW (ref 22–32)
Calcium: 9 mg/dL (ref 8.9–10.3)
Chloride: 110 mmol/L (ref 98–111)
Creatinine, Ser: 1.68 mg/dL — ABNORMAL HIGH (ref 0.61–1.24)
GFR calc Af Amer: 45 mL/min — ABNORMAL LOW (ref >60)
GFR calc non Af Amer: 39 mL/min — ABNORMAL LOW (ref >60)
Glucose, Bld: 116 mg/dL — ABNORMAL HIGH (ref 70–99)
Potassium: 4.3 mmol/L (ref 3.5–5.1)
Sodium: 140 mmol/L (ref 135–145)

## 2019-12-21 LAB — MAGNESIUM: Magnesium: 2.3 mg/dL (ref 1.7–2.4)

## 2019-12-21 LAB — HEPATIC FUNCTION PANEL
ALT: 15 U/L (ref 0–44)
AST: 21 U/L (ref 15–41)
Albumin: 4 g/dL (ref 3.5–5.0)
Alkaline Phosphatase: 73 U/L (ref 38–126)
Bilirubin, Direct: 0.2 mg/dL (ref 0.0–0.2)
Indirect Bilirubin: 1.2 mg/dL — ABNORMAL HIGH (ref 0.3–0.9)
Total Bilirubin: 1.4 mg/dL — ABNORMAL HIGH (ref 0.3–1.2)
Total Protein: 7.5 g/dL (ref 6.5–8.1)

## 2019-12-21 LAB — TROPONIN I (HIGH SENSITIVITY)
Troponin I (High Sensitivity): 19 ng/L — ABNORMAL HIGH (ref <18)
Troponin I (High Sensitivity): 20 ng/L — ABNORMAL HIGH (ref <18)

## 2019-12-21 LAB — CBC
HCT: 38.9 % — ABNORMAL LOW (ref 39.0–52.0)
Hemoglobin: 13.6 g/dL (ref 13.0–17.0)
MCH: 31.3 pg (ref 26.0–34.0)
MCHC: 35 g/dL (ref 30.0–36.0)
MCV: 89.4 fL (ref 80.0–100.0)
Platelets: 192 10*3/uL (ref 150–400)
RBC: 4.35 MIL/uL (ref 4.22–5.81)
RDW: 12.7 % (ref 11.5–15.5)
WBC: 9.6 10*3/uL (ref 4.0–10.5)
nRBC: 0 % (ref 0.0–0.2)

## 2019-12-21 LAB — LIPASE, BLOOD: Lipase: 28 U/L (ref 11–51)

## 2019-12-21 MED ORDER — SUCRALFATE 1 G PO TABS
1.0000 g | ORAL_TABLET | Freq: Four times a day (QID) | ORAL | 0 refills | Status: DC
Start: 2019-12-21 — End: 2020-04-28

## 2019-12-21 MED ORDER — LIDOCAINE VISCOUS HCL 2 % MT SOLN
15.0000 mL | Freq: Once | OROMUCOSAL | Status: AC
  Administered 2019-12-21: 15 mL via OROMUCOSAL
  Filled 2019-12-21: qty 15

## 2019-12-21 MED ORDER — FAMOTIDINE 20 MG PO TABS
20.0000 mg | ORAL_TABLET | Freq: Every day | ORAL | 1 refills | Status: DC
Start: 2019-12-21 — End: 2020-02-15

## 2019-12-21 NOTE — Discharge Instructions (Signed)
Please seek medical attention for any high fevers, chest pain, shortness of breath, change in behavior, persistent vomiting, bloody stool or any other new or concerning symptoms.  

## 2019-12-21 NOTE — ED Triage Notes (Addendum)
Pt has been having fatigue and he reports elevated HR into 90s with exertion.  Pt reports normal HR in low 70s.  NAD. Unlabored. No chest pain currently.  Has had decreased appetite.

## 2019-12-21 NOTE — ED Provider Notes (Signed)
[facility] Emergency Department Provider Note    ____________________________________________   I have reviewed the triage vital signs and the nursing notes.   HISTORY  Chief Complaint Fast heart rate  History limited by: Not Limited   HPI Darius Parker is a 74 y.o. male who presents to the emergency department today with primary concern for fast heart rate.  He states that he did have some abdominal discomfort this morning.  When he checked his pulse it was in 80s.  He did try to relax however his pulse remained in the mid 80s.  He states that his normal pulse was in the low 70s and normally he is able to bring it back down.  He did take a dose of diltiazem to try to help lower his heart rate.  Patient states that he has had similar abdominal pain about 1 week ago as to the discomfort he had this morning.  He describes the pain as being primarily in the upper abdomen.  He did have decreased appetite with the discomfort today.   Records reviewed. Per medical record review patient has a history of hepatitis, HLD, HTN.  Past Medical History:  Diagnosis Date  . Arthritis   . Bursitis   . Dysrhythmia   . Heart murmur   . Hepatitis   . Hypertension     Patient Active Problem List   Diagnosis Date Noted  . Family history of diabetes mellitus 08/19/2019  . Hyperglycemia 08/19/2019  . Pulmonary embolism ([place]) 02/08/2018  . CAP (community acquired pneumonia) 02/08/2018  . Essential hypertension 03/27/2017  . Chronic systolic heart failure ([place]) 03/27/2017  . Hyperlipidemia, mixed 03/27/2017  . Coronary artery disease involving native coronary artery of native heart without angina pectoris 03/27/2017    Past Surgical History:  Procedure Laterality Date  . BACK SURGERY    . CARDIAC CATHETERIZATION    . CATARACT EXTRACTION W/PHACO Right 03/01/2016   Procedure: CATARACT EXTRACTION PHACO AND INTRAOCULAR LENS PLACEMENT ([place]);  Surgeon: Estill Cotta, MD;   Location: [facility] ORS;  Service: Ophthalmology;  Laterality: Right;  [place] 1.33 AP% 21.6 CDE 37.45 Fluid Pack Lot # [id number]  . COLONOSCOPY W/ POLYPECTOMY    . TONSILLECTOMY      Prior to Admission medications   Medication Sig Start Date End Date Taking? Authorizing Provider  apixaban (ELIQUIS) 5 MG TABS tablet One tablet twice daily Patient taking differently: Take 5 mg by mouth daily. One tablet twice daily 05/06/18   Carmon Ginsberg, PA  atorvastatin (LIPITOR) 10 MG tablet Take 10 mg at bedtime by mouth. 01/21/17   [provider]  carvedilol (COREG) 12.5 MG tablet Take 12.5 mg by mouth 2 (two) times daily with a meal.    [provider]  enalapril (VASOTEC) 10 MG tablet Take 10 mg by mouth 2 (two) times daily.    [provider]  famotidine (PEPCID) 20 MG tablet TAKE 1 TABLET BY MOUTH TWICE A DAY 08/14/19   Birdie Sons, MD  furosemide (LASIX) 20 MG tablet Take 20 mg by mouth daily.    [provider]  methocarbamol (ROBAXIN) 500 MG tablet Take 1 tablet (500 mg total) by mouth 4 (four) times daily. 04/29/19   Chrismon, Vickki Muff, PA  omega-3 acid ethyl esters (LOVAZA) 1 g capsule Take 1 capsule (1 g total) by mouth 2 (two) times daily. 04/12/18   Birdie Sons, MD    Allergies Tetanus toxoids  Family History  Problem Relation Age of  Onset  . Diabetes Brother     Social History Social History   Tobacco Use  . Smoking status: Never Smoker  . Smokeless tobacco: Never Used  Substance Use Topics  . Alcohol use: No  . Drug use: Never    Review of Systems Constitutional: No fever/chills Eyes: No visual changes. ENT: No sore throat. Cardiovascular: Denies chest pain. Denies palpitations.  Respiratory: Denies shortness of breath. Gastrointestinal: Positive for abdominal pain, positive for decreased appetite.  Genitourinary: Negative for dysuria. Musculoskeletal: Negative for back pain. Skin: Negative for rash. Neurological: Negative for  headaches, focal weakness or numbness.  ____________________________________________   PHYSICAL EXAM:  VITAL SIGNS: ED Triage Vitals  Enc Vitals Group     BP 12/21/19 1253 117/71     Pulse Rate 12/21/19 1253 81     Resp 12/21/19 1253 16     Temp 12/21/19 1253 98.3 F (36.8 C)     Temp Source 12/21/19 1253 Oral     SpO2 12/21/19 1253 97 %     Weight 12/21/19 1254 170 lb (77.1 kg)     Height 12/21/19 1254 6\' 2"  (1.88 m)     Head Circumference --      Peak Flow --      Pain Score 12/21/19 1254 0   Constitutional: Alert and oriented.  Eyes: Conjunctivae are normal.  ENT      Head: Normocephalic and atraumatic.      Nose: No congestion/rhinnorhea.      Mouth/Throat: Mucous membranes are moist.      Neck: No stridor. Hematological/Lymphatic/Immunilogical: No cervical lymphadenopathy. Cardiovascular: Normal rate, regular rhythm.  No murmurs, rubs, or gallops.  Respiratory: Normal respiratory effort without tachypnea nor retractions. Breath sounds are clear and equal bilaterally. No wheezes/rales/rhonchi. Gastrointestinal: Soft and minimally tender in the epigastric region. Genitourinary: Deferred Musculoskeletal: Normal range of motion in all extremities. No lower extremity edema. Neurologic:  Normal speech and language. No gross focal neurologic deficits are appreciated.  Skin:  Skin is warm, dry and intact. No rash noted. Psychiatric: Mood and affect are normal. Speech and behavior are normal. Patient exhibits appropriate insight and judgment.  ____________________________________________    LABS (pertinent positives/negatives)  CBC wbc 9.6, hgb 13.6, plt 192 Mg 2.3 Trop hs 19 to 20 BMP na 140, k 4.3, glu 116, cr 1.68 Lipase 28 Hepatic function panel wnl except t bili 1.4, indirect bili 1.2 ____________________________________________   EKG  I, Nance Pear, attending physician, personally viewed and interpreted this EKG  EKG Time: 1232 Rate: 83 Rhythm:  normal sinus rhythm Axis: normal Intervals: qtc 502 QRS: IVCD, LVH ST changes: no st elevation  Impression: abnormal ekg   ____________________________________________    RADIOLOGY  CXR No acute process  ____________________________________________   PROCEDURES  Procedures  ____________________________________________   INITIAL IMPRESSION / ASSESSMENT AND PLAN / ED COURSE  Pertinent labs & imaging results that were available during my care of the patient were reviewed by me and considered in my medical decision making (see chart for details).   Patient presented to the emergency department today because of concerns for fast heart rate.  He states his heart rate was in the mid 80s.  I did discussion with the patient.  At this time I have lower concern for heart rate of the mid 80s.  In further discussion he has been complained of some abdominal discomfort.  I discussed with patient that that could be elevating his heart rate.  Lipase and hepatic function panel essentially normal  although he had a slight elevation of his bilirubin.  This could be related the patient not eating as much today and the discomfort.  He did feel better after viscous lidocaine.  I do think he likely suffering from some gastritis.  I discussed this with the patient.  Will plan on discharging with antiacid and sucralfate.  Patient states he has follow-up with primary care in a few weeks.  ____________________________________________   FINAL CLINICAL IMPRESSION(S) / ED DIAGNOSES  Final diagnoses:  Gastritis, presence of bleeding unspecified, unspecified chronicity, unspecified gastritis type     Note: This dictation was prepared with Dragon dictation. Any transcriptional errors that result from this process are unintentional     Nance Pear, MD 12/21/19 1739

## 2019-12-23 NOTE — Progress Notes (Signed)
Complete physical exam   Patient: Darius Parker   DOB: December 22, 1945   74 y.o. Male  MRN: [id number] Visit Date: 12/24/2019  Today's healthcare provider: Lelon Huh, MD   Chief Complaint  Patient presents with  . Annual Exam  . Hyperlipidemia  . Hyperglycemia  . ER follow up   Mertie Moores as a scribe for Lelon Huh, MD.,have documented all relevant documentation on the behalf of Lelon Huh, MD,as directed by  Lelon Huh, MD while in the presence of Lelon Huh, MD.  Subjective    Darius Parker is a 74 y.o. male who presents today for a complete physical exam.  He reports consuming a general diet. The patient does not participate in regular exercise at present. He generally feels fairly well. He reports sleeping poorly. He does not have additional problems to discuss today.   Had AWV with [facility] today at 8:20am.   HPI  Lipid/Cholesterol, Follow-up  Last lipid panel Other pertinent labs  Lab Results  Component Value Date   CHOL 124 10/07/2019   HDL 27 (L) 10/07/2019   LDLCALC 80 10/07/2019   TRIG 88 10/07/2019   CHOLHDL 4.6 10/07/2019   Lab Results  Component Value Date   ALT 15 12/21/2019   AST 21 12/21/2019   PLT 192 12/21/2019     He was last seen for this more than 1 year ago. He reports good compliance with treatment. He is not having side effects.   Symptoms: No chest pain No chest pressure/discomfort  No dyspnea No lower extremity edema  No numbness or tingling of extremity No orthopnea  No palpitations No paroxysmal nocturnal dyspnea  No speech difficulty No syncope   Current diet: in general, a "healthy" diet   Current exercise: none  The ASCVD Risk score (Ferdinand., et al., 2013) failed to calculate for the following reasons:   The valid total cholesterol range is 130 to 320 mg/dL  ---------------------------------------------------------------------------------------------------  Hyperglycemia, Follow-up  Lab  Results  Component Value Date   HGBA1C 5.5 10/07/2019   GLUCOSE 116 (H) 12/21/2019   GLUCOSE 89 10/07/2019   GLUCOSE 119 (H) 02/09/2018    Last seen for for this 4 months ago.  Management since that visit includes continuing healthy diet. Current symptoms include none   Prior visit with dietician: no Current diet: in general, a "healthy" diet   Current exercise: none  Pertinent Labs:    Component Value Date/Time   CHOL 124 10/07/2019 0813   TRIG 88 10/07/2019 0813   CHOLHDL 4.6 10/07/2019 0813   CREATININE 1.68 (H) 12/21/2019 1257    Wt Readings from Last 3 Encounters:  12/24/19 193 lb 12.8 oz (87.9 kg)  12/24/19 193 lb 12.8 oz (87.9 kg)  12/21/19 170 lb (77.1 kg)    -----------------------------------------------------------------------------------------  Follow up for Chronic systolic heart failure:  The patient was last seen for this 4 months ago. Changes made at last visit include none; stable on current medication regiment. Managed by Dr. Nehemiah Massed.  He reports good compliance with treatment. He feels that condition is stable. He is not having side effects.   -----------------------------------------------------------------------------------------   Follow up ER visit  Patient was seen in ER for fast heart rate and gastritis on 12/21/2019. His main GI symptoms was nausea. Was given prescription sucralfate and famotidine. He does report a history of stomach ulcers.  Treatment for this included giving patient viscous lidocaine. He was discharged from the ER  with antiacids and sucralfate.  He reports good compliance with treatment. He reports this condition is slightly improved, but still experiencing nausea.  -----------------------------------------------------------------------------------------   Past Medical History:  Diagnosis Date  . Arthritis   . Bursitis   . Dysrhythmia   . Heart murmur   . Hepatitis   . Hypertension   . Stomach ulcer     Past Surgical History:  Procedure Laterality Date  . BACK SURGERY    . CARDIAC CATHETERIZATION    . CATARACT EXTRACTION W/PHACO Right 03/01/2016   Procedure: CATARACT EXTRACTION PHACO AND INTRAOCULAR LENS PLACEMENT ([place]);  Surgeon: Estill Cotta, MD;  Location: [facility] ORS;  Service: Ophthalmology;  Laterality: Right;  [place] 1.33 AP% 21.6 CDE 37.45 Fluid Pack Lot # [id number]  . COLONOSCOPY W/ POLYPECTOMY    . TONSILLECTOMY     Social History   Socioeconomic History  . Marital status: Widowed    Spouse name: Not on file  . Number of children: 1  . Years of education: Not on file  . Highest education level: Associate degree: occupational, Hotel manager, or vocational program  Occupational History  . Occupation: retired    Comment: mows 4 lawns  Tobacco Use  . Smoking status: Never Smoker  . Smokeless tobacco: Never Used  Vaping Use  . Vaping Use: Never used  Substance and Sexual Activity  . Alcohol use: No  . Drug use: Never  . Sexual activity: Not on file  Other Topics Concern  . Not on file  Social History Narrative  . Not on file   Social Determinants of Health   Financial Resource Strain:   . Difficulty of Paying Living Expenses:   Food Insecurity: No Food Insecurity  . Worried About Charity fundraiser in the Last Year: Never true  . Ran Out of Food in the Last Year: Never true  Transportation Needs:   . Lack of Transportation (Medical):   Marland Kitchen Lack of Transportation (Non-Medical):   Physical Activity:   . Days of Exercise per Week:   . Minutes of Exercise per Session:   Stress:   . Feeling of Stress :   Social Connections:   . Frequency of Communication with Friends and Family:   . Frequency of Social Gatherings with Friends and Family:   . Attends Religious Services:   . Active Member of Clubs or Organizations:   . Attends Archivist Meetings:   Marland Kitchen Marital Status:   Intimate Partner Violence: Not At Risk  . Fear of Current or Ex-Partner: No  .  Emotionally Abused: No  . Physically Abused: No  . Sexually Abused: No   Family Status  Relation Name Status  . Brother  Alive   Family History  Problem Relation Age of Onset  . Diabetes Brother    Allergies  Allergen Reactions  . Tetanus Toxoids Swelling    Patient Care Team: Birdie Sons, MD as PCP - General (Family Medicine) Corey Skains, MD as Consulting Physician (Cardiology)   Medications: Outpatient Medications Prior to Visit  Medication Sig  . apixaban (ELIQUIS) 5 MG TABS tablet One tablet twice daily (Patient taking differently: Take 5 mg by mouth daily. One tablet twice daily)  . atorvastatin (LIPITOR) 10 MG tablet Take 10 mg at bedtime by mouth.  . carvedilol (COREG) 12.5 MG tablet Take 12.5 mg by mouth 2 (two) times daily with a meal.  . enalapril (VASOTEC) 10 MG tablet Take 10 mg by mouth 2 (two) times daily.  . famotidine (PEPCID) 20 MG tablet  TAKE 1 TABLET BY MOUTH TWICE A DAY  . famotidine (PEPCID) 20 MG tablet Take 1 tablet (20 mg total) by mouth daily.  . furosemide (LASIX) 20 MG tablet Take 20 mg by mouth daily.  . methocarbamol (ROBAXIN) 500 MG tablet Take 1 tablet (500 mg total) by mouth 4 (four) times daily. (Patient not taking: Reported on 12/24/2019)  . omega-3 acid ethyl esters (LOVAZA) 1 g capsule Take 1 capsule (1 g total) by mouth 2 (two) times daily.  . sucralfate (CARAFATE) 1 g tablet Take 1 tablet (1 g total) by mouth 4 (four) times daily for 7 days.   No facility-administered medications prior to visit.    Review of Systems  Constitutional: Negative.   HENT: Negative.   Eyes: Negative.   Respiratory: Negative.   Cardiovascular: Negative.   Gastrointestinal: Positive for nausea.  Endocrine: Negative.   Genitourinary: Negative.   Musculoskeletal: Negative.   Skin: Negative.   Allergic/Immunologic: Negative.   Neurological: Negative.   Hematological: Negative.   Psychiatric/Behavioral: Negative.       Objective    BP (!)  106/58 (BP Location: Right Arm, Patient Position: Sitting, Cuff Size: Normal)   Pulse 82   Temp 97.8 F (36.6 C) (Oral)   Ht 6\' 2"  (1.88 m)   Wt 193 lb 12.8 oz (87.9 kg)   SpO2 96%   BMI 24.88 kg/m    Physical Exam   General Appearance:    Well developed, well nourished male. Alert, cooperative, in no acute distress, appears stated age  Head:    Normocephalic, without obvious abnormality, atraumatic  Eyes:    PERRL, conjunctiva/corneas clear, EOM's intact, fundi    benign, both eyes       Ears:    Normal TM's and external ear canals, both ears  Neck:   Supple, symmetrical, trachea midline, no adenopathy;       thyroid:  No enlargement/tenderness/nodules; no carotid   bruit or JVD  Back:     Symmetric, no curvature, ROM normal, no CVA tenderness  Lungs:     Clear to auscultation bilaterally, respirations unlabored  Chest wall:    No tenderness or deformity  Heart:    Normal heart rate. Normal rhythm. No murmurs, rubs, or gallops.  S1 and S2 normal  Abdomen:     Soft with mild epigastric tenderness, bowel sounds active all four quadrants,    no masses, no organomegaly  Genitalia:    deferred  Rectal:    deferred  Extremities:   All extremities are intact. No cyanosis or edema  Pulses:   2+ and symmetric all extremities  Skin:   Skin color, texture, turgor normal, no rashes or lesions  Lymph nodes:   Cervical, supraclavicular, and axillary nodes normal  Neurologic:   CNII-XII intact. Normal strength, sensation and reflexes      throughout     Last depression screening scores PHQ 2/9 Scores 12/24/2019 08/19/2019  PHQ - 2 Score 2 0  PHQ- 9 Score 6 -   Last fall risk screening Fall Risk  12/24/2019  Falls in the past year? 0  Number falls in past yr: 0  Injury with Fall? 0   Last Audit-C alcohol use screening Alcohol Use Disorder Test (AUDIT) 12/24/2019  1. How often do you have a drink containing alcohol? 0  2. How many drinks containing alcohol do you have on a typical day  when you are drinking? 0  3. How often do you have six or more drinks  on one occasion? 0  AUDIT-C Score 0  Alcohol Brief Interventions/Follow-up AUDIT Score <7 follow-up not indicated   A score of 3 or more in women, and 4 or more in men indicates increased risk for alcohol abuse, EXCEPT if all of the points are from question 1   No results found for any visits on 12/24/19.  Assessment & Plan    Routine Health Maintenance and Physical Exam  Exercise Activities and Dietary recommendations Goals   None     Immunization History  Administered Date(s) Administered  . PFIZER SARS-COV-2 Vaccination 07/24/2019, 08/19/2019    Health Maintenance  Topic Date Due  . Hepatitis C Screening  Never done  . TETANUS/TDAP  Never done  . PNA vac Low Risk Adult (1 of 2 - PCV13) Never done  . INFLUENZA VACCINE  12/21/2019  . COLONOSCOPY  10/03/2020  . COVID-19 Vaccine  Completed    Discussed health benefits of physical activity, and encouraged him to engage in regular exercise appropriate for his age and condition.  1. Annual physical exam   2. Other acute gastritis without hemorrhage Improved since ER visit when he was prescribed famotidine and sucralfate .  - H. pylori breath test  3. Essential hypertension Well controlled.  Continue current medications.    - TSH  4. Hyperglycemia Last a1c was 5.5 on 10/07/2019   5. Chronic systolic CHF (congestive heart failure), NYHA class 2 (HCC) Stable, continue current medications and follow up Dr. Nehemiah Massed as scheduled.   6. Need for hepatitis C screening test  - Hepatitis C antibody  7. Prostate cancer screening  - PSA  8. Need for vaccination against Streptococcus pneumoniae  - Pneumococcal polysaccharide vaccine 23-valent greater than or equal to 2yo subcutaneous/IM  9. Coronary artery disease involving native coronary artery of native heart without angina pectoris Asymptomatic. Compliant with medication.  Continue aggressive  risk factor modification.  Continue follow up Dr. Nehemiah Massed as scheduled.   10. Hyperlipidemia, mixed He is tolerating atorvastatin well with no adverse effects.   - Lipid panel - TSH  11. Other pulmonary embolism without acute cor pulmonale, unspecified chronicity (HCC) Continue Eliquis which he is tolerating well.   No follow-ups on file.     The entirety of the information documented in the History of Present Illness, Review of Systems and Physical Exam were personally obtained by me. Portions of this information were initially documented by the CMA and reviewed by me for thoroughness and accuracy.      Lelon Huh, MD  [facility] [phone] (phone) [phone] (fax)  [place]

## 2019-12-23 NOTE — Progress Notes (Signed)
Subjective:   Darius Parker is a 74 y.o. male who presents for Medicare Annual/Subsequent preventive examination.  Review of Systems    N/A  Cardiac Risk Factors include: advanced age (>37men, >44 women);dyslipidemia;hypertension;male gender     Objective:    Today's Vitals   12/24/19 0815  BP: (!) 106/58  Pulse: 82  Temp: 97.8 F (36.6 C)  TempSrc: Oral  SpO2: 96%  Weight: 193 lb 12.8 oz (87.9 kg)  Height: 6\' 2"  (1.88 m)  PainSc: 0-No pain   Body mass index is 24.88 kg/m.  Advanced Directives 12/24/2019 12/21/2019 02/09/2018 02/08/2018  Does Patient Have a Medical Advance Directive? No No No No  Would patient like information on creating a medical advance directive? Yes (MAU/Ambulatory/Procedural Areas - Information given) - No - Patient declined -    Current Medications (verified) Outpatient Encounter Medications as of 12/24/2019  Medication Sig   apixaban (ELIQUIS) 5 MG TABS tablet One tablet twice daily (Patient taking differently: Take 5 mg by mouth daily. One tablet twice daily)   atorvastatin (LIPITOR) 10 MG tablet Take 10 mg at bedtime by mouth.   carvedilol (COREG) 12.5 MG tablet Take 12.5 mg by mouth 2 (two) times daily with a meal.   enalapril (VASOTEC) 10 MG tablet Take 10 mg by mouth 2 (two) times daily.   furosemide (LASIX) 20 MG tablet Take 20 mg by mouth daily.   omega-3 acid ethyl esters (LOVAZA) 1 g capsule Take 1 capsule (1 g total) by mouth 2 (two) times daily.   sucralfate (CARAFATE) 1 g tablet Take 1 tablet (1 g total) by mouth 4 (four) times daily for 7 days.   famotidine (PEPCID) 20 MG tablet TAKE 1 TABLET BY MOUTH TWICE A DAY   famotidine (PEPCID) 20 MG tablet Take 1 tablet (20 mg total) by mouth daily.   methocarbamol (ROBAXIN) 500 MG tablet Take 1 tablet (500 mg total) by mouth 4 (four) times daily. (Patient not taking: Reported on 12/24/2019)   No facility-administered encounter medications on file as of 12/24/2019.    Allergies  (verified) Tetanus toxoids   History: Past Medical History:  Diagnosis Date   Arthritis    Bursitis    Dysrhythmia    Heart murmur    Hepatitis    Hypertension    Stomach ulcer    Past Surgical History:  Procedure Laterality Date   BACK SURGERY     CARDIAC CATHETERIZATION     CATARACT EXTRACTION W/PHACO Right 03/01/2016   Procedure: CATARACT EXTRACTION PHACO AND INTRAOCULAR LENS PLACEMENT ([place]);  Surgeon: Estill Cotta, MD;  Location: [facility] ORS;  Service: Ophthalmology;  Laterality: Right;  [place] 1.33 AP% 21.6 CDE 37.45 Fluid Pack Lot # [id number]   COLONOSCOPY W/ POLYPECTOMY     TONSILLECTOMY     Family History  Problem Relation Age of Onset   Diabetes Brother    Social History   Socioeconomic History   Marital status: Widowed    Spouse name: Not on file   Number of children: 1   Years of education: Not on file   Highest education level: Associate degree: occupational, Hotel manager, or vocational program  Occupational History   Occupation: retired    Comment: mows 4 lawns  Tobacco Use   Smoking status: Never Smoker   Smokeless tobacco: Never Used  Scientific laboratory technician Use: Never used  Substance and Sexual Activity   Alcohol use: No   Drug use: Never   Sexual activity: Not on file  Other Topics Concern   Not on file  Social History Narrative   Not on file   Social Determinants of Health   Financial Resource Strain: Low Risk    Difficulty of Paying Living Expenses: Not hard at all  Food Insecurity: No Food Insecurity   Worried About Charity fundraiser in the Last Year: Never true   [place] in the Last Year: Never true  Transportation Needs: No Transportation Needs   Lack of Transportation (Medical): No   Lack of Transportation (Non-Medical): No  Physical Activity: Inactive   Days of Exercise per Week: 0 days   Minutes of Exercise per Session: 0 min  Stress: No Stress Concern Present   Feeling of Stress : Not at  all  Social Connections: Moderately Isolated   Frequency of Communication with Friends and Family: Not on file   Frequency of Social Gatherings with Friends and Family: More than three times a week   Attends Religious Services: Never   Marine scientist or Organizations: Yes   Attends Music therapist: More than 4 times per year   Marital Status: Widowed    Tobacco Counseling Counseling given: Not Answered   Clinical Intake:  Pre-visit preparation completed: Yes  Pain : No/denies pain Pain Score: 0-No pain     Nutritional Status: BMI of 19-24  Normal Nutritional Risks: Nausea/ vomitting/ diarrhea (Nausea occasionally from a stomach ulcer.) Diabetes: No  How often do you need to have someone help you when you read instructions, pamphlets, or other written materials from your doctor or pharmacy?: 1 - Never  Diabetic? No  Interpreter Needed?: No  Information entered by :: [facility], LPN   Activities of Daily Living In your present state of health, do you have any difficulty performing the following activities: 12/24/2019 08/19/2019  Hearing? N N  Vision? N Y  Comment Wears eye glasses. -  Difficulty concentrating or making decisions? N N  Walking or climbing stairs? N N  Dressing or bathing? N N  Doing errands, shopping? N N  Preparing Food and eating ? N -  Using the Toilet? N -  In the past six months, have you accidently leaked urine? N -  Do you have problems with loss of bowel control? N -  Managing your Medications? N -  Managing your Finances? N -  Housekeeping or managing your Housekeeping? N -  Some recent data might be hidden    Patient Care Team: Birdie Sons, MD as PCP - General (Family Medicine) Corey Skains, MD as Consulting Physician (Cardiology) [facility], [facility])  Indicate any recent Medical Services you may have received from other than [facility] providers in the past year (date may be approximate).      Assessment:   This is a routine wellness examination for Darius Parker.  Hearing/Vision screen No exam data present  Dietary issues and exercise activities discussed: Current Exercise Habits: The patient does not participate in regular exercise at present, Exercise limited by: None identified  Goals     DIET - DECREASE SODA OR JUICE INTAKE     Recommend to cut back on soda intake to no more than 1 a day and increase water intake.       Depression Screen PHQ 2/9 Scores 12/24/2019 08/19/2019  PHQ - 2 Score 2 0  PHQ- 9 Score 6 -    Fall Risk Fall Risk  12/24/2019  Falls in the past year? 0  Number  falls in past yr: 0  Injury with Fall? 0    Any stairs in or around the home? No  If so, are there any without handrails? No  Home free of loose throw rugs in walkways, pet beds, electrical cords, etc? Yes  Adequate lighting in your home to reduce risk of falls? Yes   ASSISTIVE DEVICES UTILIZED TO PREVENT FALLS:  Life alert? No  Use of a cane, walker or w/c? No  Grab bars in the bathroom? Yes  Shower chair or bench in shower? No  Elevated toilet seat or a handicapped toilet? Yes   TIMED UP AND GO:  Was the test performed? Yes .  Length of time to ambulate 10 feet: 10 sec.   Gait steady and fast without use of assistive device  Cognitive Function: Declined today.         Immunizations Immunization History  Administered Date(s) Administered   PFIZER SARS-COV-2 Vaccination 07/24/2019, 08/19/2019    TDAP status: Due, Education has been provided regarding the importance of this vaccine. Advised may receive this vaccine at local pharmacy or Health Dept. Aware to provide a copy of the vaccination record if obtained from local pharmacy or Health Dept. Verbalized acceptance and understanding. Flu Vaccine status: Due fall 2021 Pneumococcal vaccine status: Declined,  Education has been provided regarding the importance of this vaccine but patient still declined. Advised may receive  this vaccine at local pharmacy or Health Dept. Aware to provide a copy of the vaccination record if obtained from local pharmacy or Health Dept. Verbalized acceptance and understanding.  Covid-19 vaccine status: Completed vaccines  Qualifies for Shingles Vaccine? Yes   Zostavax completed No   Shingrix Completed?: No.    Education has been provided regarding the importance of this vaccine. Patient has been advised to call insurance company to determine out of pocket expense if they have not yet received this vaccine. Advised may also receive vaccine at local pharmacy or Health Dept. Verbalized acceptance and understanding.  Screening Tests Health Maintenance  Topic Date Due   Hepatitis C Screening  Never done   PNA vac Low Risk Adult (1 of 2 - PCV13) Never done   INFLUENZA VACCINE  12/21/2019   TETANUS/TDAP  12/23/2020 (Originally 05/11/1965)   COLONOSCOPY  10/03/2020   COVID-19 Vaccine  Completed    Health Maintenance  Health Maintenance Due  Topic Date Due   Hepatitis C Screening  Never done   PNA vac Low Risk Adult (1 of 2 - PCV13) Never done   INFLUENZA VACCINE  12/21/2019    Colorectal cancer screening: Completed 10/04/10. Repeat every 10 years  Lung Cancer Screening: (Low Dose CT Chest recommended if Age 16-80 years, 30 pack-year currently smoking OR have quit w/in 15years.) does not qualify.   Additional Screening:  Hepatitis C Screening: does qualify; however declined order at this time.  Vision Screening: Recommended annual ophthalmology exams for early detection of glaucoma and other disorders of the eye. Is the patient up to date with their annual eye exam?  Yes  Who is the provider or what is the name of the office in which the patient attends annual eye exams? [facility] If pt is not established with a provider, would they like to be referred to a provider to establish care? No .   Dental Screening: Recommended annual dental exams for proper oral  hygiene  Community Resource Referral / Chronic Care Management: CRR required this visit?  No   CCM required this visit?  No      Plan:     I have personally reviewed and noted the following in the patients chart:    Medical and social history  Use of alcohol, tobacco or illicit drugs   Current medications and supplements  Functional ability and status  Nutritional status  Physical activity  Advanced directives  List of other physicians  Hospitalizations, surgeries, and ER visits in previous 12 months  Vitals  Screenings to include cognitive, depression, and falls  Referrals and appointments  In addition, I have reviewed and discussed with patient certain preventive protocols, quality metrics, and best practice recommendations. A written personalized care plan for preventive services as well as general preventive health recommendations were provided to patient.     Navi Ewton [place], [place]   0/10/8401   Nurse Notes: Declined receiving a pneumonia vaccine or Hep C lab order today.

## 2019-12-24 ENCOUNTER — Other Ambulatory Visit: Payer: Self-pay

## 2019-12-24 ENCOUNTER — Ambulatory Visit (INDEPENDENT_AMBULATORY_CARE_PROVIDER_SITE_OTHER): Payer: PPO

## 2019-12-24 ENCOUNTER — Encounter: Payer: Self-pay | Admitting: Family Medicine

## 2019-12-24 ENCOUNTER — Ambulatory Visit (INDEPENDENT_AMBULATORY_CARE_PROVIDER_SITE_OTHER): Payer: PPO | Admitting: Family Medicine

## 2019-12-24 VITALS — BP 106/58 | HR 82 | Temp 97.8°F | Ht 74.0 in | Wt 193.8 lb

## 2019-12-24 DIAGNOSIS — I2699 Other pulmonary embolism without acute cor pulmonale: Secondary | ICD-10-CM

## 2019-12-24 DIAGNOSIS — R739 Hyperglycemia, unspecified: Secondary | ICD-10-CM

## 2019-12-24 DIAGNOSIS — Z1159 Encounter for screening for other viral diseases: Secondary | ICD-10-CM

## 2019-12-24 DIAGNOSIS — I5022 Chronic systolic (congestive) heart failure: Secondary | ICD-10-CM | POA: Diagnosis not present

## 2019-12-24 DIAGNOSIS — Z0000 Encounter for general adult medical examination without abnormal findings: Secondary | ICD-10-CM

## 2019-12-24 DIAGNOSIS — Z125 Encounter for screening for malignant neoplasm of prostate: Secondary | ICD-10-CM

## 2019-12-24 DIAGNOSIS — Z23 Encounter for immunization: Secondary | ICD-10-CM | POA: Diagnosis not present

## 2019-12-24 DIAGNOSIS — K2900 Acute gastritis without bleeding: Secondary | ICD-10-CM

## 2019-12-24 DIAGNOSIS — I10 Essential (primary) hypertension: Secondary | ICD-10-CM | POA: Diagnosis not present

## 2019-12-24 DIAGNOSIS — E782 Mixed hyperlipidemia: Secondary | ICD-10-CM | POA: Diagnosis not present

## 2019-12-24 DIAGNOSIS — I2510 Atherosclerotic heart disease of native coronary artery without angina pectoris: Secondary | ICD-10-CM | POA: Diagnosis not present

## 2019-12-24 NOTE — Patient Instructions (Signed)
Darius Parker , Thank you for taking time to come for your Medicare Wellness Visit. I appreciate your ongoing commitment to your health goals. Please review the following plan we discussed and let me know if I can assist you in the future.   Screening recommendations/referrals: Colonoscopy: Up to date, due 09/2020 Recommended yearly ophthalmology/optometry visit for glaucoma screening and checkup Recommended yearly dental visit for hygiene and checkup  Vaccinations: Influenza vaccine: Due fall 2021 Pneumococcal vaccine: Currently due, declined today. Tdap vaccine: Currently due, declined today. Shingles vaccine: Shingrix discussed. Please contact your pharmacy for coverage information.     Advanced directives: Advance directive discussed with you today. I have provided a copy for you to complete at home and have notarized. Once this is complete please bring a copy in to our office so we can scan it into your chart.  Conditions/risks identified: Recommend to cut back on soda intake to no more than 1 a day and increase water intake.   Next appointment: 9:00 AM today with Dr Caryn Section   Preventive Care 76 Years and Older, Male Preventive care refers to lifestyle choices and visits with your health care provider that can promote health and wellness. What does preventive care include?  A yearly physical exam. This is also called an annual well check.  Dental exams once or twice a year.  Routine eye exams. Ask your health care provider how often you should have your eyes checked.  Personal lifestyle choices, including:  Daily care of your teeth and gums.  Regular physical activity.  Eating a healthy diet.  Avoiding tobacco and drug use.  Limiting alcohol use.  Practicing safe sex.  Taking low doses of aspirin every day.  Taking vitamin and mineral supplements as recommended by your health care provider. What happens during an annual well check? The services and screenings done by  your health care provider during your annual well check will depend on your age, overall health, lifestyle risk factors, and family history of disease. Counseling  Your health care provider may ask you questions about your:  Alcohol use.  Tobacco use.  Drug use.  Emotional well-being.  Home and relationship well-being.  Sexual activity.  Eating habits.  History of falls.  Memory and ability to understand (cognition).  Work and work Statistician. Screening  You may have the following tests or measurements:  Height, weight, and BMI.  Blood pressure.  Lipid and cholesterol levels. These may be checked every 5 years, or more frequently if you are over 4 years old.  Skin check.  Lung cancer screening. You may have this screening every year starting at age 35 if you have a 30-pack-year history of smoking and currently smoke or have quit within the past 15 years.  Fecal occult blood test (FOBT) of the stool. You may have this test every year starting at age 76.  Flexible sigmoidoscopy or colonoscopy. You may have a sigmoidoscopy every 5 years or a colonoscopy every 10 years starting at age 38.  Prostate cancer screening. Recommendations will vary depending on your family history and other risks.  Hepatitis C blood test.  Hepatitis B blood test.  Sexually transmitted disease (STD) testing.  Diabetes screening. This is done by checking your blood sugar (glucose) after you have not eaten for a while (fasting). You may have this done every 1-3 years.  Abdominal aortic aneurysm (AAA) screening. You may need this if you are a current or former smoker.  Osteoporosis. You may be screened starting at  age 48 if you are at high risk. Talk with your health care provider about your test results, treatment options, and if necessary, the need for more tests. Vaccines  Your health care provider may recommend certain vaccines, such as:  Influenza vaccine. This is recommended every  year.  Tetanus, diphtheria, and acellular pertussis (Tdap, Td) vaccine. You may need a Td booster every 10 years.  Zoster vaccine. You may need this after age 70.  Pneumococcal 13-valent conjugate (PCV13) vaccine. One dose is recommended after age 26.  Pneumococcal polysaccharide (PPSV23) vaccine. One dose is recommended after age 55. Talk to your health care provider about which screenings and vaccines you need and how often you need them. This information is not intended to replace advice given to you by your health care provider. Make sure you discuss any questions you have with your health care provider. Document Released: 06/04/2015 Document Revised: 01/26/2016 Document Reviewed: 03/09/2015 Elsevier Interactive Patient Education  2017 [place] Prevention in the Home Falls can cause injuries. They can happen to people of all ages. There are many things you can do to make your home safe and to help prevent falls. What can I do on the outside of my home?  Regularly fix the edges of walkways and driveways and fix any cracks.  Remove anything that might make you trip as you walk through a door, such as a raised step or threshold.  Trim any bushes or trees on the path to your home.  Use bright outdoor lighting.  Clear any walking paths of anything that might make someone trip, such as rocks or tools.  Regularly check to see if handrails are loose or broken. Make sure that both sides of any steps have handrails.  Any raised decks and porches should have guardrails on the edges.  Have any leaves, snow, or ice cleared regularly.  Use sand or salt on walking paths during winter.  Clean up any spills in your garage right away. This includes oil or grease spills. What can I do in the bathroom?  Use night lights.  Install grab bars by the toilet and in the tub and shower. Do not use towel bars as grab bars.  Use non-skid mats or decals in the tub or shower.  If you  need to sit down in the shower, use a plastic, non-slip stool.  Keep the floor dry. Clean up any water that spills on the floor as soon as it happens.  Remove soap buildup in the tub or shower regularly.  Attach bath mats securely with double-sided non-slip rug tape.  Do not have throw rugs and other things on the floor that can make you trip. What can I do in the bedroom?  Use night lights.  Make sure that you have a light by your bed that is easy to reach.  Do not use any sheets or blankets that are too big for your bed. They should not hang down onto the floor.  Have a firm chair that has side arms. You can use this for support while you get dressed.  Do not have throw rugs and other things on the floor that can make you trip. What can I do in the kitchen?  Clean up any spills right away.  Avoid walking on wet floors.  Keep items that you use a lot in easy-to-reach places.  If you need to reach something above you, use a strong step stool that has a grab bar.  Keep electrical cords out of the way.  Do not use floor polish or wax that makes floors slippery. If you must use wax, use non-skid floor wax.  Do not have throw rugs and other things on the floor that can make you trip. What can I do with my stairs?  Do not leave any items on the stairs.  Make sure that there are handrails on both sides of the stairs and use them. Fix handrails that are broken or loose. Make sure that handrails are as long as the stairways.  Check any carpeting to make sure that it is firmly attached to the stairs. Fix any carpet that is loose or worn.  Avoid having throw rugs at the top or bottom of the stairs. If you do have throw rugs, attach them to the floor with carpet tape.  Make sure that you have a light switch at the top of the stairs and the bottom of the stairs. If you do not have them, ask someone to add them for you. What else can I do to help prevent falls?  Wear shoes  that:  Do not have high heels.  Have rubber bottoms.  Are comfortable and fit you well.  Are closed at the toe. Do not wear sandals.  If you use a stepladder:  Make sure that it is fully opened. Do not climb a closed stepladder.  Make sure that both sides of the stepladder are locked into place.  Ask someone to hold it for you, if possible.  Clearly mark and make sure that you can see:  Any grab bars or handrails.  First and last steps.  Where the edge of each step is.  Use tools that help you move around (mobility aids) if they are needed. These include:  Canes.  Walkers.  Scooters.  Crutches.  Turn on the lights when you go into a dark area. Replace any light bulbs as soon as they burn out.  Set up your furniture so you have a clear path. Avoid moving your furniture around.  If any of your floors are uneven, fix them.  If there are any pets around you, be aware of where they are.  Review your medicines with your doctor. Some medicines can make you feel dizzy. This can increase your chance of falling. Ask your doctor what other things that you can do to help prevent falls. This information is not intended to replace advice given to you by your health care provider. Make sure you discuss any questions you have with your health care provider. Document Released: 03/04/2009 Document Revised: 10/14/2015 Document Reviewed: 06/12/2014 Elsevier Interactive Patient Education  2017 [employer].

## 2019-12-25 ENCOUNTER — Other Ambulatory Visit: Payer: Self-pay | Admitting: Family Medicine

## 2019-12-25 DIAGNOSIS — R972 Elevated prostate specific antigen [PSA]: Secondary | ICD-10-CM

## 2019-12-25 LAB — TSH: TSH: 5.61 u[IU]/mL — ABNORMAL HIGH (ref 0.450–4.500)

## 2019-12-25 LAB — LIPID PANEL
Chol/HDL Ratio: 4.4 ratio (ref 0.0–5.0)
Cholesterol, Total: 110 mg/dL (ref 100–199)
HDL: 25 mg/dL — ABNORMAL LOW (ref >39)
LDL Chol Calc (NIH): 67 mg/dL (ref 0–99)
Triglycerides: 91 mg/dL (ref 0–149)
VLDL Cholesterol Cal: 18 mg/dL (ref 5–40)

## 2019-12-25 LAB — PSA: Prostate Specific Ag, Serum: 8.8 ng/mL — ABNORMAL HIGH (ref 0.0–4.0)

## 2019-12-25 LAB — HEPATITIS C ANTIBODY: Hep C Virus Ab: <0.1 s/co ratio (ref 0.0–0.9)

## 2019-12-26 LAB — H. PYLORI BREATH TEST: H pylori Breath Test: NEGATIVE

## 2020-01-29 ENCOUNTER — Other Ambulatory Visit: Payer: Self-pay

## 2020-01-29 ENCOUNTER — Telehealth: Payer: Self-pay | Admitting: Family Medicine

## 2020-01-29 DIAGNOSIS — R972 Elevated prostate specific antigen [PSA]: Secondary | ICD-10-CM

## 2020-01-29 NOTE — Telephone Encounter (Signed)
Please remind patient it is time to recheck PSA since it was high when we checked at last month. Future order is already placed. He does not need to fast.

## 2020-01-29 NOTE — Telephone Encounter (Signed)
Patient has been advised. KW 

## 2020-01-30 LAB — PSA TOTAL (REFLEX TO FREE): Prostate Specific Ag, Serum: 6.7 ng/mL — ABNORMAL HIGH (ref 0.0–4.0)

## 2020-01-30 LAB — FPSA% REFLEX
% FREE PSA: 32.5 %
PSA, FREE: 2.18 ng/mL

## 2020-02-14 ENCOUNTER — Other Ambulatory Visit: Payer: Self-pay | Admitting: Family Medicine

## 2020-02-14 DIAGNOSIS — R1319 Other dysphagia: Secondary | ICD-10-CM

## 2020-02-14 NOTE — Telephone Encounter (Signed)
  Notes to clinic Duplicate meds on current med list. Please assess.

## 2020-03-10 ENCOUNTER — Ambulatory Visit: Payer: Self-pay

## 2020-03-10 ENCOUNTER — Other Ambulatory Visit: Payer: Self-pay

## 2020-03-10 ENCOUNTER — Ambulatory Visit (INDEPENDENT_AMBULATORY_CARE_PROVIDER_SITE_OTHER): Payer: PPO | Admitting: Adult Health

## 2020-03-10 ENCOUNTER — Encounter: Payer: Self-pay | Admitting: Adult Health

## 2020-03-10 VITALS — BP 106/68 | HR 92 | Temp 97.6°F | Resp 16 | Wt 199.2 lb

## 2020-03-10 DIAGNOSIS — R972 Elevated prostate specific antigen [PSA]: Secondary | ICD-10-CM

## 2020-03-10 DIAGNOSIS — R350 Frequency of micturition: Secondary | ICD-10-CM | POA: Diagnosis not present

## 2020-03-10 DIAGNOSIS — R82998 Other abnormal findings in urine: Secondary | ICD-10-CM

## 2020-03-10 LAB — POCT URINALYSIS DIPSTICK
Bilirubin, UA: NEGATIVE
Glucose, UA: NEGATIVE
Ketones, UA: NEGATIVE
Nitrite, UA: NEGATIVE
Protein, UA: POSITIVE — AB
Spec Grav, UA: 1.01 (ref 1.010–1.025)
Urobilinogen, UA: 1 E.U./dL
pH, UA: 5 (ref 5.0–8.0)

## 2020-03-10 MED ORDER — CEPHALEXIN 500 MG PO CAPS: 500.0000 mg | ORAL_CAPSULE | Freq: Two times a day (BID) | ORAL | 0 refills | Status: DC

## 2020-03-10 NOTE — Patient Instructions (Signed)
Cephalexin Tablets or Capsules What is this medicine? CEPHALEXIN (sef a LEX in) is a cephalosporin antibiotic. It treats some infections caused by bacteria. It will not work for colds, the flu, or other viruses. This medicine may be used for other purposes; ask your health care provider or pharmacist if you have questions. COMMON BRAND NAME(S): Biocef, Daxbia, Keflex, Keftab What should I tell my health care provider before I take this medicine? They need to know if you have any of these conditions:  kidney disease  stomach or intestine problems, especially colitis  an unusual or allergic reaction to cephalexin, other cephalosporins, penicillins, other antibiotics, medicines, foods, dyes or preservatives  pregnant or trying to get pregnant  breast-feeding How should I use this medicine? Take this drug by mouth. Take it as directed on the prescription label at the same time every day. You can take it with or without food. If it upsets your stomach, take it with food. Take all of this drug unless your health care provider tells you to stop it early. Keep taking it even if you think you are better. Talk to your health care provider about the use of this drug in children. While it may be prescribed for selected conditions, precautions do apply. Overdosage: If you think you have taken too much of this medicine contact a poison control center or emergency room at once. NOTE: This medicine is only for you. Do not share this medicine with others. What if I miss a dose? If you miss a dose, take it as soon as you can. If it is almost time for your next dose, take only that dose. Do not take double or extra doses. What may interact with this medicine?  probenecid  some other antibiotics This list may not describe all possible interactions. Give your health care provider a list of all the medicines, herbs, non-prescription drugs, or dietary supplements you use. Also tell them if you smoke, drink  alcohol, or use illegal drugs. Some items may interact with your medicine. What should I watch for while using this medicine? Tell your doctor or health care provider if your symptoms do not begin to improve in a few days. This medicine may cause serious skin reactions. They can happen weeks to months after starting the medicine. Contact your health care provider right away if you notice fevers or flu-like symptoms with a rash. The rash may be red or purple and then turn into blisters or peeling of the skin. Or, you might notice a red rash with swelling of the face, lips or lymph nodes in your neck or under your arms. Do not treat diarrhea with over the counter products. Contact your doctor if you have diarrhea that lasts more than 2 days or if it is severe and watery. If you have diabetes, you may get a false-positive result for sugar in your urine. Check with your doctor or health care provider. What side effects may I notice from receiving this medicine? Side effects that you should report to your doctor or health care professional as soon as possible:  allergic reactions like skin rash, itching or hives, swelling of the face, lips, or tongue  breathing problems  pain or trouble passing urine  redness, blistering, peeling or loosening of the skin, including inside the mouth  severe or watery diarrhea  unusually weak or tired  yellowing of the eyes, skin Side effects that usually do not require medical attention (report to your doctor or health care professional   if they continue or are bothersome):  gas or heartburn  genital or anal irritation  headache  joint or muscle pain  nausea, vomiting This list may not describe all possible side effects. Call your doctor for medical advice about side effects. You may report side effects to FDA at 1-800-FDA-1088. Where should I keep my medicine? Keep out of the reach of children and pets. Store at room temperature between 20 and 25  degrees C (68 and 77 degrees F). Throw away any unused drug after the expiration date. NOTE: This sheet is a summary. It may not cover all possible information. If you have questions about this medicine, talk to your doctor, pharmacist, or health care provider.  2020 Elsevier/[employer] Standard (2018-12-13 11:27:00) Urinary Tract Infection, Adult A urinary tract infection (UTI) is an infection of any part of the urinary tract. The urinary tract includes:  The kidneys.  The ureters.  The bladder.  The urethra. These organs make, store, and get rid of pee (urine) in the body. What are the causes? This is caused by germs (bacteria) in your genital area. These germs grow and cause swelling (inflammation) of your urinary tract. What increases the risk? You are more likely to develop this condition if:  You have a small, thin tube (catheter) to drain pee.  You cannot control when you pee or poop (incontinence).  You are male, and: ? You use these methods to prevent pregnancy:  A medicine that kills sperm (spermicide).  A device that blocks sperm (diaphragm). ? You have low levels of a male hormone (estrogen). ? You are pregnant.  You have genes that add to your risk.  You are sexually active.  You take antibiotic medicines.  You have trouble peeing because of: ? A prostate that is bigger than normal, if you are male. ? A blockage in the part of your body that drains pee from the bladder (urethra). ? A kidney stone. ? A nerve condition that affects your bladder (neurogenic bladder). ? Not getting enough to drink. ? Not peeing often enough.  You have other conditions, such as: ? Diabetes. ? A weak disease-fighting system (immune system). ? Sickle cell disease. ? Gout. ? Injury of the spine. What are the signs or symptoms? Symptoms of this condition include:  Needing to pee right away (urgently).  Peeing often.  Peeing small amounts often.  Pain or burning when  peeing.  Blood in the pee.  Pee that smells bad or not like normal.  Trouble peeing.  Pee that is cloudy.  Fluid coming from the vagina, if you are male.  Pain in the belly or lower back. Other symptoms include:  Throwing up (vomiting).  No urge to eat.  Feeling mixed up (confused).  Being tired and grouchy (irritable).  A fever.  Watery poop (diarrhea). How is this treated? This condition may be treated with:  Antibiotic medicine.  Other medicines.  Drinking enough water. Follow these instructions at home:  Medicines  Take over-the-counter and prescription medicines only as told by your doctor.  If you were prescribed an antibiotic medicine, take it as told by your doctor. Do not stop taking it even if you start to feel better. General instructions  Make sure you: ? Pee until your bladder is empty. ? Do not hold pee for a long time. ? Empty your bladder after sex. ? Wipe from front to back after pooping if you are a male. Use each tissue one time when you wipe.    Drink enough fluid to keep your pee pale yellow.  Keep all follow-up visits as told by your doctor. This is important. Contact a doctor if:  You do not get better after 1-2 days.  Your symptoms go away and then come back. Get help right away if:  You have very bad back pain.  You have very bad pain in your lower belly.  You have a fever.  You are sick to your stomach (nauseous).  You are throwing up. Summary  A urinary tract infection (UTI) is an infection of any part of the urinary tract.  This condition is caused by germs in your genital area.  There are many risk factors for a UTI. These include having a small, thin tube to drain pee and not being able to control when you pee or poop.  Treatment includes antibiotic medicines for germs.  Drink enough fluid to keep your pee pale yellow. This information is not intended to replace advice given to you by your health care  provider. Make sure you discuss any questions you have with your health care provider. Document Revised: 04/25/2018 Document Reviewed: 11/15/2017 Elsevier Patient Education  2020 Elsevier Inc.  

## 2020-03-10 NOTE — Progress Notes (Signed)
Placed on Keflex 500mg  BID x 7 days. Sent for culture.

## 2020-03-10 NOTE — Progress Notes (Signed)
Established patient visit   Patient: Darius Parker   DOB: 25-Jul-1945   74 y.o. Male  MRN: [id number] Visit Date: 03/10/2020  Today's healthcare provider: Marcille Buffy, FNP   Chief Complaint  Patient presents with  . Urinary Frequency   Subjective    Urinary Frequency  This is a new problem. The current episode started yesterday. The problem occurs every urination. The patient is experiencing no pain. There has been no fever. Associated symptoms include frequency. Pertinent negatives include no chills, discharge, flank pain, hematuria, hesitancy, nausea, possible pregnancy, sweats, urgency or vomiting. Associated symptoms comments: History of elevated PSA last 1 month ago . He has tried nothing for the symptoms.    Onset was two days ago. Denies any back pain.  Denies any dysuria.    Denies any rectal pain or pressure.  He does endorse urinary odor, and frequency for two days.  PSA 6.7 one month ago, 8.8 two months ago. Birdie Sons, MD has advised patient to have rechecked PSA  First part of December 2021.  Hemoglobin A1C 5.5 last  Five months ago.  Creatinine 1.68 GFR 39 on 8/1/20021  Patient  denies any fever, body aches,chills, rash, chest pain, shortness of breath, nausea, vomiting, or diarrhea.  Denies dizziness, lightheadedness, pre syncopal or syncopal episodes.   Patient Active Problem List   Diagnosis Date Noted  . Elevated PSA 12/25/2019  . Family history of diabetes mellitus 08/19/2019  . Hyperglycemia 08/19/2019  . Pulmonary embolism ([place]) 02/08/2018  . CAP (community acquired pneumonia) 02/08/2018  . Essential hypertension 03/27/2017  . Chronic systolic CHF (congestive heart failure), NYHA class 2 ([place]) 03/27/2017  . Hyperlipidemia, mixed 03/27/2017  . Coronary artery disease involving native coronary artery of native heart without angina pectoris 03/27/2017   Past Medical History:  Diagnosis Date  . Arthritis   . Bursitis   .  Dysrhythmia   . Heart murmur   . Hepatitis   . Stomach ulcer        Medications: Outpatient Medications Prior to Visit  Medication Sig  . apixaban (ELIQUIS) 5 MG TABS tablet One tablet twice daily (Patient taking differently: Take 5 mg by mouth daily. One tablet twice daily)  . atorvastatin (LIPITOR) 10 MG tablet Take 10 mg at bedtime by mouth.  . carvedilol (COREG) 12.5 MG tablet Take 12.5 mg by mouth 2 (two) times daily with a meal.  . enalapril (VASOTEC) 10 MG tablet Take 10 mg by mouth 2 (two) times daily.  . famotidine (PEPCID) 20 MG tablet TAKE 1 TABLET BY MOUTH TWICE A DAY  . furosemide (LASIX) 20 MG tablet Take 20 mg by mouth daily.  Marland Kitchen omega-3 acid ethyl esters (LOVAZA) 1 g capsule Take 1 capsule (1 g total) by mouth 2 (two) times daily.  . methocarbamol (ROBAXIN) 500 MG tablet Take 1 tablet (500 mg total) by mouth 4 (four) times daily. (Patient not taking: Reported on 12/24/2019)  . sucralfate (CARAFATE) 1 g tablet Take 1 tablet (1 g total) by mouth 4 (four) times daily for 7 days.   No facility-administered medications prior to visit.    Review of Systems  Constitutional: Negative.  Negative for activity change, appetite change, chills, diaphoresis, fatigue, fever and unexpected weight change.  Respiratory: Negative.   Cardiovascular: Negative.   Gastrointestinal: Negative.  Negative for nausea and vomiting.  Genitourinary: Positive for frequency. Negative for decreased urine volume, difficulty urinating, discharge, dysuria, enuresis, flank pain, genital sores, hematuria, hesitancy, penile  pain, penile swelling, scrotal swelling, testicular pain and urgency.  Psychiatric/Behavioral: Negative.     Last CBC Lab Results  Component Value Date   WBC 9.6 12/21/2019   HGB 13.6 12/21/2019   HCT 38.9 (L) 12/21/2019   MCV 89.4 12/21/2019   MCH 31.3 12/21/2019   RDW 12.7 12/21/2019   PLT 192 24/26/8341   Last metabolic panel Lab Results  Component Value Date   GLUCOSE 116  (H) 12/21/2019   NA 140 12/21/2019   K 4.3 12/21/2019   CL 110 12/21/2019   CO2 20 (L) 12/21/2019   BUN 33 (H) 12/21/2019   CREATININE 1.68 (H) 12/21/2019   GFRNONAA 39 (L) 12/21/2019   GFRAA 45 (L) 12/21/2019   CALCIUM 9.0 12/21/2019   PROT 7.5 12/21/2019   ALBUMIN 4.0 12/21/2019   LABGLOB 2.8 10/07/2019   AGRATIO 1.4 10/07/2019   BILITOT 1.4 (H) 12/21/2019   ALKPHOS 73 12/21/2019   AST 21 12/21/2019   ALT 15 12/21/2019   ANIONGAP 10 12/21/2019      Objective    BP 106/68   Pulse 92   Temp 97.6 F (36.4 C) (Oral)   Resp 16   Wt 199 lb 3.2 oz (90.4 kg)   SpO2 100%   BMI 25.58 kg/m     Physical Exam Constitutional:      Appearance: Normal appearance. He is not ill-appearing, toxic-appearing or diaphoretic.  HENT:     Head: Normocephalic and atraumatic.     Right Ear: External ear normal.     Left Ear: External ear normal.     Mouth/Throat:     Pharynx: Oropharynx is clear.  Eyes:     Conjunctiva/sclera: Conjunctivae normal.  Cardiovascular:     Rate and Rhythm: Normal rate and regular rhythm.     Pulses: Normal pulses.     Heart sounds: Normal heart sounds. No murmur heard.  No friction rub. No gallop.   Pulmonary:     Effort: Pulmonary effort is normal. No respiratory distress.     Breath sounds: Normal breath sounds. No stridor. No wheezing, rhonchi or rales.  Chest:     Chest wall: No tenderness.  Abdominal:     Palpations: Abdomen is soft.     Tenderness: There is no abdominal tenderness.  Musculoskeletal:        General: Normal range of motion.     Cervical back: Normal range of motion and neck supple.  Skin:    General: Skin is warm and dry.     Capillary Refill: Capillary refill takes less than 2 seconds.     Findings: No erythema or rash.  Neurological:     Mental Status: He is alert and oriented to person, place, and time.  Psychiatric:        Mood and Affect: Mood normal.        Behavior: Behavior normal.        Thought Content: Thought  content normal.        Judgment: Judgment normal.     Results for orders placed or performed in visit on 03/10/20 (from the past 24 hour(s))  POCT urinalysis dipstick     Status: Abnormal   Collection Time: 03/10/20  3:35 PM  Result Value Ref Range   Color, UA yellow    Clarity, UA clear    Glucose, UA Negative Negative   Bilirubin, UA negative    Ketones, UA negative    Spec Grav, UA 1.010 1.010 - 1.025   Blood,  UA non hemolyzed trace    pH, UA 5.0 5.0 - 8.0   Protein, UA Positive (A) Negative   Urobilinogen, UA 1.0 0.2 or 1.0 E.U./dL   Nitrite, UA negative    Leukocytes, UA Small (1+) (A) Negative   Appearance     Odor      Results for orders placed or performed in visit on 03/10/20  POCT urinalysis dipstick  Result Value Ref Range   Color, UA yellow    Clarity, UA clear    Glucose, UA Negative Negative   Bilirubin, UA negative    Ketones, UA negative    Spec Grav, UA 1.010 1.010 - 1.025   Blood, UA non hemolyzed trace    pH, UA 5.0 5.0 - 8.0   Protein, UA Positive (A) Negative   Urobilinogen, UA 1.0 0.2 or 1.0 E.U./dL   Nitrite, UA negative    Leukocytes, UA Small (1+) (A) Negative   Appearance     Odor      Assessment & Plan     Urinary frequency - Plan: POCT urinalysis dipstick, Urinalysis, Routine w reflex microscopic, Urine Culture, CBC with Differential/Platelet, Comprehensive Metabolic Panel (CMET)  Leukocytes in urine - Plan: Urinalysis, Routine w reflex microscopic, Urine Culture  Elevated PSA - Plan: PSA    Meds ordered this encounter  Medications  . cephALEXin (KEFLEX) 500 MG capsule    Sig: Take 1 capsule (500 mg total) by mouth 2 (two) times daily.    Dispense:  14 capsule    Refill:  0   Sent for culture.   Will recheck PSA out of caution given history.   Red Flags discussed. The patient was given clear instructions to go to ER or return to medical center if any red flags develop, symptoms do not improve, worsen or new problems develop.  They verbalized understanding.  An After Visit Summary was printed and given to the patient.  Strict return precautions discussed.  Return if symptoms worsen or fail to improve, for at any time for any worsening symptoms, Go to Emergency room/ urgent care if worse.     Has follow up with Dr. Caryn Section 04/28/2020    Marcille Buffy, [place] [phone] (phone) [phone] (fax)  [place]

## 2020-03-10 NOTE — Telephone Encounter (Signed)
Pt. Reports he started having urinary frequency, dry mouth, hand tremors yesterday. Concerned "I might have diabetes." No other symptoms. Appointment made for today.  Reason for Disposition . Urinating more frequently than usual (i.e., frequency)  Answer Assessment - Initial Assessment Questions 1. SYMPTOM: "What's the main symptom you're concerned about?" (e.g., frequency, incontinence)     Frequency,dry mouth, hand tremors 2. ONSET: "When did the  symptoms  start?"     Yesterday 3. PAIN: "Is there any pain?" If Yes, ask: "How bad is it?" (Scale: 1-10; mild, moderate, severe)     No 4. CAUSE: "What do you think is causing the symptoms?"     Unsure 5. OTHER SYMPTOMS: "Do you have any other symptoms?" (e.g., fever, flank pain, blood in urine, pain with urination)     No 6. PREGNANCY: "Is there any chance you are pregnant?" "When was your last menstrual period?"     n/a  Protocols used: URINARY [facility]

## 2020-03-11 ENCOUNTER — Other Ambulatory Visit: Payer: Self-pay | Admitting: Adult Health

## 2020-03-11 ENCOUNTER — Telehealth: Payer: Self-pay

## 2020-03-11 DIAGNOSIS — R350 Frequency of micturition: Secondary | ICD-10-CM

## 2020-03-11 DIAGNOSIS — D72829 Elevated white blood cell count, unspecified: Secondary | ICD-10-CM | POA: Insufficient documentation

## 2020-03-11 DIAGNOSIS — R972 Elevated prostate specific antigen [PSA]: Secondary | ICD-10-CM

## 2020-03-11 DIAGNOSIS — R82998 Other abnormal findings in urine: Secondary | ICD-10-CM

## 2020-03-11 LAB — URINALYSIS, ROUTINE W REFLEX MICROSCOPIC
Bilirubin, UA: NEGATIVE
Glucose, UA: NEGATIVE
Ketones, UA: NEGATIVE
Nitrite, UA: NEGATIVE
RBC, UA: NEGATIVE
Specific Gravity, UA: 1.021 (ref 1.005–1.030)
Urobilinogen, Ur: 1 mg/dL (ref 0.2–1.0)
pH, UA: 7.5 (ref 5.0–7.5)

## 2020-03-11 LAB — CBC WITH DIFFERENTIAL/PLATELET
Basophils Absolute: 0.1 10*3/uL (ref 0.0–0.2)
Basos: 0 %
EOS (ABSOLUTE): 0.2 10*3/uL (ref 0.0–0.4)
Eos: 1 %
Hematocrit: 35.8 % — ABNORMAL LOW (ref 37.5–51.0)
Hemoglobin: 11.8 g/dL — ABNORMAL LOW (ref 13.0–17.7)
Immature Grans (Abs): 0.1 10*3/uL (ref 0.0–0.1)
Immature Granulocytes: 1 %
Lymphocytes Absolute: 1.7 10*3/uL (ref 0.7–3.1)
Lymphs: 11 %
MCH: 30.6 pg (ref 26.6–33.0)
MCHC: 33 g/dL (ref 31.5–35.7)
MCV: 93 fL (ref 79–97)
Monocytes Absolute: 1.2 10*3/uL — ABNORMAL HIGH (ref 0.1–0.9)
Monocytes: 8 %
Neutrophils Absolute: 11.6 10*3/uL — ABNORMAL HIGH (ref 1.4–7.0)
Neutrophils: 79 %
Platelets: 181 10*3/uL (ref 150–450)
RBC: 3.85 x10E6/uL — ABNORMAL LOW (ref 4.14–5.80)
RDW: 11.8 % (ref 11.6–15.4)
WBC: 14.7 10*3/uL — ABNORMAL HIGH (ref 3.4–10.8)

## 2020-03-11 LAB — MICROSCOPIC EXAMINATION: Casts: NONE SEEN /lpf

## 2020-03-11 LAB — COMPREHENSIVE METABOLIC PANEL
ALT: 16 IU/L (ref 0–44)
AST: 21 IU/L (ref 0–40)
Albumin/Globulin Ratio: 1.3 (ref 1.2–2.2)
Albumin: 3.8 g/dL (ref 3.7–4.7)
Alkaline Phosphatase: 92 IU/L (ref 44–121)
BUN/Creatinine Ratio: 14 (ref 10–24)
BUN: 21 mg/dL (ref 8–27)
Bilirubin Total: 1 mg/dL (ref 0.0–1.2)
CO2: 22 mmol/L (ref 20–29)
Calcium: 8.8 mg/dL (ref 8.6–10.2)
Chloride: 105 mmol/L (ref 96–106)
Creatinine, Ser: 1.5 mg/dL — ABNORMAL HIGH (ref 0.76–1.27)
GFR calc Af Amer: 52 mL/min/{1.73_m2} — ABNORMAL LOW (ref >59)
GFR calc non Af Amer: 45 mL/min/{1.73_m2} — ABNORMAL LOW (ref >59)
Globulin, Total: 2.9 g/dL (ref 1.5–4.5)
Glucose: 123 mg/dL — ABNORMAL HIGH (ref 65–99)
Potassium: 3.9 mmol/L (ref 3.5–5.2)
Sodium: 142 mmol/L (ref 134–144)
Total Protein: 6.7 g/dL (ref 6.0–8.5)

## 2020-03-11 LAB — PSA: Prostate Specific Ag, Serum: 70.6 ng/mL — ABNORMAL HIGH (ref 0.0–4.0)

## 2020-03-11 NOTE — Telephone Encounter (Signed)
Reviewed over assessment and plan with patient and advised to continue antibiotic as prescribed, if symptoms worsen to report to nearest ED for evaluation due to elevated WBC and PSA levels.  Patient states that urology appt is 03/18/20. KW

## 2020-03-11 NOTE — Progress Notes (Unsigned)
Orders Placed This Encounter  Procedures  . Ambulatory referral to Urology    Referral Priority:   Urgent    Referral Type:   Consultation    Referral Reason:   Specialty Services Required    Referred to Provider:   Hollice Espy, MD    Requested Specialty:   Urology    Number of Visits Requested:   1  ' . Recent Results (from the past 2160 hour(s))  Basic metabolic panel     Status: Abnormal   Collection Time: 12/21/19 12:57 PM  Result Value Ref Range   Sodium 140 135 - 145 mmol/L   Potassium 4.3 3.5 - 5.1 mmol/L   Chloride 110 98 - 111 mmol/L   CO2 20 (L) 22 - 32 mmol/L   Glucose, Bld 116 (H) 70 - 99 mg/dL    Comment: Glucose reference range applies only to samples taken after fasting for at least 8 hours.   BUN 33 (H) 8 - 23 mg/dL   Creatinine, Ser 1.68 (H) 0.61 - 1.24 mg/dL   Calcium 9.0 8.9 - 10.3 mg/dL   GFR calc non Af Amer 39 (L) >60 mL/min   GFR calc Af Amer 45 (L) >60 mL/min   Anion gap 10 5 - 15    Comment: Performed at [facility], [address] [place], [place] [address]  CBC     Status: Abnormal   Collection Time: 12/21/19 12:57 PM  Result Value Ref Range   WBC 9.6 4.0 - 10.5 K/uL   RBC 4.35 4.22 - 5.81 MIL/uL   Hemoglobin 13.6 13.0 - 17.0 g/dL   HCT 38.9 (L) 39 - 52 %   MCV 89.4 80.0 - 100.0 fL   MCH 31.3 26.0 - 34.0 pg   MCHC 35.0 30.0 - 36.0 g/dL   RDW 12.7 11.5 - 15.5 %   Platelets 192 150 - 400 K/uL   nRBC 0.0 0.0 - 0.2 %    Comment: Performed at [facility], [address] [place], [place] [address]  Troponin I (High Sensitivity)     Status: Abnormal   Collection Time: 12/21/19 12:57 PM  Result Value Ref Range   Troponin I (High Sensitivity) 19 (H) <18 ng/L    Comment: (NOTE) Elevated high sensitivity troponin I (hsTnI) values and significant  changes across serial measurements may suggest ACS but many other  chronic and acute conditions are known to elevate hsTnI results.  Refer to the "Links" section for chest pain  algorithms and additional  guidance. Performed at [facility], [address] [place], [place] [address]   Magnesium     Status: None   Collection Time: 12/21/19 12:57 PM  Result Value Ref Range   Magnesium 2.3 1.7 - 2.4 mg/dL    Comment: Performed at [facility], [place], [place] [address]  Troponin I (High Sensitivity)     Status: Abnormal   Collection Time: 12/21/19  3:05 PM  Result Value Ref Range   Troponin I (High Sensitivity) 20 (H) <18 ng/L    Comment: (NOTE) Elevated high sensitivity troponin I (hsTnI) values and significant  changes across serial measurements may suggest ACS but many other  chronic and acute conditions are known to elevate hsTnI results.  Refer to the "Links" section for chest pain algorithms and additional  guidance. Performed at [facility], [address] [place], [place] [address]   Lipase, blood     Status: None   Collection  Time: 12/21/19  4:18 PM  Result Value Ref Range   Lipase 28 11 - 51 U/L    Comment: Performed at [facility], [address] [place], [place] [address]  Hepatic function panel     Status: Abnormal   Collection Time: 12/21/19  4:18 PM  Result Value Ref Range   Total Protein 7.5 6.5 - 8.1 g/dL   Albumin 4.0 3.5 - 5.0 g/dL   AST 21 15 - 41 U/L   ALT 15 0 - 44 U/L   Alkaline Phosphatase 73 38 - 126 U/L   Total Bilirubin 1.4 (H) 0.3 - 1.2 mg/dL   Bilirubin, Direct 0.2 0.0 - 0.2 mg/dL   Indirect Bilirubin 1.2 (H) 0.3 - 0.9 mg/dL    Comment: Performed at [facility], [address] [place], [place] [address]  Hepatitis C antibody     Status: None   Collection Time: 12/24/19 10:10 AM  Result Value Ref Range   Hep C Virus Ab <0.1 0.0 - 0.9 s/co ratio    Comment:                                   Negative:     < 0.8                              Indeterminate: 0.8 - 0.9                                   Positive:     > 0.9  The CDC recommends that a  positive HCV antibody result  be followed up with a HCV Nucleic Acid Amplification  test (938101).   PSA     Status: Abnormal   Collection Time: 12/24/19 10:10 AM  Result Value Ref Range   Prostate Specific Ag, Serum 8.8 (H) 0.0 - 4.0 ng/mL    Comment: Roche ECLIA methodology. According to the American Urological Association, Serum PSA should decrease and remain at undetectable levels after radical prostatectomy. The AUA defines biochemical recurrence as an initial PSA value 0.2 ng/mL or greater followed by a subsequent confirmatory PSA value 0.2 ng/mL or greater. Values obtained with different assay methods or kits cannot be used interchangeably. Results cannot be interpreted as absolute evidence of the presence or absence of malignant disease.   Lipid panel     Status: Abnormal   Collection Time: 12/24/19 10:10 AM  Result Value Ref Range   Cholesterol, Total 110 100 - 199 mg/dL   Triglycerides 91 0 - 149 mg/dL   HDL 25 (L) >39 mg/dL   VLDL Cholesterol Cal 18 5 - 40 mg/dL   LDL Chol Calc (NIH) 67 0 - 99 mg/dL   Chol/HDL Ratio 4.4 0.0 - 5.0 ratio    Comment:                                   T. Chol/HDL Ratio                                             Men  Women  1/2 Avg.Risk  3.4    3.3                                   Avg.Risk  5.0    4.4                                2X Avg.Risk  9.6    7.1                                3X Avg.Risk 23.4   11.0   TSH     Status: Abnormal   Collection Time: 12/24/19 10:10 AM  Result Value Ref Range   TSH 5.610 (H) 0.450 - 4.500 uIU/mL  H. pylori breath test     Status: None   Collection Time: 12/24/19 10:28 AM  Result Value Ref Range   H pylori Breath Test Negative Negative  PSA Total (Reflex To Free)     Status: Abnormal   Collection Time: 01/29/20 10:33 AM  Result Value Ref Range   Prostate Specific Ag, Serum 6.7 (H) 0.0 - 4.0 ng/mL    Comment: Roche ECLIA methodology. According to the American  Urological Association, Serum PSA should decrease and remain at undetectable levels after radical prostatectomy. The AUA defines biochemical recurrence as an initial PSA value 0.2 ng/mL or greater followed by a subsequent confirmatory PSA value 0.2 ng/mL or greater. Values obtained with different assay methods or kits cannot be used interchangeably. Results cannot be interpreted as absolute evidence of the presence or absence of malignant disease.    Reflex Criteria Comment     Comment: The percent free PSA is performed on a reflex basis only when the total PSA is between 4.0 and 10.0 ng/mL.   %fPSA Reflex     Status: None   Collection Time: 01/29/20 10:33 AM  Result Value Ref Range   PSA, FREE 2.18 N/A ng/mL    Comment: Roche ECLIA methodology.   % FREE PSA 32.5 %    Comment: The table below lists the probability of prostate cancer for men with non-suspicious DRE results and total PSA between 4 and 10 ng/mL, by patient age Ricci Barker, [place], [id number]).                   % Free PSA       50-64 yr        65-75 yr                   0.00-10.00%        56%             55%                  10.01-15.00%        24%             35%                  15.01-20.00%        17%             23%                  20.01-25.00%        10%  20%                       >25.00%         5%              9% Please note:  Catalona et al did not make specific               recommendations regarding the use of               percent free PSA for any other population               of men.   Urinalysis, Routine w reflex microscopic     Status: Abnormal   Collection Time: 03/10/20 12:00 AM  Result Value Ref Range   Specific Gravity, UA 1.021 1.005 - 1.030   pH, UA 7.5 5.0 - 7.5   Color, UA Yellow Yellow   Appearance Ur Cloudy (A) Clear   Leukocytes,UA 1+ (A) Negative   Protein,UA 1+ (A) Negative/Trace   Glucose, UA Negative Negative   Ketones, UA Negative Negative   RBC, UA Negative  Negative   Bilirubin, UA Negative Negative   Urobilinogen, Ur 1.0 0.2 - 1.0 mg/dL   Nitrite, UA Negative Negative   Microscopic Examination See below:     Comment: Microscopic was indicated and was performed.  Microscopic Examination     Status: Abnormal   Collection Time: 03/10/20 12:00 AM   Urine  Result Value Ref Range   WBC, UA 11-30 (A) 0 - 5 /hpf   RBC 0-2 0 - 2 /hpf   Epithelial Cells (non renal) 0-10 0 - 10 /hpf   Casts None seen None seen /lpf   Crystals Present (A) N/A   Crystal Type Triple Phosphate N/A   Bacteria, UA Few None seen/Few  POCT urinalysis dipstick     Status: Abnormal   Collection Time: 03/10/20  3:35 PM  Result Value Ref Range   Color, UA yellow    Clarity, UA clear    Glucose, UA Negative Negative   Bilirubin, UA negative    Ketones, UA negative    Spec Grav, UA 1.010 1.010 - 1.025   Blood, UA non hemolyzed trace    pH, UA 5.0 5.0 - 8.0   Protein, UA Positive (A) Negative   Urobilinogen, UA 1.0 0.2 or 1.0 E.U./dL   Nitrite, UA negative    Leukocytes, UA Small (1+) (A) Negative   Appearance     Odor    PSA     Status: Abnormal   Collection Time: 03/10/20  3:51 PM  Result Value Ref Range   Prostate Specific Ag, Serum 70.6 (H) 0.0 - 4.0 ng/mL    Comment: Roche ECLIA methodology. According to the American Urological Association, Serum PSA should decrease and remain at undetectable levels after radical prostatectomy. The AUA defines biochemical recurrence as an initial PSA value 0.2 ng/mL or greater followed by a subsequent confirmatory PSA value 0.2 ng/mL or greater. Values obtained with different assay methods or kits cannot be used interchangeably. Results cannot be interpreted as absolute evidence of the presence or absence of malignant disease.   CBC with Differential/Platelet     Status: Abnormal   Collection Time: 03/10/20  3:51 PM  Result Value Ref Range   WBC 14.7 (H) 3.4 - 10.8 x10E3/uL   RBC 3.85 (L) 4.14 - 5.80 x10E6/uL    Hemoglobin 11.8 (L) 13.0 - 17.7 g/dL  Hematocrit 35.8 (L) 37.5 - 51.0 %   MCV 93 79 - 97 fL   MCH 30.6 26.6 - 33.0 pg   MCHC 33.0 31 - 35 g/dL   RDW 11.8 11.6 - 15.4 %   Platelets 181 150 - 450 x10E3/uL   Neutrophils 79 Not Estab. %   Lymphs 11 Not Estab. %   Monocytes 8 Not Estab. %   Eos 1 Not Estab. %   Basos 0 Not Estab. %   Neutrophils Absolute 11.6 (H) 1.40 - 7.00 x10E3/uL   Lymphocytes Absolute 1.7 0 - 3 x10E3/uL   Monocytes Absolute 1.2 (H) 0 - 0 x10E3/uL   EOS (ABSOLUTE) 0.2 0.0 - 0.4 x10E3/uL   Basophils Absolute 0.1 0 - 0 x10E3/uL   Immature Granulocytes 1 Not Estab. %   Immature Grans (Abs) 0.1 0.0 - 0.1 x10E3/uL  Comprehensive Metabolic Panel (CMET)     Status: Abnormal   Collection Time: 03/10/20  3:51 PM  Result Value Ref Range   Glucose 123 (H) 65 - 99 mg/dL   BUN 21 8 - 27 mg/dL   Creatinine, Ser 1.50 (H) 0.76 - 1.27 mg/dL   GFR calc non Af Amer 45 (L) >59 mL/min/1.73   GFR calc Af Amer 52 (L) >59 mL/min/1.73    Comment: **In accordance with recommendations from the NKF-ASN Task force,**   [facility] is in the process of updating its eGFR calculation to the   2021 CKD-EPI creatinine equation that estimates kidney function   without a race variable.    BUN/Creatinine Ratio 14 10 - 24   Sodium 142 134 - 144 mmol/L   Potassium 3.9 3.5 - 5.2 mmol/L   Chloride 105 96 - 106 mmol/L   CO2 22 20 - 29 mmol/L   Calcium 8.8 8.6 - 10.2 mg/dL   Total Protein 6.7 6.0 - 8.5 g/dL   Albumin 3.8 3.7 - 4.7 g/dL   Globulin, Total 2.9 1.5 - 4.5 g/dL   Albumin/Globulin Ratio 1.3 1.2 - 2.2   Bilirubin Total 1.0 0.0 - 1.2 mg/dL   Alkaline Phosphatase 92 44 - 121 IU/L    Comment:               **Please note reference interval change**   AST 21 0 - 40 IU/L   ALT 16 0 - 44 IU/L

## 2020-03-11 NOTE — Telephone Encounter (Signed)
Copied from [place] [id number]. Topic: General - Other >> Mar 11, 2020  3:17 PM Hinda Lenis D wrote: PT was seen yesterday by Sharyn Lull for a UTI /  he need a nurse to go over the medication an what to do next, he's very confuse  / please advise

## 2020-03-11 NOTE — Progress Notes (Signed)
Please let patient know a referral has been placed for evaluation by urology, his PSA is elevated significantly more and he needs evaluation by urologist. He should stay on the antibiotic prescribed. Urine culture pending. Return to office or seek after hours care if any emergent or red flag symptoms occur at anytime.   Schedule follow up in one week, will need to repeat CBC.  WBC count is elevated.

## 2020-03-14 ENCOUNTER — Other Ambulatory Visit: Payer: Self-pay | Admitting: Adult Health

## 2020-03-14 DIAGNOSIS — R972 Elevated prostate specific antigen [PSA]: Secondary | ICD-10-CM

## 2020-03-14 DIAGNOSIS — D72829 Elevated white blood cell count, unspecified: Secondary | ICD-10-CM

## 2020-03-14 LAB — URINE CULTURE

## 2020-03-14 NOTE — Progress Notes (Signed)
Please follow up call to patient, he does have UTI,  see if he is having any improvement ?  CBC needs repeat in lab in next 2 days.  Keep urology appointment advised as previously discussed.

## 2020-03-15 ENCOUNTER — Emergency Department
Admission: EM | Admit: 2020-03-15 | Discharge: 2020-03-15 | Disposition: A | Discharge status: Home / Self Care | Payer: PPO | Attending: Emergency Medicine | Admitting: Emergency Medicine

## 2020-03-15 ENCOUNTER — Encounter: Payer: Self-pay | Admitting: Emergency Medicine

## 2020-03-15 ENCOUNTER — Other Ambulatory Visit: Payer: Self-pay

## 2020-03-15 DIAGNOSIS — I110 Hypertensive heart disease with heart failure: Secondary | ICD-10-CM | POA: Insufficient documentation

## 2020-03-15 DIAGNOSIS — R300 Dysuria: Secondary | ICD-10-CM | POA: Insufficient documentation

## 2020-03-15 DIAGNOSIS — Z7902 Long term (current) use of antithrombotics/antiplatelets: Secondary | ICD-10-CM | POA: Diagnosis not present

## 2020-03-15 DIAGNOSIS — R3915 Urgency of urination: Secondary | ICD-10-CM

## 2020-03-15 DIAGNOSIS — Z79899 Other long term (current) drug therapy: Secondary | ICD-10-CM | POA: Insufficient documentation

## 2020-03-15 DIAGNOSIS — I2510 Atherosclerotic heart disease of native coronary artery without angina pectoris: Secondary | ICD-10-CM | POA: Diagnosis not present

## 2020-03-15 DIAGNOSIS — N401 Enlarged prostate with lower urinary tract symptoms: Secondary | ICD-10-CM | POA: Diagnosis not present

## 2020-03-15 DIAGNOSIS — I5031 Acute diastolic (congestive) heart failure: Secondary | ICD-10-CM | POA: Diagnosis not present

## 2020-03-15 DIAGNOSIS — Z7901 Long term (current) use of anticoagulants: Secondary | ICD-10-CM | POA: Diagnosis not present

## 2020-03-15 DIAGNOSIS — R350 Frequency of micturition: Secondary | ICD-10-CM | POA: Diagnosis present

## 2020-03-15 LAB — URINALYSIS, COMPLETE (UACMP) WITH MICROSCOPIC
Bacteria, UA: NONE SEEN
Bilirubin Urine: NEGATIVE
Glucose, UA: NEGATIVE mg/dL
Hgb urine dipstick: NEGATIVE
Ketones, ur: NEGATIVE mg/dL
Leukocytes,Ua: NEGATIVE
Nitrite: NEGATIVE
Protein, ur: NEGATIVE mg/dL
Specific Gravity, Urine: 1.011 (ref 1.005–1.030)
pH: 5 (ref 5.0–8.0)

## 2020-03-15 LAB — BASIC METABOLIC PANEL
Anion gap: 8 (ref 5–15)
BUN: 24 mg/dL — ABNORMAL HIGH (ref 8–23)
CO2: 24 mmol/L (ref 22–32)
Calcium: 8.9 mg/dL (ref 8.9–10.3)
Chloride: 105 mmol/L (ref 98–111)
Creatinine, Ser: 1.53 mg/dL — ABNORMAL HIGH (ref 0.61–1.24)
GFR, Estimated: 47 mL/min — ABNORMAL LOW (ref >60)
Glucose, Bld: 121 mg/dL — ABNORMAL HIGH (ref 70–99)
Potassium: 4.2 mmol/L (ref 3.5–5.1)
Sodium: 137 mmol/L (ref 135–145)

## 2020-03-15 LAB — CBC
HCT: 35.3 % — ABNORMAL LOW (ref 39.0–52.0)
Hemoglobin: 11.9 g/dL — ABNORMAL LOW (ref 13.0–17.0)
MCH: 30.3 pg (ref 26.0–34.0)
MCHC: 33.7 g/dL (ref 30.0–36.0)
MCV: 89.8 fL (ref 80.0–100.0)
Platelets: 291 10*3/uL (ref 150–400)
RBC: 3.93 MIL/uL — ABNORMAL LOW (ref 4.22–5.81)
RDW: 12.2 % (ref 11.5–15.5)
WBC: 10.8 10*3/uL — ABNORMAL HIGH (ref 4.0–10.5)
nRBC: 0 % (ref 0.0–0.2)

## 2020-03-15 MED ORDER — TAMSULOSIN HCL 0.4 MG PO CAPS: 0.4000 mg | ORAL_CAPSULE | Freq: Every day | ORAL | 2 refills | Status: AC

## 2020-03-15 NOTE — Telephone Encounter (Signed)
Kat,  There should be another message to follow up with patient regarding follow up CBC and to see how he is doing , he has urology appointment 03/18/20  Can you call urology and see if they can see him any sooner .

## 2020-03-15 NOTE — Telephone Encounter (Signed)
Pt called back saying he is going to the bathroom every hour but is not able to urinate very much.  He is uncomfortable and wants to know what he can do to help.  CB#  [phone]

## 2020-03-15 NOTE — ED Triage Notes (Signed)
Pt sent by doc for "prostate eval", he states. Says he went to his PCP, and he was sent here for prostate swelling, pain and urinary frequency. States he was started on Keflex 10/20 for UTI. Reports cont'd cloudy urine and odor today, denies any hematuria or fevers.

## 2020-03-15 NOTE — Discharge Instructions (Addendum)
Follow-up with your regular doctor as needed.  Follow-up with Dr. Erlene Quan for your already scheduled appointment. Take over-the-counter saw palmetto to help decrease the size of your prostate, Flomax to aid in urination.  Continue all of your regular medications and finish the Keflex prescription. Return to the emergency department if worsening

## 2020-03-15 NOTE — Telephone Encounter (Signed)
Patient reports that symptoms are not worse but reports frequency or urination every two hours, pain at the base of his penis, decreased appetite and difficulty emptying bladder completley. Patient complains of sharp abdominal pain. Patient denies symptoms of fever, nausea, vomiting, sweats, disoriented, diarrhea, constipation, hematuria. I will contact [facility] to see if I can get appt in for tomorrow. Contacted Michelle Flinchum and advised her of patients response and she advises that patient go to ED today for evaluation given his symptoms, elevated PSA and elevated WBC. Contacted patient and advised him to go to ED for evaluation and treatment, patient understood and states that he will go today. KW

## 2020-03-15 NOTE — ED Provider Notes (Signed)
[facility] Emergency Department Provider Note  ____________________________________________   None    (approximate)  I have reviewed the triage vital signs and the nursing notes.   HISTORY  Chief Complaint Prostate Check and Urinary Frequency    HPI Darius Parker is a 74 y.o. male presents emergency department complaining of problems with urination.  States it stings when he starts to urinate.  States then he will have to go back in 2 hours to empty his bladder again.  Patient has been urinating today.  No fever or chills.  Has been taking his antibiotic but does not think it is helping.  No abdominal pain.  Has an appointment with urology in 1 week    Past Medical History:  Diagnosis Date  . Arthritis   . Bursitis   . Dysrhythmia   . Heart murmur   . Hepatitis   . Stomach ulcer     Patient Active Problem List   Diagnosis Date Noted  . Leukocytosis 03/11/2020  . Leukocytes in urine 03/10/2020  . Urinary frequency 03/10/2020  . Elevated PSA 12/25/2019  . Family history of diabetes mellitus 08/19/2019  . Hyperglycemia 08/19/2019  . Pulmonary embolism ([place]) 02/08/2018  . CAP (community acquired pneumonia) 02/08/2018  . Essential hypertension 03/27/2017  . Chronic systolic CHF (congestive heart failure), NYHA class 2 ([place]) 03/27/2017  . Hyperlipidemia, mixed 03/27/2017  . Coronary artery disease involving native coronary artery of native heart without angina pectoris 03/27/2017    Past Surgical History:  Procedure Laterality Date  . BACK SURGERY    . CARDIAC CATHETERIZATION    . CATARACT EXTRACTION W/PHACO Right 03/01/2016   Procedure: CATARACT EXTRACTION PHACO AND INTRAOCULAR LENS PLACEMENT ([place]);  Surgeon: Estill Cotta, MD;  Location: [facility] ORS;  Service: Ophthalmology;  Laterality: Right;  [place] 1.33 AP% 21.6 CDE 37.45 Fluid Pack Lot # [id number]  . COLONOSCOPY W/ POLYPECTOMY    . TONSILLECTOMY      Prior to Admission medications     Medication Sig Start Date End Date Taking? Authorizing Provider  apixaban (ELIQUIS) 5 MG TABS tablet One tablet twice daily Patient taking differently: Take 5 mg by mouth daily. One tablet twice daily 05/06/18   Carmon Ginsberg, PA  atorvastatin (LIPITOR) 10 MG tablet Take 10 mg at bedtime by mouth. 01/21/17   [provider]  carvedilol (COREG) 12.5 MG tablet Take 12.5 mg by mouth 2 (two) times daily with a meal.    [provider]  cephALEXin (KEFLEX) 500 MG capsule Take 1 capsule (500 mg total) by mouth 2 (two) times daily. 03/10/20   Flinchum, Kelby Aline, FNP  enalapril (VASOTEC) 10 MG tablet Take 10 mg by mouth 2 (two) times daily.    [provider]  famotidine (PEPCID) 20 MG tablet TAKE 1 TABLET BY MOUTH TWICE A DAY 02/15/20   Birdie Sons, MD  furosemide (LASIX) 20 MG tablet Take 20 mg by mouth daily.    [provider]  methocarbamol (ROBAXIN) 500 MG tablet Take 1 tablet (500 mg total) by mouth 4 (four) times daily. Patient not taking: Reported on 12/24/2019 04/29/19   Chrismon, Vickki Muff, PA  omega-3 acid ethyl esters (LOVAZA) 1 g capsule Take 1 capsule (1 g total) by mouth 2 (two) times daily. 04/12/18   Birdie Sons, MD  sucralfate (CARAFATE) 1 g tablet Take 1 tablet (1 g total) by mouth 4 (four) times daily for 7 days. 12/21/19 12/28/19  Nance Pear, MD  tamsulosin (  FLOMAX) 0.4 MG CAPS capsule Take 1 capsule (0.4 mg total) by mouth daily. 03/15/20   Versie Starks, PA-C    Allergies Tetanus toxoids  Family History  Problem Relation Age of Onset  . Diabetes Brother     Social History Social History   Tobacco Use  . Smoking status: Never Smoker  . Smokeless tobacco: Never Used  Vaping Use  . Vaping Use: Never used  Substance Use Topics  . Alcohol use: No  . Drug use: Never    Review of Systems  Constitutional: No fever/chills Eyes: No visual changes. ENT: No sore throat. Respiratory: Denies cough Cardiovascular: Denies  chest pain Gastrointestinal: Denies abdominal pain Genitourinary: Positive for dysuria and urinary hesitancy Musculoskeletal: Negative for back pain. Skin: Negative for rash. Psychiatric: no mood changes,     ____________________________________________   PHYSICAL EXAM:  VITAL SIGNS: ED Triage Vitals  Enc Vitals Group     BP 03/15/20 1443 122/68     Pulse Rate 03/15/20 1443 84     Resp 03/15/20 1443 18     Temp 03/15/20 1443 98.6 F (37 C)     Temp Source 03/15/20 1443 Oral     SpO2 03/15/20 1443 100 %     Weight 03/15/20 1445 180 lb (81.6 kg)     Height --      Head Circumference --      Peak Flow --      Pain Score 03/15/20 1445 6     Pain Loc --      Pain Edu? --      Excl. in [place]? --     Constitutional: Alert and oriented. Well appearing and in no acute distress. Eyes: Conjunctivae are normal.  Head: Atraumatic. Nose: No congestion/rhinnorhea. Mouth/Throat: Mucous membranes are moist.   Neck:  supple no lymphadenopathy noted Cardiovascular: Normal rate, regular rhythm.  Respiratory: Normal respiratory effort.  No retractions, l Abd: soft nontender bs normal all 4 quad Rectal exam: Prostate is grossly enlarged, tender to palpation, unable to assess if any lumps due to the size of the prostate GU: deferred Musculoskeletal: FROM all extremities, warm and well perfused Neurologic:  Normal speech and language.  Skin:  Skin is warm, dry and intact. No rash noted. Psychiatric: Mood and affect are normal. Speech and behavior are normal.  ____________________________________________   LABS (all labs ordered are listed, but only abnormal results are displayed)  Labs Reviewed  URINALYSIS, COMPLETE (UACMP) WITH MICROSCOPIC - Abnormal; Notable for the following components:      Result Value   Color, Urine YELLOW (*)    APPearance CLEAR (*)    All other components within normal limits  BASIC METABOLIC PANEL - Abnormal; Notable for the following components:    Glucose, Bld 121 (*)    BUN 24 (*)    Creatinine, Ser 1.53 (*)    GFR, Estimated 47 (*)    All other components within normal limits  CBC - Abnormal; Notable for the following components:   WBC 10.8 (*)    RBC 3.93 (*)    Hemoglobin 11.9 (*)    HCT 35.3 (*)    All other components within normal limits  URINE CULTURE   ____________________________________________   ____________________________________________  RADIOLOGY    ____________________________________________   PROCEDURES  Procedure(s) performed: No  Procedures    ____________________________________________   INITIAL IMPRESSION / ASSESSMENT AND PLAN / ED COURSE  Pertinent labs & imaging results that were available during my care of the patient  were reviewed by me and considered in my medical decision making (see chart for details).   Patient 74 year old male presents emergency department with dysuria.  See HPI.  Physical exam shows patient appears stable.  Rectal exam shows no large tender prostate.  DDx: Sepsis, UTI, prostatitis, BPH  I do feel that the patient should follow-up with the urologist.  He should keep this appointment which is already been established by his PCP.  Continue the Keflex as his UA today does not show any white blood cells or concerns for infection.   CBC has minimal elevation of WBC at 10.8 therefore do not feel the patient is septic, basic metabolic panel is stable, and urinalysis is normal.  I did explain all findings to the patient.  He was given a prescription for Flomax and instructed to take saw palmetto to decrease size of the prostate.  He is to keep his appointment with urology.  Return emergency department for worsening.  Urine culture was also added to his urinalysis to assess if there is any resistance to the bacteria.  Darius Parker was evaluated in Emergency Department on 03/15/2020 for the symptoms described in the history of present illness. He was evaluated in the  context of the global COVID-19 pandemic, which necessitated consideration that the patient might be at risk for infection with the SARS-CoV-2 virus that causes COVID-19. Institutional protocols and algorithms that pertain to the evaluation of patients at risk for COVID-19 are in a state of rapid change based on information released by regulatory bodies including the CDC and federal and state organizations. These policies and algorithms were followed during the patient's care in the ED.    As part of my medical decision making, I reviewed the following data within the [place] notes reviewed and incorporated, Labs reviewed , Old chart reviewed, Notes from prior ED visits and [place] Controlled Substance Database  ____________________________________________   FINAL CLINICAL IMPRESSION(S) / ED DIAGNOSES  Final diagnoses:  Benign prostatic hyperplasia (BPH) with urinary urgency  Dysuria      NEW MEDICATIONS STARTED DURING THIS VISIT:  New Prescriptions   TAMSULOSIN (FLOMAX) 0.4 MG CAPS CAPSULE    Take 1 capsule (0.4 mg total) by mouth daily.     Note:  This document was prepared using Dragon voice recognition software and may include unintentional dictation errors.    Versie Starks, PA-C 03/15/20 1629    Arta Silence, MD 03/16/20 [phone]

## 2020-03-15 NOTE — ED Notes (Signed)
See triage note. Pt ambulatory to room. Pt stating he is having prostate swelling and pain. Pt denies difficulty urinating. Pt in NAD

## 2020-03-15 NOTE — Telephone Encounter (Signed)
Agree. thanks

## 2020-03-18 ENCOUNTER — Encounter: Payer: Self-pay | Admitting: Urology

## 2020-03-18 ENCOUNTER — Ambulatory Visit (INDEPENDENT_AMBULATORY_CARE_PROVIDER_SITE_OTHER): Payer: PPO | Admitting: Urology

## 2020-03-18 ENCOUNTER — Other Ambulatory Visit: Payer: Self-pay

## 2020-03-18 VITALS — BP 97/63 | HR 89 | Ht 74.0 in | Wt 185.0 lb

## 2020-03-18 DIAGNOSIS — R350 Frequency of micturition: Secondary | ICD-10-CM

## 2020-03-18 DIAGNOSIS — R972 Elevated prostate specific antigen [PSA]: Secondary | ICD-10-CM | POA: Diagnosis not present

## 2020-03-18 DIAGNOSIS — R82998 Other abnormal findings in urine: Secondary | ICD-10-CM

## 2020-03-18 DIAGNOSIS — N410 Acute prostatitis: Secondary | ICD-10-CM

## 2020-03-18 LAB — URINE CULTURE: Culture: >=100000 — AB

## 2020-03-18 LAB — BLADDER SCAN AMB NON-IMAGING

## 2020-03-18 MED ORDER — CIPROFLOXACIN HCL 500 MG PO TABS
500.0000 mg | ORAL_TABLET | Freq: Once | ORAL | Status: AC
  Administered 2020-03-18: 500 mg via ORAL

## 2020-03-18 MED ORDER — CIPROFLOXACIN HCL 500 MG PO TABS: 500.0000 mg | ORAL_TABLET | Freq: Two times a day (BID) | ORAL | 0 refills | Status: AC

## 2020-03-18 NOTE — Patient Instructions (Signed)

## 2020-03-18 NOTE — Progress Notes (Signed)
03/18/20 10:36 AM   Darius Parker 1945-06-17 [id number]  CC: Elevated PSA, dysuria, UTI  HPI: I saw Mr. Innis in urology clinic for the above issues.  He is a 74 year old male with no family history of prostate cancer, and a personal history of PE who remains on anticoagulation with Eliquis.  He reports about 2 weeks of worsening urinary symptoms with weak stream, dysuria, difficulty urinating.  He was seen by his PCP on 10/20 and urine culture ultimately grew > 100 K Enterococcus.  He was started on Keflex at that visit for possible UTI.  He had worsening of his urinary symptoms and ultimately presented to the ED on 10/25 where urine culture grew > 100 K Pseudomonas.  He was continued on Keflex by the ED, and started on Flomax.  DRE showed a large tender prostate per report.  He denies any family history of prostate or breast cancer.  He deferred DRE today in setting of recent DRE in the ED.  PMH: Past Medical History:  Diagnosis Date  . Arthritis   . Bursitis   . Dysrhythmia   . Heart murmur   . Hepatitis   . Stomach ulcer     Surgical History: Past Surgical History:  Procedure Laterality Date  . BACK SURGERY    . CARDIAC CATHETERIZATION    . CATARACT EXTRACTION W/PHACO Right 03/01/2016   Procedure: CATARACT EXTRACTION PHACO AND INTRAOCULAR LENS PLACEMENT ([place]);  Surgeon: Estill Cotta, MD;  Location: [facility] ORS;  Service: Ophthalmology;  Laterality: Right;  [place] 1.33 AP% 21.6 CDE 37.45 Fluid Pack Lot # [id number]  . COLONOSCOPY W/ POLYPECTOMY    . TONSILLECTOMY      Family History: Family History  Problem Relation Age of Onset  . Diabetes Brother     Social History:  reports that he has never smoked. He has never used smokeless tobacco. He reports that he does not drink alcohol and does not use drugs.  Physical Exam: BP 97/63 (BP Location: Left Arm, Patient Position: Sitting, Cuff Size: Normal)   Pulse 89   Ht 6\' 2"  (1.88 m)   Wt 185 lb (83.9 kg)   BMI 23.75  kg/m    Constitutional:  Alert and oriented, No acute distress. Cardiovascular: No clubbing, cyanosis, or edema. Respiratory: Normal respiratory effort, no increased work of breathing. GI: Abdomen is soft, nontender, nondistended, no abdominal masses GU: Uncircumcised phallus with patent meatus, no lesions DRE: Deferred  Laboratory Data: Reviewed, see HPI  Pertinent Imaging: None to review  Assessment & Plan:   In summary, is a 74 year old male with a history of PE who remains on Eliquis who developed what sounds like prostatitis about 2 weeks ago with two positive urine cultures for both Enterococcus and Pseudomonas.  He has been on Keflex, which unfortunately does not cover either of these bacteria.  This explains his acute and significant increase in PSA to 70.6.  We also reviewed the AUA guidelines regarding PSA screening that do not recommend routine screening in men over age 62.  I recommended a 4-week course of culture appropriate Cipro for his acute prostatitis, continuing Flomax for at least 1 month, and we discussed return precautions at length including fever/chills or worsening urinary symptoms that could represent prostate abscess or sepsis.  RTC 6 weeks for symptom check Consider repeat PSA in 3 to 4 months to confirm normalization after acute prostatitis    Nickolas Madrid, MD 03/18/2020  [facility] [address] [place],  [place] [address] [phone]

## 2020-03-19 LAB — URINALYSIS, COMPLETE
Bilirubin, UA: NEGATIVE
Glucose, UA: NEGATIVE
Ketones, UA: NEGATIVE
Nitrite, UA: NEGATIVE
Protein,UA: NEGATIVE
Specific Gravity, UA: 1.025 (ref 1.005–1.030)
Urobilinogen, Ur: 0.2 mg/dL (ref 0.2–1.0)
pH, UA: 5.5 (ref 5.0–7.5)

## 2020-03-19 LAB — MICROSCOPIC EXAMINATION: Bacteria, UA: NONE SEEN

## 2020-03-22 LAB — CULTURE, URINE COMPREHENSIVE

## 2020-04-28 ENCOUNTER — Ambulatory Visit (INDEPENDENT_AMBULATORY_CARE_PROVIDER_SITE_OTHER): Payer: PPO | Admitting: Family Medicine

## 2020-04-28 ENCOUNTER — Other Ambulatory Visit: Payer: Self-pay

## 2020-04-28 ENCOUNTER — Encounter: Payer: Self-pay | Admitting: Family Medicine

## 2020-04-28 VITALS — BP 116/72 | HR 80 | Temp 97.6°F | Resp 16 | Wt 201.0 lb

## 2020-04-28 DIAGNOSIS — B351 Tinea unguium: Secondary | ICD-10-CM

## 2020-04-28 MED ORDER — TERBINAFINE HCL 250 MG PO TABS: 500.0000 mg | ORAL_TABLET | Freq: Every day | ORAL | 5 refills | Status: DC

## 2020-04-28 NOTE — Progress Notes (Signed)
Established patient visit   Patient: Darius Parker   DOB: Dec 03, 1945   74 y.o. Male  MRN: [id number] Visit Date: 04/28/2020  Today's healthcare provider: Lelon Huh, MD   Chief Complaint  Patient presents with  . Hypertension  . Nail Problem   Subjective    HPI  Hypertension, follow-up  BP Readings from Last 3 Encounters:  04/28/20 116/72  03/18/20 97/63  03/15/20 122/68   Wt Readings from Last 3 Encounters:  04/28/20 201 lb (91.2 kg)  03/18/20 185 lb (83.9 kg)  03/15/20 180 lb (81.6 kg)     He was last seen for hypertension 4 months ago.  BP at that visit was 106/58. Management since that visit includes continue same medication.  He reports good compliance with treatment. He is not having side effects.  He is following a Regular diet. He is not exercising. He does not smoke.  Use of agents associated with hypertension: none.   Outside blood pressures are not checked. Symptoms: No chest pain No chest pressure  No palpitations No syncope  No dyspnea No orthopnea  No paroxysmal nocturnal dyspnea No lower extremity edema   Pertinent labs: Lab Results  Component Value Date   CHOL 110 12/24/2019   HDL 25 (L) 12/24/2019   LDLCALC 67 12/24/2019   TRIG 91 12/24/2019   CHOLHDL 4.4 12/24/2019   Lab Results  Component Value Date   NA 137 03/15/2020   K 4.2 03/15/2020   CREATININE 1.53 (H) 03/15/2020   GFRNONAA 47 (L) 03/15/2020   GFRAA 52 (L) 03/10/2020   GLUCOSE 121 (H) 03/15/2020     The ASCVD Risk score Mikey Bussing DC Jr., et al., 2013) failed to calculate for the following reasons:   The valid total cholesterol range is 130 to 320 mg/dL   ---------------------------------------------------------------------------------------------------  Toe nail problem: Patient complains of abnormal thickness of the left great toe. This has been ongoing for several years. He has tried OTC toe nail fungus solutions with no relief.       Medications: Outpatient  Medications Prior to Visit  Medication Sig  . apixaban (ELIQUIS) 5 MG TABS tablet One tablet twice daily (Patient taking differently: Take 5 mg by mouth daily. One tablet twice daily)  . atorvastatin (LIPITOR) 10 MG tablet Take 10 mg at bedtime by mouth.  . carvedilol (COREG) 12.5 MG tablet Take 12.5 mg by mouth 2 (two) times daily with a meal.  . enalapril (VASOTEC) 10 MG tablet Take 10 mg by mouth 2 (two) times daily.  . famotidine (PEPCID) 20 MG tablet TAKE 1 TABLET BY MOUTH TWICE A DAY  . furosemide (LASIX) 20 MG tablet Take 20 mg by mouth daily.  Marland Kitchen omega-3 acid ethyl esters (LOVAZA) 1 g capsule Take 1 capsule (1 g total) by mouth 2 (two) times daily.  . tamsulosin (FLOMAX) 0.4 MG CAPS capsule Take 1 capsule (0.4 mg total) by mouth daily.  . [DISCONTINUED] sucralfate (CARAFATE) 1 g tablet Take 1 tablet (1 g total) by mouth 4 (four) times daily for 7 days.   No facility-administered medications prior to visit.    Review of Systems  Constitutional: Negative for appetite change, chills and fever.  Respiratory: Negative for chest tightness, shortness of breath and wheezing.   Cardiovascular: Negative for chest pain and palpitations.  Gastrointestinal: Negative for abdominal pain, nausea and vomiting.  Skin:       Left great toenail thickness and discoloration      Objective  BP 116/72 (BP Location: Left Arm, Patient Position: Sitting, Cuff Size: Normal)   Pulse 80   Temp 97.6 F (36.4 C) (Oral)   Resp 16   Wt 201 lb (91.2 kg)   BMI 25.81 kg/m    Physical Exam  General appearance:  Well developed, well nourished male, cooperative and in no acute distress Head: Normocephalic, without obvious abnormality, atraumatic Respiratory: Respirations even and unlabored, normal respiratory rate Extremities: All extremities are intact.  Skin: Nails of both feet thickened yellow and dystrophic.     Assessment & Plan     1. Onychomycosis Discussed risk/benefit of oral and topical  treatments. He prefers pulse dosing due to lower risk of liver abnormalities. Advised this regiments is somewhat less likely to be effective than continuous dosing.  - terbinafine (LAMISIL) 250 MG tablet; Take 2 tablets (500 mg total) by mouth daily. Take for 1 week out of every 4 weeks  Dispense: 14 tablet; Refill: 5        The entirety of the information documented in the History of Present Illness, Review of Systems and Physical Exam were personally obtained by me. Portions of this information were initially documented by the CMA and reviewed by me for thoroughness and accuracy.      Lelon Huh, MD  [facility] [phone] (phone) [phone] (fax)  [place]

## 2020-05-06 ENCOUNTER — Other Ambulatory Visit: Payer: Self-pay

## 2020-05-06 ENCOUNTER — Ambulatory Visit: Payer: PPO | Admitting: Urology

## 2020-05-06 ENCOUNTER — Encounter: Payer: Self-pay | Admitting: Urology

## 2020-05-06 VITALS — BP 92/62 | HR 86 | Ht 74.0 in | Wt 180.0 lb

## 2020-05-06 DIAGNOSIS — N410 Acute prostatitis: Secondary | ICD-10-CM | POA: Diagnosis not present

## 2020-05-06 LAB — BLADDER SCAN AMB NON-IMAGING: Scan Result: 0

## 2020-05-06 NOTE — Progress Notes (Signed)
   05/06/2020 10:32 AM   Darius Parker [birth year] [id number]  Reason for visit: Follow up acute prostatitis, elevated PSA  HPI: Darius Parker is a 74 year old male with no family history of prostate cancer and a history of pulmonary embolus who remains on anticoagulation with Eliquis and in October 2021 developed acute prostatitis with urine cultures positive for both Enterococcus and Pseudomonas.  He was ultimately treated with culture appropriate Cipro which significantly improved his urinary symptoms of dysuria, weak stream, pelvic pain, difficulty urinating.  PSA at the time of acute infection was 70.  DRE was benign in the ED with no masses, but appropriately tender in the setting of prostatitis.  Notably, he had a PSA of 6.7 in September 2021 prior to his episode of prostatitis with reassuring 32.5% free.  This would correlate to a less than 10% chance of prostate cancer.  Additionally, normal PSA for men in their 29s would be less than 6.5.  We reviewed the AUA guidelines that do not recommend routine PSA screening in men over age 20, as well as the risks and benefits.  He denies any urinary symptoms today and reports he is doing very well.  His IPSS score today is 1 with quality of life pleased.  We discussed return precautions at length including dysuria, weak stream, recurrent UTIs/prostatitis.  We also reviewed the option of repeating a PSA in 3 to 4 months to confirm downtrending after his acute elevation, but he would like to hold off which I think is very reasonable based on his recent PSA essentially normal for his age in September 2021 with very high percentage free, and his age and comorbidities.  Discontinue PSA screening Follow-up with urology as needed  [place], [place] [address], [place], [place] [address] [phone]

## 2020-05-06 NOTE — Patient Instructions (Signed)
Prostate Cancer Screening  Prostate cancer screening is a test that is done to check for the presence of prostate cancer in men. The prostate gland is a walnut-sized gland that is located below the bladder and in front of the rectum in males. The function of the prostate is to add fluid to semen during ejaculation. Prostate cancer is the second most common type of cancer in men. Who should have prostate cancer screening?  Screening recommendations vary based on age and other risk factors. Screening is recommended if:  You are older than age 55. If you are age 55-69, talk with your health care provider about your need for screening and how often screening should be done. Because most prostate cancers are slow growing and will not cause death, screening is generally reserved in this age group for men who have a 10-15-year life expectancy.  You are younger than age 55, and you have these risk factors: ? Being a black male or a male of African descent. ? Having a father, brother, or uncle who has been diagnosed with prostate cancer. The risk is higher if your family member's cancer occurred at an early age. Screening is not recommended if:  You are younger than age 40.  You are between the ages of 40 and 54 and you have no risk factors.  You are 70 years of age or older. At this age, the risks that screening can cause are greater than the benefits that it may provide. If you are at high risk for prostate cancer, your health care provider may recommend that you have screenings more often or that you start screening at a younger age. How is screening for prostate cancer done? The recommended prostate cancer screening test is a blood test called the prostate-specific antigen (PSA) test. PSA is a protein that is made in the prostate. As you age, your prostate naturally produces more PSA. Abnormally high PSA levels may be caused by:  Prostate cancer.  An enlarged prostate that is not caused by cancer  (benign prostatic hyperplasia, BPH). This condition is very common in older men.  A prostate gland infection (prostatitis). Depending on the PSA results, you may need more tests, such as:  A physical exam to check the size of your prostate gland.  Blood and imaging tests.  A procedure to remove tissue samples from your prostate gland for testing (biopsy). What are the benefits of prostate cancer screening?  Screening can help to identify cancer at an early stage, before symptoms start and when the cancer can be treated more easily.  There is a small chance that screening may lower your risk of dying from prostate cancer. The chance is small because prostate cancer is a slow-growing cancer, and most men with prostate cancer die from a different cause. What are the risks of prostate cancer screening? The main risk of prostate cancer screening is diagnosing and treating prostate cancer that would never have caused any symptoms or problems. This is called overdiagnosisand overtreatment. PSA screening cannot tell you if your PSA is high due to cancer or a different cause. A prostate biopsy is the only procedure to diagnose prostate cancer. Even the results of a biopsy may not tell you if your cancer needs to be treated. Slow-growing prostate cancer may not need any treatment other than monitoring, so diagnosing and treating it may cause unnecessary stress or other side effects. A prostate biopsy may also cause:  Infection or fever.  A false negative. This is   a result that shows that you do not have prostate cancer when you actually do have prostate cancer. Questions to ask your health care provider  When should I start prostate cancer screening?  What is my risk for prostate cancer?  How often do I need screening?  What type of screening tests do I need?  How do I get my test results?  What do my results mean?  Do I need treatment? Where to find more information  The American Cancer  Society: www.cancer.org  American Urological Association: www.auanet.org Contact a health care provider if:  You have difficulty urinating.  You have pain when you urinate or ejaculate.  You have blood in your urine or semen.  You have pain in your back or in the area of your prostate. Summary  Prostate cancer is a common type of cancer in men. The prostate gland is located below the bladder and in front of the rectum. This gland adds fluid to semen during ejaculation.  Prostate cancer screening may identify cancer at an early stage, when the cancer can be treated more easily.  The prostate-specific antigen (PSA) test is the recommended screening test for prostate cancer.  Discuss the risks and benefits of prostate cancer screening with your health care provider. If you are age 70 or older, the risks that screening can cause are greater than the benefits that it may provide. This information is not intended to replace advice given to you by your health care provider. Make sure you discuss any questions you have with your health care provider. Document Revised: 12/19/2018 Document Reviewed: 12/19/2018 Elsevier Patient Education  2020 [employer]vier Inc.  

## 2020-06-07 ENCOUNTER — Other Ambulatory Visit: Payer: Self-pay

## 2020-06-07 ENCOUNTER — Encounter: Payer: Self-pay | Admitting: Emergency Medicine

## 2020-06-07 ENCOUNTER — Emergency Department: Payer: PPO

## 2020-06-07 ENCOUNTER — Emergency Department
Admission: EM | Admit: 2020-06-07 | Discharge: 2020-06-07 | Disposition: A | Discharge status: Home / Self Care | Payer: PPO | Attending: Student in an Organized Health Care Education/Training Program | Admitting: Student in an Organized Health Care Education/Training Program

## 2020-06-07 DIAGNOSIS — Z79899 Other long term (current) drug therapy: Secondary | ICD-10-CM | POA: Insufficient documentation

## 2020-06-07 DIAGNOSIS — R0981 Nasal congestion: Secondary | ICD-10-CM

## 2020-06-07 DIAGNOSIS — U071 COVID-19: Secondary | ICD-10-CM | POA: Insufficient documentation

## 2020-06-07 DIAGNOSIS — R42 Dizziness and giddiness: Secondary | ICD-10-CM | POA: Diagnosis not present

## 2020-06-07 DIAGNOSIS — I5022 Chronic systolic (congestive) heart failure: Secondary | ICD-10-CM | POA: Diagnosis not present

## 2020-06-07 DIAGNOSIS — I10 Essential (primary) hypertension: Secondary | ICD-10-CM | POA: Diagnosis not present

## 2020-06-07 DIAGNOSIS — Z7901 Long term (current) use of anticoagulants: Secondary | ICD-10-CM | POA: Diagnosis not present

## 2020-06-07 DIAGNOSIS — I2510 Atherosclerotic heart disease of native coronary artery without angina pectoris: Secondary | ICD-10-CM | POA: Diagnosis not present

## 2020-06-07 DIAGNOSIS — I110 Hypertensive heart disease with heart failure: Secondary | ICD-10-CM | POA: Diagnosis not present

## 2020-06-07 LAB — BASIC METABOLIC PANEL
Anion gap: 9 (ref 5–15)
BUN: 25 mg/dL — ABNORMAL HIGH (ref 8–23)
CO2: 22 mmol/L (ref 22–32)
Calcium: 8.6 mg/dL — ABNORMAL LOW (ref 8.9–10.3)
Chloride: 109 mmol/L (ref 98–111)
Creatinine, Ser: 1.39 mg/dL — ABNORMAL HIGH (ref 0.61–1.24)
GFR, Estimated: 53 mL/min — ABNORMAL LOW (ref >60)
Glucose, Bld: 114 mg/dL — ABNORMAL HIGH (ref 70–99)
Potassium: 3.8 mmol/L (ref 3.5–5.1)
Sodium: 140 mmol/L (ref 135–145)

## 2020-06-07 LAB — CBC
HCT: 34.1 % — ABNORMAL LOW (ref 39.0–52.0)
Hemoglobin: 11.7 g/dL — ABNORMAL LOW (ref 13.0–17.0)
MCH: 30.2 pg (ref 26.0–34.0)
MCHC: 34.3 g/dL (ref 30.0–36.0)
MCV: 88.1 fL (ref 80.0–100.0)
Platelets: 136 10*3/uL — ABNORMAL LOW (ref 150–400)
RBC: 3.87 MIL/uL — ABNORMAL LOW (ref 4.22–5.81)
RDW: 13.2 % (ref 11.5–15.5)
WBC: 4.9 10*3/uL (ref 4.0–10.5)
nRBC: 0 % (ref 0.0–0.2)

## 2020-06-07 LAB — TROPONIN I (HIGH SENSITIVITY)
Troponin I (High Sensitivity): 24 ng/L — ABNORMAL HIGH (ref <18)
Troponin I (High Sensitivity): 26 ng/L — ABNORMAL HIGH (ref <18)

## 2020-06-07 LAB — SARS CORONAVIRUS 2 (TAT 6-24 HRS): SARS Coronavirus 2: POSITIVE — AB

## 2020-06-07 MED ORDER — SODIUM CHLORIDE 0.9 % IV BOLUS
500.0000 mL | Freq: Once | INTRAVENOUS | Status: AC
  Administered 2020-06-07: 500 mL via INTRAVENOUS

## 2020-06-07 NOTE — ED Notes (Signed)
Says dizziness with standing at home brought him in.  Says his heart rate is not normally in 90s.  Usually 70s.  Denies fever.  Just pain bilateral hips which is new.  He has had a runny nose since yesterday.

## 2020-06-07 NOTE — ED Triage Notes (Signed)
Pt reports some nasal congestions and some intermittent dizziness. Pt also reports his HR was in the 90's and it is usually lower.

## 2020-06-07 NOTE — ED Provider Notes (Signed)
[facility] Emergency Department Provider Note    Event Date/Time   First MD Initiated Contact with Patient 06/07/20 1146     (approximate)  I have reviewed the triage vital signs and the nursing notes.   HISTORY  Chief Complaint Nasal Congestion, Dizziness, and Generalized Body Aches    HPI Darius Parker is a 75 y.o. male below listed past medical history on Eliquis presents to the ER for evaluation of nasal congestion and generalized malaise as well as concerned that his heart rate was over 80.  He checks his heart rate on an apple watch.  States is typically around 62.  Denies any chest pain no shortness of breath right now.  Not currently on any antibiotics.    Past Medical History:  Diagnosis Date  . Arthritis   . Bursitis   . Dysrhythmia   . Heart murmur   . Hepatitis   . Stomach ulcer    Family History  Problem Relation Age of Onset  . Diabetes Brother    Past Surgical History:  Procedure Laterality Date  . BACK SURGERY    . CARDIAC CATHETERIZATION    . CATARACT EXTRACTION W/PHACO Right 03/01/2016   Procedure: CATARACT EXTRACTION PHACO AND INTRAOCULAR LENS PLACEMENT ([place]);  Surgeon: Estill Cotta, MD;  Location: [facility] ORS;  Service: Ophthalmology;  Laterality: Right;  [place] 1.33 AP% 21.6 CDE 37.45 Fluid Pack Lot # [id number]  . COLONOSCOPY W/ POLYPECTOMY    . TONSILLECTOMY     Patient Active Problem List   Diagnosis Date Noted  . Onychomycosis 04/28/2020  . Leukocytosis 03/11/2020  . Leukocytes in urine 03/10/2020  . Urinary frequency 03/10/2020  . Elevated PSA 12/25/2019  . Family history of diabetes mellitus 08/19/2019  . Hyperglycemia 08/19/2019  . Pulmonary embolism ([place]) 02/08/2018  . CAP (community acquired pneumonia) 02/08/2018  . Essential hypertension 03/27/2017  . Chronic systolic CHF (congestive heart failure), NYHA class 2 ([place]) 03/27/2017  . Hyperlipidemia, mixed 03/27/2017  . Coronary artery disease involving  native coronary artery of native heart without angina pectoris 03/27/2017      Prior to Admission medications   Medication Sig Start Date End Date Taking? Authorizing Provider  apixaban (ELIQUIS) 5 MG TABS tablet One tablet twice daily 05/06/18   Carmon Ginsberg, PA  atorvastatin (LIPITOR) 10 MG tablet Take 10 mg at bedtime by mouth. 01/21/17   [provider]  carvedilol (COREG) 12.5 MG tablet Take 12.5 mg by mouth 2 (two) times daily with a meal.    [provider]  enalapril (VASOTEC) 10 MG tablet Take 10 mg by mouth 2 (two) times daily.    [provider]  famotidine (PEPCID) 20 MG tablet TAKE 1 TABLET BY MOUTH TWICE A DAY 02/15/20   Birdie Sons, MD  furosemide (LASIX) 20 MG tablet Take 20 mg by mouth daily.    [provider]  omega-3 acid ethyl esters (LOVAZA) 1 g capsule Take 1 capsule (1 g total) by mouth 2 (two) times daily. 04/12/18   Birdie Sons, MD  tamsulosin (FLOMAX) 0.4 MG CAPS capsule Take 1 capsule (0.4 mg total) by mouth daily. 03/15/20   Fisher, Linden Dolin, PA-C  terbinafine (LAMISIL) 250 MG tablet Take 2 tablets (500 mg total) by mouth daily. Take for 1 week out of every 4 weeks 04/28/20   Birdie Sons, MD    Allergies Tetanus toxoids    Social History Social History   Tobacco Use  . Smoking status:  Never Smoker  . Smokeless tobacco: Never Used  Vaping Use  . Vaping Use: Never used  Substance Use Topics  . Alcohol use: No  . Drug use: Never    Review of Systems Patient denies headaches, rhinorrhea, blurry vision, numbness, shortness of breath, chest pain, edema, cough, abdominal pain, nausea, vomiting, diarrhea, dysuria, fevers, rashes or hallucinations unless otherwise stated above in HPI. ____________________________________________   PHYSICAL EXAM:  VITAL SIGNS: Vitals:   06/07/20 0812 06/07/20 1153  BP: (!) 157/140 107/68  Pulse: 99 90  Resp: 20 16  Temp:  98.4 F (36.9 C)  SpO2: 97% 98%     Constitutional: Alert and oriented.  Eyes: Conjunctivae are normal.  Head: Atraumatic. Nose: No congestion/rhinnorhea. Mouth/Throat: Mucous membranes are moist.   Neck: No stridor. Painless ROM.  Cardiovascular: Normal rate, regular rhythm. Grossly normal heart sounds.  Good peripheral circulation. Respiratory: Normal respiratory effort.  No retractions. Lungs CTAB. Gastrointestinal: Soft and nontender. No distention. No abdominal bruits. No CVA tenderness. Genitourinary:  Musculoskeletal: No lower extremity tenderness nor edema.  No joint effusions. Neurologic:  Normal speech and language. No gross focal neurologic deficits are appreciated. No facial droop Skin:  Skin is warm, dry and intact. No rash noted. Psychiatric: Mood and affect are normal. Speech and behavior are normal.  ____________________________________________   LABS (all labs ordered are listed, but only abnormal results are displayed)  Results for orders placed or performed during the hospital encounter of 06/07/20 (from the past 24 hour(s))  Basic metabolic panel     Status: Abnormal   Collection Time: 06/07/20  8:34 AM  Result Value Ref Range   Sodium 140 135 - 145 mmol/L   Potassium 3.8 3.5 - 5.1 mmol/L   Chloride 109 98 - 111 mmol/L   CO2 22 22 - 32 mmol/L   Glucose, Bld 114 (H) 70 - 99 mg/dL   BUN 25 (H) 8 - 23 mg/dL   Creatinine, Ser 1.39 (H) 0.61 - 1.24 mg/dL   Calcium 8.6 (L) 8.9 - 10.3 mg/dL   GFR, Estimated 53 (L) >60 mL/min   Anion gap 9 5 - 15  CBC     Status: Abnormal   Collection Time: 06/07/20  8:34 AM  Result Value Ref Range   WBC 4.9 4.0 - 10.5 K/uL   RBC 3.87 (L) 4.22 - 5.81 MIL/uL   Hemoglobin 11.7 (L) 13.0 - 17.0 g/dL   HCT 34.1 (L) 39.0 - 52.0 %   MCV 88.1 80.0 - 100.0 fL   MCH 30.2 26.0 - 34.0 pg   MCHC 34.3 30.0 - 36.0 g/dL   RDW 13.2 11.5 - 15.5 %   Platelets 136 (L) 150 - 400 K/uL   nRBC 0.0 0.0 - 0.2 %  Troponin I (High Sensitivity)     Status: Abnormal   Collection  Time: 06/07/20  8:34 AM  Result Value Ref Range   Troponin I (High Sensitivity) 24 (H) <18 ng/L  Troponin I (High Sensitivity)     Status: Abnormal   Collection Time: 06/07/20 12:00 PM  Result Value Ref Range   Troponin I (High Sensitivity) 26 (H) <18 ng/L   ____________________________________________  EKG My review and personal interpretation at Time: 8:16   Indication: dizziness  Rate: 95  Rhythm: sinus Axis: left Other: lvh criteria, ivcd, no stemi ____________________________________________  RADIOLOGY  I personally reviewed all radiographic images ordered to evaluate for the above acute complaints and reviewed radiology reports and findings.  These findings were personally discussed  with the patient.  Please see medical record for radiology report.  ____________________________________________   PROCEDURES  Procedure(s) performed:  Procedures    Critical Care performed: no ____________________________________________   INITIAL IMPRESSION / ASSESSMENT AND PLAN / ED COURSE  Pertinent labs & imaging results that were available during my care of the patient were reviewed by me and considered in my medical decision making (see chart for details).   DDX: uri, covid, pna, chf, ptx, anemia, dehydration  Darius Parker is a 75 y.o. who presents to the ED with presentation as described above.  Patient nontoxic-appearing hemodynamically stable no hypoxia.  EKG is slightly changed from previous the patient is denying any pain.  Troponins in triage is mildly elevated.  Will order serial enzymes of a lower suspicion for ACS troponin more likely secondary to known CHF.  Will check for COVID his symptoms would be consistent with URI and viral illness.  Clinical Course as of 06/07/20 1515  Mon Jun 07, 2020  1259 Serial enzymes with nonischemic profile and consistent with previous troponins.  Have lower suspicion for ACS.  This likely secondary to CHF. [PR]  1950 Chest x-ray without  any evidence of CHF or infiltrates.  He remains well-appearing nontoxic and hemodynamically stable.  Does appear appropriate for outpatient follow-up.  Have discussed with the patient and available family all diagnostics and treatments performed thus far and all questions were answered to the best of my ability. The patient demonstrates understanding and agreement with plan.  [PR]    Clinical Course User Index [PR] Merlyn Lot, MD    The patient was evaluated in Emergency Department today for the symptoms described in the history of present illness. He/she was evaluated in the context of the global COVID-19 pandemic, which necessitated consideration that the patient might be at risk for infection with the SARS-CoV-2 virus that causes COVID-19. Institutional protocols and algorithms that pertain to the evaluation of patients at risk for COVID-19 are in a state of rapid change based on information released by regulatory bodies including the CDC and federal and state organizations. These policies and algorithms were followed during the patient's care in the ED.  As part of my medical decision making, I reviewed the following data within the [facility] notes reviewed and incorporated, Labs reviewed, notes from prior ED visits and  Controlled Substance Database   ____________________________________________   FINAL CLINICAL IMPRESSION(S) / ED DIAGNOSES  Final diagnoses:  Dizziness  Nasal congestion      NEW MEDICATIONS STARTED DURING THIS VISIT:  Discharge Medication List as of 06/07/2020  1:32 PM       Note:  This document was prepared using Dragon voice recognition software and may include unintentional dictation errors.    Merlyn Lot, MD 06/07/20 1515

## 2020-06-07 NOTE — ED Notes (Signed)
Patient verbalizes understanding of discharge instructions. Opportunity for questioning and answers were provided. Armband removed by staff, pt discharged from ED. Ambulated out to lobby  

## 2020-06-08 ENCOUNTER — Telehealth: Payer: Self-pay

## 2020-06-08 ENCOUNTER — Telehealth: Payer: Self-pay | Admitting: Unknown Physician Specialty

## 2020-06-08 NOTE — Telephone Encounter (Signed)
Patient notified of positive COVID-19 test results. Pt verbalized understanding. Pt reports symptoms of muscle aches, runny nose Criteria for self-isolation if you test positive for COVID-19, regardless of vaccination status:  -If you have mild symptoms that are resolving or have resolved, isolate at home for 5 days since symptoms started AND continue to wear a well-fitted mask when around others in the home and in public for 5 additional days after isolation is completed -If you have a fever and/or moderate to severe symptoms, isolate for at least 10 days since the symptoms started AND until you are fever free for at least 24 hours without the use of fever-reducing medications -If you tested positive and did not have symptoms, isolate for at least 5 days after your positive test  Use over-the-counter medications for symptoms.If you develop respiratory issues/distress, seek medical care in the Emergency Department.  If you must leave home or if you have to be around others please wear a mask. Please limit contact with immediate family members in the home, practice social distancing, frequent handwashing and clean hard surfaces touched frequently with household cleaning products. Members of your household will also need to quarantine and test.You may also be contacted by the health department for follow up. [facility] Department notified.

## 2020-06-08 NOTE — Telephone Encounter (Signed)
Called to discuss with patient about COVID-19 symptoms and the use of one of the available treatments for those with mild to moderate Covid symptoms and at a high risk of hospitalization.  Pt appears to qualify for outpatient treatment due to co-morbid conditions and/or a member of an at-risk group in accordance with the FDA Emergency Use Authorization.    Patient not willing to discuss treatment options.    Kathrine Haddock

## 2020-06-11 ENCOUNTER — Ambulatory Visit: Payer: Self-pay | Admitting: *Deleted

## 2020-06-11 NOTE — Telephone Encounter (Signed)
I returned pt's call.   He wanted to know if he should be on any medicine for his positive covid diagnoses.    I let him know there are no specific medicines for covid symptoms except for over the counter medications.   He thought he was supposed to be on antibiotics.   I explained that antibiotics are not effective against viruses and that the covid 19 is a virus.    Some people are put on antibiotics if they have an underlying infection like pneumonia, sinus infection, etc. But in general they are not prescribed for covid.  He thanked me for my help and answering his questions.  (I noticed in his chart he was contacted as a candidate for monoclonal antibody infusion but he did not want to discuss treatment options per note).    Reason for Disposition . Caller has medicine question, adult has minor symptoms, caller declines triage, AND triager answers question    Seen in ED   Covid positive  Answer Assessment - Initial Assessment Questions 1. NAME of MEDICATION: "What medicine are you calling about?"     He is positive for covid.  No taste.   2. QUESTION: "What is your question?" (e.g., medication refill, side effect)     He was dx with covid with positive test in the ED.   He wanted to know should he be on any kind of medicines for covid?   Antibiotics or something? 3. PRESCRIBING HCP: "Who prescribed it?" Reason: if prescribed by specialist, call should be referred to that group.     Seen in ED 4. SYMPTOMS: "Do you have any symptoms?"     No taste, cough and runny nose 5. SEVERITY: If symptoms are present, ask "Are they mild, moderate or severe?"     No taste is weird.   When I eat potato chips I only taste the salt.  6. PREGNANCY:  "Is there any chance that you are pregnant?" "When was your last menstrual period?"     N/A  Protocols used: MEDICATION QUESTION CALL-A-AH

## 2020-08-25 ENCOUNTER — Ambulatory Visit: Payer: Self-pay | Admitting: *Deleted

## 2020-08-25 NOTE — Telephone Encounter (Signed)
I returned pt's call.   He has been having diarrhea, watery brown, for 3 days now.  His wife has also been sick with diarrhea but "I think she is better now".  No sick exposures they are aware of.  He is c/o being very weak, having to hold onto things to walk around the house "so I don't fall because I'm weak", a dry mouth and urinating less than usual when  I asked him about dehydration.   He is only drinking a glass or two of water a day but is having diarrhea several times a day.   Denies seeing any blood in it.   Having abd cramps even when not needing to go to the bathroom.   "When I have to go I have to go right away".  He denies taking anything for the diarrhea.   Has a poor appetite.   "I'm trying to eat light but I don't have an appetite".  I have referred him to the ED per the protocol due to the signs of dehydration and weakness.  He has not taken anything for the diarrhea.   He did not want to go to the ED.   He asked if he could go to the urgent care instead.   I let him know he really needed to go to the ED so he could receive IV fluids if the doctor in the ED determined he needed fluids.   "I'll be there waiting for hours".   "Can't I go to the urgent care".   He was agreeable to going to the urgent care so I advised him to go on there.    I let him know if the doctor at the urgent care determined he needed to go to the ED he could go from the urgent care.  Pt was agreeable and is going to the urgent care by the [facility].  I have sent my notes to [facility] for Dr. Caryn Parker.     Reason for Disposition . [1] Drinking very little AND [2] dehydration suspected (e.g., no urine > 12 hours, very dry mouth, very lightheaded)    Holding onto things to walk around.  Answer Assessment - Initial Assessment Questions 1. DIARRHEA SEVERITY: "How bad is the diarrhea?" "How many extra stools have you had in the past 24 hours than normal?"    - NO DIARRHEA (SCALE 0)   - MILD  (SCALE 1-3): Few loose or mushy BMs; increase of 1-3 stools over normal daily number of stools; mild increase in ostomy output.   -  MODERATE (SCALE 4-7): Increase of 4-6 stools daily over normal; moderate increase in ostomy output. * SEVERE (SCALE 8-10; OR 'WORST POSSIBLE'): Increase of 7 or more stools daily over normal; moderate increase in ostomy output; incontinence.     Darius Parker, wife and I are both having diarrhea.   I've had it 2 days and she has had it 3 days.   Dr. Hanley Parker wife Darius Parker some pills for diarrhea.   Loperamide. 2. ONSET: "When did the diarrhea begin?"      Darius Parker is still having diarrhea but has not taken anything.    3. BM CONSISTENCY: "How loose or watery is the diarrhea?"      It's just water. 4. VOMITING: "Are you also vomiting?" If Yes, ask: "How many times in the past 24 hours?"      No vomiting 5. ABDOMINAL PAIN: "Are you having any abdominal pain?" If Yes, ask: "What  does it feel like?" (e.g., crampy, dull, intermittent, constant)      Cramps all the time in my abd. 6. ABDOMINAL PAIN SEVERITY: If present, ask: "How bad is the pain?"  (e.g., Scale 1-10; mild, moderate, or severe)   - MILD (1-3): doesn't interfere with normal activities, abdomen soft and not tender to touch    - MODERATE (4-7): interferes with normal activities or awakens from sleep, tender to touch    - SEVERE (8-10): excruciating pain, doubled over, unable to do any normal activities       When I need to go I need to go quick. 7. ORAL INTAKE: If vomiting, "Have you been able to drink liquids?" "How much fluids have you had in the past 24 hours?"     No vomiting.   I eat cold cereal in mornings.   I'm drinking a glass or 2 a day.   8. HYDRATION: "Any signs of dehydration?" (e.g., dry mouth [not just dry lips], too weak to stand, dizziness, new weight loss) "When did you last urinate?"     My mouth dry.   I'm weak and having to hold onto things to walk around because I'm weak. 9. EXPOSURE: "Have you  traveled to a foreign country recently?" "Have you been exposed to anyone with diarrhea?" "Could you have eaten any food that was spoiled?"     No 10. ANTIBIOTIC USE: "Are you taking antibiotics now or have you taken antibiotics in the past 2 months?"       No antibiotics 11. OTHER SYMPTOMS: "Do you have any other symptoms?" (e.g., fever, blood in stool)  I'm weak and have to hold onto things at times when walking around the house.       No blood.   It's brown water.   No body aches.   Less urination than my normal.    12. PREGNANCY: "Is there any chance you are pregnant?" "When was your last menstrual period?"       N/A  Protocols used: DIARRHEA-A-AH

## 2020-08-30 DIAGNOSIS — E782 Mixed hyperlipidemia: Secondary | ICD-10-CM | POA: Diagnosis not present

## 2020-08-30 DIAGNOSIS — I2510 Atherosclerotic heart disease of native coronary artery without angina pectoris: Secondary | ICD-10-CM | POA: Diagnosis not present

## 2020-08-30 DIAGNOSIS — I10 Essential (primary) hypertension: Secondary | ICD-10-CM | POA: Diagnosis not present

## 2020-08-30 DIAGNOSIS — I5022 Chronic systolic (congestive) heart failure: Secondary | ICD-10-CM | POA: Diagnosis not present

## 2020-12-29 ENCOUNTER — Ambulatory Visit: Payer: PPO

## 2020-12-31 ENCOUNTER — Other Ambulatory Visit: Payer: Self-pay

## 2020-12-31 ENCOUNTER — Ambulatory Visit
Admission: RE | Admit: 2020-12-31 | Discharge: 2020-12-31 | Disposition: A | Discharge status: Home / Self Care | Payer: PPO | Source: Ambulatory Visit | Attending: Family Medicine | Admitting: Family Medicine

## 2020-12-31 ENCOUNTER — Other Ambulatory Visit: Payer: Self-pay | Admitting: Family Medicine

## 2020-12-31 ENCOUNTER — Ambulatory Visit
Admission: RE | Admit: 2020-12-31 | Discharge: 2020-12-31 | Disposition: A | Discharge status: Home / Self Care | Payer: PPO | Attending: Family Medicine | Admitting: Family Medicine

## 2020-12-31 ENCOUNTER — Encounter: Payer: Self-pay | Admitting: Family Medicine

## 2020-12-31 ENCOUNTER — Ambulatory Visit (INDEPENDENT_AMBULATORY_CARE_PROVIDER_SITE_OTHER): Payer: PPO | Admitting: Family Medicine

## 2020-12-31 VITALS — BP 102/59 | HR 79 | Temp 97.7°F | Resp 18 | Ht 74.0 in | Wt 199.0 lb

## 2020-12-31 DIAGNOSIS — R739 Hyperglycemia, unspecified: Secondary | ICD-10-CM

## 2020-12-31 DIAGNOSIS — Z0000 Encounter for general adult medical examination without abnormal findings: Secondary | ICD-10-CM

## 2020-12-31 DIAGNOSIS — I10 Essential (primary) hypertension: Secondary | ICD-10-CM

## 2020-12-31 DIAGNOSIS — M25562 Pain in left knee: Secondary | ICD-10-CM

## 2020-12-31 DIAGNOSIS — R972 Elevated prostate specific antigen [PSA]: Secondary | ICD-10-CM

## 2020-12-31 DIAGNOSIS — G8929 Other chronic pain: Secondary | ICD-10-CM

## 2020-12-31 NOTE — Progress Notes (Signed)
Annual Wellness Visit     Patient: Darius Parker, Male    DOB: July 05, 1945, 75 y.o.   MRN: [id number] Visit Date: 12/31/2020  Today's Provider: Lelon Huh, MD    Subjective    Darius Parker is a 75 y.o. male who presents today for his Annual Wellness Visit.     Medications: Outpatient Medications Prior to Visit  Medication Sig   aspirin EC 81 MG tablet Take 162 mg by mouth daily. Swallow whole.   atorvastatin (LIPITOR) 10 MG tablet Take 10 mg at bedtime by mouth.   carvedilol (COREG) 12.5 MG tablet Take 12.5 mg by mouth 2 (two) times daily with a meal.   enalapril (VASOTEC) 10 MG tablet Take 10 mg by mouth 2 (two) times daily.   famotidine (PEPCID) 20 MG tablet TAKE 1 TABLET BY MOUTH TWICE A DAY   furosemide (LASIX) 20 MG tablet Take 20 mg by mouth daily.   omega-3 acid ethyl esters (LOVAZA) 1 g capsule Take 1 capsule (1 g total) by mouth 2 (two) times daily.   tamsulosin (FLOMAX) 0.4 MG CAPS capsule Take 1 capsule (0.4 mg total) by mouth daily.   terbinafine (LAMISIL) 250 MG tablet Take 2 tablets (500 mg total) by mouth daily. Take for 1 week out of every 4 weeks   [DISCONTINUED] apixaban (ELIQUIS) 5 MG TABS tablet One tablet twice daily (Patient not taking: Reported on 12/31/2020)   No facility-administered medications prior to visit.    Allergies  Allergen Reactions   Tetanus Toxoids Swelling    Patient Care Team: Birdie Sons, MD as PCP - General (Family Medicine) Corey Skains, MD as Consulting Physician (Cardiology) [place], [place] (Optometry)        Objective     Most recent functional status assessment: In your present state of health, do you have any difficulty performing the following activities: 12/31/2020  Hearing? N  Vision? Y  Difficulty concentrating or making decisions? N  Walking or climbing stairs? Y  Dressing or bathing? N  Doing errands, shopping? N  Some recent data might be hidden   Most recent fall risk  assessment: Fall Risk  12/31/2020  Falls in the past year? 1  Number falls in past yr: 1  Injury with Fall? 1  Risk for fall due to : History of fall(s)  Follow up Falls prevention discussed    Most recent depression screenings: PHQ 2/9 Scores 12/31/2020 12/24/2019  PHQ - 2 Score 1 2  PHQ- 9 Score 1 6   Most recent cognitive screening: No flowsheet data found. Most recent Audit-C alcohol use screening Alcohol Use Disorder Test (AUDIT) 12/31/2020  1. How often do you have a drink containing alcohol? 0  2. How many drinks containing alcohol do you have on a typical day when you are drinking? 0  3. How often do you have six or more drinks on one occasion? 0  AUDIT-C Score 0  Alcohol Brief Interventions/Follow-up -   A score of 3 or more in women, and 4 or more in men indicates increased risk for alcohol abuse, EXCEPT if all of the points are from question 1   No results found for any visits on 12/31/20.  Assessment & Plan     Annual wellness visit done today including the all of the following: Reviewed patient's Family Medical History Reviewed and updated list of patient's medical providers Assessment of cognitive impairment was done Assessed patient's functional ability Established a written  schedule for health screening services Health Risk Assessent Completed and Reviewed  Exercise Activities and Dietary recommendations  Goals      DIET - DECREASE SODA OR JUICE INTAKE     Recommend to cut back on soda intake to no more than 1 a day and increase water intake.         Immunization History  Administered Date(s) Administered   PFIZER(Purple Top)SARS-COV-2 Vaccination 07/24/2019, 08/19/2019   Pneumococcal Polysaccharide-23 12/24/2019    Health Maintenance  Topic Date Due   TETANUS/TDAP  Never done   Zoster Vaccines- Shingrix (1 of 2) Never done   COVID-19 Vaccine (3 - Pfizer risk series) 09/16/2019   INFLUENZA VACCINE  12/20/2020   PNA vac Low Risk Adult (2 of 2 -  PCV13) 12/23/2020   Hepatitis C Screening  Completed   HPV VACCINES  Aged Out     Discussed health benefits of physical activity, and encouraged him to engage in regular exercise appropriate for his age and condition.            Lelon Huh, MD  [facility] [phone] (phone) [phone] (fax)  [place]

## 2020-12-31 NOTE — Progress Notes (Signed)
Complete Physical Exam      Patient: Darius Parker, Male    DOB: 10-Jan-1946, 75 y.o.   MRN: [id number] Visit Date: 12/31/2020  Today's Provider: Lelon Huh, MD    Subjective    Darius Parker is a 75 y.o. male who presents today for his complete physical examination  He reports consuming a general diet. The patient does not participate in regular exercise at present. He generally feels fairly well. He reports sleeping poorly (trouble staying asleep). He does have additional problems to discuss today (left knee pain x 2 months)  HPI Hypertension, follow-up  BP Readings from Last 3 Encounters:  12/31/20 (!) 102/59  06/07/20 107/68  05/06/20 92/62   Wt Readings from Last 3 Encounters:  12/31/20 199 lb (90.3 kg)  06/07/20 175 lb (79.4 kg)  05/06/20 180 lb (81.6 kg)     He was last seen for hypertension 1  year  ago.  BP at that visit was 106/58. Management since that visit includes continue same medication.  He reports good compliance with treatment. He is not having side effects.  He is following a Regular diet. He is not exercising. He does not smoke.  Use of agents associated with hypertension: none.   Outside blood pressures are not checked. Symptoms: No chest pain No chest pressure  No palpitations No syncope  No dyspnea No orthopnea  No paroxysmal nocturnal dyspnea Yes lower extremity edema   Pertinent labs: Lab Results  Component Value Date   CHOL 110 12/24/2019   HDL 25 (L) 12/24/2019   LDLCALC 67 12/24/2019   TRIG 91 12/24/2019   CHOLHDL 4.4 12/24/2019   Lab Results  Component Value Date   NA 140 06/07/2020   K 3.8 06/07/2020   CREATININE 1.39 (H) 06/07/2020   GFRNONAA 53 (L) 06/07/2020   GFRAA 52 (L) 03/10/2020   GLUCOSE 114 (H) 06/07/2020     The ASCVD Risk score (Goff DC Jr., et al., 2013) failed to calculate for the following reasons:   The valid total cholesterol range is 130 to 320 mg/dL    ---------------------------------------------------------------------------------------------------   Hyperglycemia, Follow-up  Lab Results  Component Value Date   HGBA1C 5.5 10/07/2019   GLUCOSE 114 (H) 06/07/2020   GLUCOSE 121 (H) 03/15/2020   GLUCOSE 123 (H) 03/10/2020    Last seen for for this1  year  ago.  Management since that visit includes continuing lifestyle modifications. Current symptoms include none and have been stable.  Prior visit with dietician: no Current diet: well balanced Current exercise: none   -----------------------------------------------------------------------------------------  Follow up for CHF:  The patient was last seen for this 1  year  ago. Changes made at last visit include none; continue follow up with Dr. Nehemiah Massed.  He reports good compliance with treatment. He feels that condition is Unchanged. He is not having side effects.   -----------------------------------------------------------------------------------------  His primary complaint today is worsening pain in his left knee for getting worse over the last few years, and making it difficult to operate the clutch on his tractor.     Medications: Outpatient Medications Prior to Visit  Medication Sig   aspirin EC 81 MG tablet Take 162 mg by mouth daily. Swallow whole.   atorvastatin (LIPITOR) 10 MG tablet Take 10 mg at bedtime by mouth.   carvedilol (COREG) 12.5 MG tablet Take 12.5 mg by mouth 2 (two) times daily with a meal.   enalapril (VASOTEC) 10 MG tablet Take 10 mg by mouth  2 (two) times daily.   famotidine (PEPCID) 20 MG tablet TAKE 1 TABLET BY MOUTH TWICE A DAY   furosemide (LASIX) 20 MG tablet Take 20 mg by mouth daily.   omega-3 acid ethyl esters (LOVAZA) 1 g capsule Take 1 capsule (1 g total) by mouth 2 (two) times daily.   tamsulosin (FLOMAX) 0.4 MG CAPS capsule Take 1 capsule (0.4 mg total) by mouth daily.   terbinafine (LAMISIL) 250 MG tablet Take 2 tablets (500 mg  total) by mouth daily. Take for 1 week out of every 4 weeks   [DISCONTINUED] apixaban (ELIQUIS) 5 MG TABS tablet One tablet twice daily (Patient not taking: Reported on 12/31/2020)   No facility-administered medications prior to visit.    Allergies  Allergen Reactions   Tetanus Toxoids Swelling    Patient Care Team: Birdie Sons, MD as PCP - General (Family Medicine) Corey Skains, MD as Consulting Physician (Cardiology) [facility], [facility], [place])  Review of Systems  Constitutional:  Negative for appetite change, chills, fatigue and fever.  HENT:  Positive for rhinorrhea and trouble swallowing. Negative for congestion, ear pain, hearing loss and nosebleeds.   Eyes:  Negative for pain and visual disturbance.  Respiratory:  Negative for cough, chest tightness and shortness of breath.   Cardiovascular:  Positive for leg swelling. Negative for chest pain and palpitations.  Gastrointestinal:  Negative for abdominal pain, blood in stool, constipation, diarrhea, nausea and vomiting.  Endocrine: Negative for polydipsia, polyphagia and polyuria.  Genitourinary:  Negative for dysuria and flank pain.  Musculoskeletal:  Positive for joint swelling (both ankles). Negative for arthralgias (left knee pain x 2 months), back pain, myalgias and neck stiffness.  Skin:  Negative for color change, rash and wound.  Neurological:  Positive for weakness (in left leg) and numbness (in hands). Negative for dizziness, tremors, seizures, speech difficulty, light-headedness and headaches.  Psychiatric/Behavioral:  Negative for behavioral problems, confusion, decreased concentration, dysphoric mood and sleep disturbance. The patient is not nervous/anxious.   All other systems reviewed and are negative.      Objective    Vitals: BP (!) 102/59 (BP Location: Left Arm, Patient Position: Sitting, Cuff Size: Normal)   Pulse 79   Temp 97.7 F (36.5 C) (Temporal)   Resp 18   Ht '6\' 2"'$  (1.88 m)   Wt  199 lb (90.3 kg)   BMI 25.55 kg/m    Physical Exam   General Appearance:     Overweight male. Alert, cooperative, in no acute distress, appears stated age  Head:    Normocephalic, without obvious abnormality, atraumatic  Eyes:    PERRL, conjunctiva/corneas clear, EOM's intact, fundi    benign, both eyes       Ears:    Normal TM's and external ear canals, both ears  Nose:   Nares normal, septum midline, mucosa normal, no drainage   or sinus tenderness  Throat:   Lips, mucosa, and tongue normal; teeth and gums normal  Neck:   Supple, symmetrical, trachea midline, no adenopathy;       thyroid:  No enlargement/tenderness/nodules; no carotid   bruit or JVD  Back:     Symmetric, no curvature, ROM normal, no CVA tenderness  Lungs:     Clear to auscultation bilaterally, respirations unlabored  Chest wall:    No tenderness or deformity  Heart:    Normal heart rate. Normal rhythm. No murmurs, rubs, or gallops.  S1 and S2 normal  Abdomen:     Soft,  non-tender, bowel sounds active all four quadrants,    no masses, no organomegaly  Genitalia:    deferred  Rectal:    deferred  Extremities:   All extremities are intact. No cyanosis or edema  Pulses:   2+ and symmetric all extremities  Skin:   Skin color, texture, turgor normal, no rashes or lesions  Lymph nodes:   Cervical, supraclavicular, and axillary nodes normal  Neurologic:   CNII-XII intact. Normal strength, sensation and reflexes      throughout      Assessment & Plan     1. Annual physical exam   2. Elevated PSA In the setting of UTI which was treated completely last year. He was referred to urology but apparently never followed through with referral.  - PSA  3. Hyperglycemia  - Hemoglobin A1c  4. Essential hypertension Well controlled.  Continue current medications.   - CBC - Lipid panel - Comprehensive metabolic panel  5. Chronic left knee pain Xray left knee.      The entirety of the information documented in  the History of Present Illness, Review of Systems and Physical Exam were personally obtained by me. Portions of this information were initially documented by the CMA and reviewed by me for thoroughness and accuracy.     Lelon Huh, MD  [facility] [phone] (phone) [phone] (fax)  [place]

## 2020-12-31 NOTE — Patient Instructions (Addendum)
Please review the attached list of medications and notify my office if there are any errors.   Go to the [facility] on [address] for knee Xray   Please go to the lab draw station in [address] on the second floor of [facility]  when you are fasting for 8 hours. Normal hours are 8:00am to 11:30am and 1:00pm to 4:00pm Monday through Friday   Please contact your eyecare professional to schedule a routine eye exam   I recommend that you get the Prevnar-20 vaccine to protect yourself from certain strains of pneumonia. Please call our office at [phone] at your earliest convenience to schedule this vaccine.

## 2021-01-03 DIAGNOSIS — R739 Hyperglycemia, unspecified: Secondary | ICD-10-CM | POA: Diagnosis not present

## 2021-01-03 DIAGNOSIS — R972 Elevated prostate specific antigen [PSA]: Secondary | ICD-10-CM | POA: Diagnosis not present

## 2021-01-03 DIAGNOSIS — I10 Essential (primary) hypertension: Secondary | ICD-10-CM | POA: Diagnosis not present

## 2021-01-04 ENCOUNTER — Telehealth: Payer: Self-pay

## 2021-01-04 ENCOUNTER — Encounter: Payer: Self-pay | Admitting: Family Medicine

## 2021-01-04 DIAGNOSIS — G8929 Other chronic pain: Secondary | ICD-10-CM

## 2021-01-04 DIAGNOSIS — M25562 Pain in left knee: Secondary | ICD-10-CM

## 2021-01-04 LAB — COMPREHENSIVE METABOLIC PANEL
ALT: 16 IU/L (ref 0–44)
AST: 19 IU/L (ref 0–40)
Albumin/Globulin Ratio: 1.5 (ref 1.2–2.2)
Albumin: 3.9 g/dL (ref 3.7–4.7)
Alkaline Phosphatase: 88 IU/L (ref 44–121)
BUN/Creatinine Ratio: 17 (ref 10–24)
BUN: 27 mg/dL (ref 8–27)
Bilirubin Total: 0.5 mg/dL (ref 0.0–1.2)
CO2: 21 mmol/L (ref 20–29)
Calcium: 8.6 mg/dL (ref 8.6–10.2)
Chloride: 109 mmol/L — ABNORMAL HIGH (ref 96–106)
Creatinine, Ser: 1.55 mg/dL — ABNORMAL HIGH (ref 0.76–1.27)
Globulin, Total: 2.6 g/dL (ref 1.5–4.5)
Glucose: 105 mg/dL — ABNORMAL HIGH (ref 65–99)
Potassium: 4.2 mmol/L (ref 3.5–5.2)
Sodium: 145 mmol/L — ABNORMAL HIGH (ref 134–144)
Total Protein: 6.5 g/dL (ref 6.0–8.5)
eGFR: 46 mL/min/{1.73_m2} — ABNORMAL LOW (ref >59)

## 2021-01-04 LAB — CBC
Hematocrit: 39.6 % (ref 37.5–51.0)
Hemoglobin: 13 g/dL (ref 13.0–17.7)
MCH: 30.3 pg (ref 26.6–33.0)
MCHC: 32.8 g/dL (ref 31.5–35.7)
MCV: 92 fL (ref 79–97)
Platelets: 163 10*3/uL (ref 150–450)
RBC: 4.29 x10E6/uL (ref 4.14–5.80)
RDW: 12.7 % (ref 11.6–15.4)
WBC: 8.5 10*3/uL (ref 3.4–10.8)

## 2021-01-04 LAB — LIPID PANEL
Chol/HDL Ratio: 5.1 ratio — ABNORMAL HIGH (ref 0.0–5.0)
Cholesterol, Total: 122 mg/dL (ref 100–199)
HDL: 24 mg/dL — ABNORMAL LOW (ref >39)
LDL Chol Calc (NIH): 78 mg/dL (ref 0–99)
Triglycerides: 108 mg/dL (ref 0–149)
VLDL Cholesterol Cal: 20 mg/dL (ref 5–40)

## 2021-01-04 LAB — PSA: Prostate Specific Ag, Serum: 8.6 ng/mL — ABNORMAL HIGH (ref 0.0–4.0)

## 2021-01-04 LAB — HEMOGLOBIN A1C
Est. average glucose Bld gHb Est-mCnc: 123 mg/dL
Hgb A1c MFr Bld: 5.9 % — ABNORMAL HIGH (ref 4.8–5.6)

## 2021-01-04 NOTE — Telephone Encounter (Signed)
-----   Message from Birdie Sons, MD sent at 01/03/2021  5:10 PM EDT ----- Xrays shows moderate arthritis in the knee joint. He could try OTC Voltaren Gel, or we could refer him to orthopedics for injection which usually works well.

## 2021-01-04 NOTE — Telephone Encounter (Signed)
Pt advised.  He is going to try OTC Voltaren Gel but would also like a referral to orthopedics.   Thanks,   -Mickel Baas

## 2021-01-07 ENCOUNTER — Encounter: Payer: Self-pay | Admitting: Family Medicine

## 2021-01-12 DIAGNOSIS — M2242 Chondromalacia patellae, left knee: Secondary | ICD-10-CM | POA: Insufficient documentation

## 2021-02-07 ENCOUNTER — Other Ambulatory Visit: Payer: Self-pay | Admitting: Family Medicine

## 2021-02-07 DIAGNOSIS — R1319 Other dysphagia: Secondary | ICD-10-CM

## 2021-04-19 ENCOUNTER — Ambulatory Visit: Payer: Self-pay | Admitting: *Deleted

## 2021-04-19 NOTE — Telephone Encounter (Signed)
He needs to go to urgent care

## 2021-04-19 NOTE — Telephone Encounter (Signed)
C/o wheezing x 3 days esp. When exhaling. "Feels like walking pneumonia". C/o runny nose esp. When he eats. Cough, mucus white to yellow in color. Denies fever, chest pain,, difficulty breathing or fever. Requesting antibiotic, ampicillin in the past has worked for him, per patient. Recommended ED or UC . Patient declined and would like to speak to PCP. Earliest appt 05/27/21 and patient instructed to go to UC or ED . Patient is upset and wants call back from PCP with recommendation for wheezing and medications. Please advise .  Patient reports he has been taking mucinex. Recommended to stay hydrated but not consume too many liquids due to being on medication lasix. Care advise given. Patient verbalized understanding of care advise and to call back or go to UC or ED and declines advise at this time.     Reason for Disposition  Wheezing can be heard across the room  Answer Assessment - Initial Assessment Questions 1. RESPIRATORY STATUS: "Describe your breathing?" (e.g., wheezing, shortness of breath, unable to speak, severe coughing)      Wheezing denies difficulty breathing  2. ONSET: "When did this breathing problem begin?"      3 days ago  3. PATTERN "Does the difficult breathing come and go, or has it been constant since it started?"      Wheezing now , reports wheezing esp. With exhaling  4. SEVERITY: "How bad is your breathing?" (e.g., mild, moderate, severe)    - MILD: No SOB at rest, mild SOB with walking, speaks normally in sentences, can lie down, no retractions, pulse < 100.    - MODERATE: SOB at rest, SOB with minimal exertion and prefers to sit, cannot lie down flat, speaks in phrases, mild retractions, audible wheezing, pulse 100-120.    - SEVERE: Very SOB at rest, speaks in single words, struggling to breathe, sitting hunched forward, retractions, pulse > 120      Moderate esp when walking  5. RECURRENT SYMPTOM: "Have you had difficulty breathing before?" If Yes, ask: "When was the last  time?" and "What happened that time?"      Na  6. CARDIAC HISTORY: "Do you have any history of heart disease?" (e.g., heart attack, angina, bypass surgery, angioplasty)      Heart issues  7. LUNG HISTORY: "Do you have any history of lung disease?"  (e.g., pulmonary embolus, asthma, emphysema)     Denies  8. CAUSE: "What do you think is causing the breathing problem?"      Coughing up white yellow sputum 9. OTHER SYMPTOMS: "Do you have any other symptoms? (e.g., dizziness, runny nose, cough, chest pain, fever)     Runny nose cough 10. O2 SATURATION MONITOR:  "Do you use an oxygen saturation monitor (pulse oximeter) at home?" If Yes, "What is your reading (oxygen level) today?" "What is your usual oxygen saturation reading?" (e.g., 95%)       na 11. PREGNANCY: "Is there any chance you are pregnant?" "When was your last menstrual period?"       na 12. TRAVEL: "Have you traveled out of the country in the last month?" (e.g., travel history, exposures)       na  Protocols used: Breathing Difficulty-A-AH

## 2021-04-19 NOTE — Telephone Encounter (Signed)
Pt advised.   Thanks,   -Shamon Cothran  

## 2021-06-06 ENCOUNTER — Ambulatory Visit: Payer: Self-pay | Admitting: *Deleted

## 2021-06-06 NOTE — Telephone Encounter (Signed)
°  Chief Complaint: cough Symptoms: Productive cough, "Maybe dried blood at times" Frequency: Onset 3 weeks ago, now SOB with exertion only Pertinent Negatives: Patient denies fever Disposition: [] ED /[] Urgent Care (no appt availability in office) / [x] Appointment(In office/virtual)/ []  [place] [facility]/ [] Home Care/ [] Refused Recommended Disposition /[] [place]/ []  Follow-up with PCP Additional Notes: Requesting CXR, advised needs appt. Called practice for consult, Jiles Garter.  Pt has appt tomorrow Reason for Disposition  Coughing up rusty-colored (reddish-brown) sputum  Answer Assessment - Initial Assessment Questions 1. ONSET: "When did the cough begin?"      3 weeks ago 2. SEVERITY: "How bad is the cough today?"      "Spells every few hours" 3. SPUTUM: "Describe the color of your sputum" (none, dry cough; clear, white, yellow, green)     Yellowish brown 4. HEMOPTYSIS: "Are you coughing up any blood?" If so ask: "How much?" (flecks, streaks, tablespoons, etc.)     "Looks like dried blood" 5. DIFFICULTY BREATHING: "Are you having difficulty breathing?" If Yes, ask: "How bad is it?" (e.g., mild, moderate, severe)    - MILD: No SOB at rest, mild SOB with walking, speaks normally in sentences, can lie down, no retractions, pulse < 100.    - MODERATE: SOB at rest, SOB with minimal exertion and prefers to sit, cannot lie down flat, speaks in phrases, mild retractions, audible wheezing, pulse 100-120.    - SEVERE: Very SOB at rest, speaks in single words, struggling to breathe, sitting hunched forward, retractions, pulse > 120      Mild 6. FEVER: "Do you have a fever?" If Yes, ask: "What is your temperature, how was it measured, and when did it start?"     No 7. CARDIAC HISTORY: "Do you have any history of heart disease?" (e.g., heart attack, congestive heart failure)       8. LUNG HISTORY: "Do you have any history of lung disease?"  (e.g., pulmonary embolus, asthma,  emphysema)      9. PE RISK FACTORS: "Do you have a history of blood clots?" (or: recent major surgery, recent prolonged travel, bedridden)   10. OTHER SYMPTOMS: "Do you have any other symptoms?" (e.g., runny nose, wheezing, chest pain)       SOB with exertion only  Protocols used: Cough - Acute Productive-A-AH

## 2021-06-07 ENCOUNTER — Ambulatory Visit (INDEPENDENT_AMBULATORY_CARE_PROVIDER_SITE_OTHER): Payer: PPO | Admitting: Family Medicine

## 2021-06-07 ENCOUNTER — Encounter: Payer: Self-pay | Admitting: Family Medicine

## 2021-06-07 ENCOUNTER — Other Ambulatory Visit: Payer: Self-pay

## 2021-06-07 VITALS — BP 137/75 | HR 96 | Temp 98.4°F | Resp 22 | Wt 203.0 lb

## 2021-06-07 DIAGNOSIS — J300 Vasomotor rhinitis: Secondary | ICD-10-CM

## 2021-06-07 DIAGNOSIS — R052 Subacute cough: Secondary | ICD-10-CM | POA: Diagnosis not present

## 2021-06-07 MED ORDER — AZITHROMYCIN 250 MG PO TABS: ORAL_TABLET | ORAL | 0 refills | Status: AC

## 2021-06-07 NOTE — Progress Notes (Signed)
Established patient visit   Patient: Darius Parker   DOB: 11/06/1945   76 y.o. Male  MRN: [id number] Visit Date: 06/07/2021  Today's healthcare provider: Lelon Huh, MD   Chief Complaint  Patient presents with   Cough   Subjective    Cough This is a new problem. Episode onset: 3 weeks ago. The problem has been gradually worsening. The cough is Productive of sputum (reddish brown sputum). Associated symptoms include postnasal drip, a sore throat, shortness of breath (with exertion) and wheezing. Pertinent negatives include no chest pain, chills or fever. Treatments tried: Mucinex. The treatment provided no relief.     Hasn't taken Covid test. Had two doses of original vaccine.   Medications: Outpatient Medications Prior to Visit  Medication Sig   aspirin EC 81 MG tablet Take 162 mg by mouth daily. Swallow whole.   atorvastatin (LIPITOR) 10 MG tablet Take 10 mg at bedtime by mouth.   carvedilol (COREG) 12.5 MG tablet Take 12.5 mg by mouth 2 (two) times daily with a meal.   enalapril (VASOTEC) 10 MG tablet Take 10 mg by mouth 2 (two) times daily.   famotidine (PEPCID) 20 MG tablet TAKE 1 TABLET BY MOUTH TWICE A DAY   furosemide (LASIX) 20 MG tablet Take 20 mg by mouth daily.   omega-3 acid ethyl esters (LOVAZA) 1 g capsule Take 1 capsule (1 g total) by mouth 2 (two) times daily.   tamsulosin (FLOMAX) 0.4 MG CAPS capsule Take 1 capsule (0.4 mg total) by mouth daily.   terbinafine (LAMISIL) 250 MG tablet Take 2 tablets (500 mg total) by mouth daily. Take for 1 week out of every 4 weeks   meloxicam (MOBIC) 15 MG tablet Take 15 mg by mouth daily.   No facility-administered medications prior to visit.    Review of Systems  Constitutional:  Positive for diaphoresis and fatigue. Negative for appetite change, chills and fever.  HENT:  Positive for postnasal drip and sore throat.   Respiratory:  Positive for cough, shortness of breath (with exertion) and wheezing. Negative for  chest tightness.   Cardiovascular:  Negative for chest pain and palpitations.  Gastrointestinal:  Negative for abdominal pain, nausea and vomiting.      Objective    BP 137/75 (BP Location: Left Arm, Patient Position: Sitting, Cuff Size: Normal)    Pulse 96    Temp 98.4 F (36.9 C) (Oral)    Resp (!) 22    Wt 203 lb (92.1 kg)    SpO2 99% Comment: room air   BMI 26.06 kg/m  {Show previous vital signs (optional):23777}  Physical Exam   General: Appearance:     Well developed, well nourished male in no acute distress  Eyes:    PERRL, conjunctiva/corneas clear, EOM's intact       Lungs:     Clear to auscultation bilaterally, respirations unlabored Occasional expiratory wheezes.   Heart:    Normal heart rate. Normal rhythm. No murmurs, rubs, or gallops.    MS:   All extremities are intact.    Neurologic:   Awake, alert, oriented x 3. No apparent focal neurological defect.         Assessment & Plan     1. Vasomotor rhinitis Occuring every time he eats for the last several weeks. Recommend he try OTC Xyzal or Zyrtec  2. Subacute cough X 3 weeks. May be related to vasomotor rhinitis above. Occasional wheezes on exam concerning for infection.   -  azithromycin (ZITHROMAX) 250 MG tablet; Take 2 tablets on day 1, then 1 tablet daily on days 2 through 5  Dispense: 6 tablet; Refill: 0   Call if symptoms change or if not rapidly improving.     Flu vaccine was recommended but refused by patient      The entirety of the information documented in the History of Present Illness, Review of Systems and Physical Exam were personally obtained by me. Portions of this information were initially documented by the CMA and reviewed by me for thoroughness and accuracy.     Lelon Huh, MD  [facility] [phone] (phone) [phone] (fax)  [place]

## 2021-06-07 NOTE — Patient Instructions (Addendum)
Please review the attached list of medications and notify my office if there are any errors.   Please bring all of your medications to every appointment so we can make sure that our medication list is the same as yours.   Start taking either Zyrtec or Xyzal (cetrizine or levoceterizine) to see if that helps with sinus drainage

## 2021-06-15 ENCOUNTER — Ambulatory Visit (INDEPENDENT_AMBULATORY_CARE_PROVIDER_SITE_OTHER): Payer: PPO | Admitting: Family Medicine

## 2021-06-15 ENCOUNTER — Other Ambulatory Visit: Payer: Self-pay

## 2021-06-15 ENCOUNTER — Encounter: Payer: Self-pay | Admitting: Family Medicine

## 2021-06-15 VITALS — BP 130/79 | HR 82 | Ht 74.0 in | Wt 200.1 lb

## 2021-06-15 DIAGNOSIS — R1310 Dysphagia, unspecified: Secondary | ICD-10-CM | POA: Diagnosis not present

## 2021-06-15 DIAGNOSIS — I5022 Chronic systolic (congestive) heart failure: Secondary | ICD-10-CM | POA: Diagnosis not present

## 2021-06-15 DIAGNOSIS — E782 Mixed hyperlipidemia: Secondary | ICD-10-CM | POA: Diagnosis not present

## 2021-06-15 DIAGNOSIS — R0609 Other forms of dyspnea: Secondary | ICD-10-CM

## 2021-06-15 DIAGNOSIS — I10 Essential (primary) hypertension: Secondary | ICD-10-CM | POA: Diagnosis not present

## 2021-06-15 NOTE — Progress Notes (Signed)
I,Sha'taria Tyson,acting as a Education administrator for Lelon Huh, MD.,have documented all relevant documentation on the behalf of Lelon Huh, MD,as directed by  Lelon Huh, MD while in the presence of Lelon Huh, MD.   Established patient visit   Patient: Darius Parker   DOB: 07/15/1945   76 y.o. Male  MRN: [id number] Visit Date: 06/15/2021  Today's healthcare provider: Lelon Huh, MD   Chief Complaint  Patient presents with   Hypertension   Hyperlipidemia   Follow-up   Subjective    HPI  -Patient reports finishing antibiotic on Monday and has been taking antihistamines -Patient reports being short winded when he walks lately -Patient reports when coughing he has mucus come up that has brown center and clear around it. Reports that it has taken place now for about 2-3 weeks.  Hypertension, follow-up  BP Readings from Last 3 Encounters:  06/07/21 137/75  12/31/20 (!) 102/59  06/07/20 107/68   Wt Readings from Last 3 Encounters:  06/07/21 203 lb (92.1 kg)  12/31/20 199 lb (90.3 kg)  06/07/20 175 lb (79.4 kg)     He was last seen for hypertension 5 months ago.  BP at that visit was 102/59. Management since that visit includes continue current medication.  He reports excellent compliance with treatment. He is not having side effects.  He is following a Low Sodium diet. He is not exercising. He does not smoke.  Use of agents associated with hypertension: none.   Outside blood pressures are none. Symptoms: No chest pain No chest pressure  No palpitations No syncope  Yes dyspnea Yes orthopnea  No paroxysmal nocturnal dyspnea Yes lower extremity edema   Pertinent labs: Lab Results  Component Value Date   NA 145 (H) 01/03/2021   K 4.2 01/03/2021   CREATININE 1.55 (H) 01/03/2021   EGFR 46 (L) 01/03/2021   GLUCOSE 105 (H) 01/03/2021   TSH 5.610 (H) 12/24/2019     The ASCVD Risk score (Arnett DK, et al., 2019) failed to calculate for the following  reasons:   The valid total cholesterol range is 130 to 320 mg/dL   --------------------------------------------------------------------------------------------------- Lipid/Cholesterol, Follow-up  Last lipid panel Other pertinent labs  Lab Results  Component Value Date   CHOL 122 01/03/2021   HDL 24 (L) 01/03/2021   LDLCALC 78 01/03/2021   TRIG 108 01/03/2021   CHOLHDL 5.1 (H) 01/03/2021   Lab Results  Component Value Date   ALT 16 01/03/2021   AST 19 01/03/2021   PLT 163 01/03/2021   TSH 5.610 (H) 12/24/2019     He was last seen for this 5 months ago.  Management since that visit includes continue current medications.  He reports excellent compliance with treatment. He is not having side effects.   Symptoms: No chest pain No chest pressure/discomfort  Yes dyspnea Yes lower extremity edema  Yes numbness or tingling of extremity Yes orthopnea  No palpitations No paroxysmal nocturnal dyspnea  No speech difficulty No syncope   Current diet: in general, a "healthy" diet   Current exercise: none  The ASCVD Risk score (Arnett DK, et al., 2019) failed to calculate for the following reasons:   The valid total cholesterol range is 130 to 320 mg/dL    --------------------------------------------------------------------------------------------------- Follow up CHF: He is followed by Dr. Nehemiah Massed last seen April 2022. He is taking medications consistently but notice more swelling since being on carvedilol. He also states he gets short winded when walking to his mailbox which limits  distance he can walk. Denies any chest pains.   He was seen for persistent cough last week and start on azithromycin and OTC NS antihistamine. He states he continues to have cough sometimes productive of yellow sputum.   He also states today that he frequently gets sensation of mucous, drink, or food getting stuck in his chest when he swallows making it difficult to continue eating.    Medications: Outpatient Medications Prior to Visit  Medication Sig   aspirin EC 81 MG tablet Take 162 mg by mouth daily. Swallow whole.   atorvastatin (LIPITOR) 10 MG tablet Take 10 mg at bedtime by mouth.   carvedilol (COREG) 12.5 MG tablet Take 12.5 mg by mouth 2 (two) times daily with a meal.   enalapril (VASOTEC) 10 MG tablet Take 10 mg by mouth 2 (two) times daily.   famotidine (PEPCID) 20 MG tablet TAKE 1 TABLET BY MOUTH TWICE A DAY   furosemide (LASIX) 20 MG tablet Take 20 mg by mouth daily.   meloxicam (MOBIC) 15 MG tablet Take 15 mg by mouth daily.   omega-3 acid ethyl esters (LOVAZA) 1 g capsule Take 1 capsule (1 g total) by mouth 2 (two) times daily.   tamsulosin (FLOMAX) 0.4 MG CAPS capsule Take 1 capsule (0.4 mg total) by mouth daily.   terbinafine (LAMISIL) 250 MG tablet Take 2 tablets (500 mg total) by mouth daily. Take for 1 week out of every 4 weeks   No facility-administered medications prior to visit.         Objective    BP 130/79 (BP Location: Right Arm, Patient Position: Sitting, Cuff Size: Normal)    Pulse 82    Ht _0  (1.88 m)    Wt 200 lb 1.6 oz (90.8 kg)    SpO2 100%    BMI 25.69 kg/m  {Show previous vital signs (optional):23777}  Physical Exam   General: Appearance:     Well developed, well nourished male in no acute distress  Eyes:    PERRL, conjunctiva/corneas clear, EOM's intact       Lungs:     Clear to auscultation bilaterally, respirations unlabored  Heart:    Normal heart rate. Normal rhythm. No murmurs, rubs, or gallops.    MS:   All extremities are intact.    Neurologic:   Awake, alert, oriented x 3. No apparent focal neurological defect.        Assessment & Plan     1. Essential hypertension Fairly well controlled, Continue current medications.    2. Chronic systolic CHF (congestive heart failure), NYHA class 2 ([place]) Doing well current meds but is having some DOE below. Anticipates follow up with Dr. Nehemiah Massed in the Spring.  -  CBC - TSH  3. Hyperlipidemia, mixed He is tolerating atorvastatin well with no adverse effects.   - Comprehensive metabolic panel - Lipid panel - TSH  4. Dyspnea on exertion  - Brain natriuretic peptide  5. Dysphagia, unspecified type  - DG UGI W SINGLE CM (SOL OR THIN BA); Future         The entirety of the information documented in the History of Present Illness, Review of Systems and Physical Exam were personally obtained by me. Portions of this information were initially documented by the CMA and reviewed by me for thoroughness and accuracy.     Lelon Huh, MD  [facility] [phone] (phone) [phone] (fax)  [place]

## 2021-06-16 LAB — LIPID PANEL
Chol/HDL Ratio: 4.7 ratio (ref 0.0–5.0)
Cholesterol, Total: 123 mg/dL (ref 100–199)
HDL: 26 mg/dL — ABNORMAL LOW (ref >39)
LDL Chol Calc (NIH): 79 mg/dL (ref 0–99)
Triglycerides: 96 mg/dL (ref 0–149)
VLDL Cholesterol Cal: 18 mg/dL (ref 5–40)

## 2021-06-16 LAB — COMPREHENSIVE METABOLIC PANEL
ALT: 14 IU/L (ref 0–44)
AST: 22 IU/L (ref 0–40)
Albumin/Globulin Ratio: 1.5 (ref 1.2–2.2)
Albumin: 4.3 g/dL (ref 3.7–4.7)
Alkaline Phosphatase: 106 IU/L (ref 44–121)
BUN/Creatinine Ratio: 18 (ref 10–24)
BUN: 28 mg/dL — ABNORMAL HIGH (ref 8–27)
Bilirubin Total: 0.4 mg/dL (ref 0.0–1.2)
CO2: 20 mmol/L (ref 20–29)
Calcium: 9.1 mg/dL (ref 8.6–10.2)
Chloride: 112 mmol/L — ABNORMAL HIGH (ref 96–106)
Creatinine, Ser: 1.59 mg/dL — ABNORMAL HIGH (ref 0.76–1.27)
Globulin, Total: 2.9 g/dL (ref 1.5–4.5)
Glucose: 113 mg/dL — ABNORMAL HIGH (ref 70–99)
Potassium: 4.8 mmol/L (ref 3.5–5.2)
Sodium: 148 mmol/L — ABNORMAL HIGH (ref 134–144)
Total Protein: 7.2 g/dL (ref 6.0–8.5)
eGFR: 44 mL/min/{1.73_m2} — ABNORMAL LOW (ref >59)

## 2021-06-16 LAB — CBC
Hematocrit: 38.4 % (ref 37.5–51.0)
Hemoglobin: 12.8 g/dL — ABNORMAL LOW (ref 13.0–17.7)
MCH: 29.5 pg (ref 26.6–33.0)
MCHC: 33.3 g/dL (ref 31.5–35.7)
MCV: 89 fL (ref 79–97)
Platelets: 252 10*3/uL (ref 150–450)
RBC: 4.34 x10E6/uL (ref 4.14–5.80)
RDW: 12.8 % (ref 11.6–15.4)
WBC: 9.1 10*3/uL (ref 3.4–10.8)

## 2021-06-16 LAB — BRAIN NATRIURETIC PEPTIDE: BNP: 170.1 pg/mL — ABNORMAL HIGH (ref 0.0–100.0)

## 2021-06-16 LAB — TSH: TSH: 6.02 u[IU]/mL — ABNORMAL HIGH (ref 0.450–4.500)

## 2021-06-20 ENCOUNTER — Telehealth: Payer: Self-pay

## 2021-06-20 DIAGNOSIS — E039 Hypothyroidism, unspecified: Secondary | ICD-10-CM

## 2021-06-20 MED ORDER — LEVOTHYROXINE SODIUM 25 MCG PO TABS: 25.0000 ug | ORAL_TABLET | Freq: Every day | ORAL | 1 refills | Status: DC

## 2021-06-20 NOTE — Telephone Encounter (Signed)
Pt calling back to follow up. Pt requesting a call back.

## 2021-06-20 NOTE — Telephone Encounter (Signed)
Patient advised and agrees with treatment plan. Patient verbalized understanding. Follow up appointment scheduled. Prescription sent into pharmacy.

## 2021-06-20 NOTE — Telephone Encounter (Signed)
Copied from [place] [id number]. Topic: General - Other >> Jun 20, 2021 11:01 AM Pawlus, Brayton Layman A wrote: Reason for CRM: Pt wanted a call back to go over his latest lab results, please advise.

## 2021-06-20 NOTE — Telephone Encounter (Signed)
-----   Message from Birdie Sons, MD sent at 06/17/2021  7:29 AM EST ----- Is a little bit hypothyroid. Need to start levothyroxine 59mcg one tablet daily. #30 RF x 1.  BNP is elevated indicating a little strain on his heart. Need to increase lasix a little bit. Recommend he take 2 x 20mg  on mondays, wednesdays and Fridays, one 1 tablet on other days.  Please schedule a follow up in 1 month to check on thyroid

## 2021-06-21 ENCOUNTER — Telehealth: Payer: Self-pay

## 2021-06-21 DIAGNOSIS — R051 Acute cough: Secondary | ICD-10-CM

## 2021-06-21 NOTE — Telephone Encounter (Signed)
Theres an order in place. He can just go to [facility], doesn't need an appointment.

## 2021-06-21 NOTE — Telephone Encounter (Signed)
Copied from [place] [id number]. Topic: General - Inquiry >> Jun 21, 2021 11:50 AM Parke Poisson wrote: Reason for CRM: Pt states he thought that a chest x ray was going to be ordered for him. He states he is coughing up yellow phlegm

## 2021-06-21 NOTE — Addendum Note (Signed)
Addended by: Birdie Sons on: 06/21/2021 05:09 PM   Modules accepted: Orders

## 2021-06-22 ENCOUNTER — Ambulatory Visit
Admission: RE | Admit: 2021-06-22 | Discharge: 2021-06-22 | Disposition: A | Discharge status: Home / Self Care | Payer: PPO | Source: Ambulatory Visit | Attending: Family Medicine | Admitting: Family Medicine

## 2021-06-22 ENCOUNTER — Ambulatory Visit
Admission: RE | Admit: 2021-06-22 | Discharge: 2021-06-22 | Disposition: A | Discharge status: Home / Self Care | Payer: PPO | Attending: Family Medicine | Admitting: Family Medicine

## 2021-06-22 ENCOUNTER — Other Ambulatory Visit: Payer: Self-pay

## 2021-06-22 DIAGNOSIS — R059 Cough, unspecified: Secondary | ICD-10-CM | POA: Diagnosis not present

## 2021-06-22 DIAGNOSIS — R051 Acute cough: Secondary | ICD-10-CM | POA: Diagnosis not present

## 2021-06-22 NOTE — Telephone Encounter (Signed)
Patient advised.

## 2021-06-27 ENCOUNTER — Ambulatory Visit
Admission: RE | Admit: 2021-06-27 | Discharge: 2021-06-27 | Disposition: A | Discharge status: Home / Self Care | Payer: PPO | Source: Ambulatory Visit | Attending: Family Medicine | Admitting: Family Medicine

## 2021-06-27 ENCOUNTER — Other Ambulatory Visit: Payer: Self-pay

## 2021-06-27 DIAGNOSIS — R1310 Dysphagia, unspecified: Secondary | ICD-10-CM | POA: Insufficient documentation

## 2021-06-27 DIAGNOSIS — K449 Diaphragmatic hernia without obstruction or gangrene: Secondary | ICD-10-CM | POA: Diagnosis not present

## 2021-06-28 ENCOUNTER — Other Ambulatory Visit: Payer: Self-pay

## 2021-06-28 DIAGNOSIS — K224 Dyskinesia of esophagus: Secondary | ICD-10-CM

## 2021-06-29 ENCOUNTER — Telehealth: Payer: Self-pay

## 2021-06-29 NOTE — Telephone Encounter (Signed)
Called no answer voicemail not set up

## 2021-06-30 ENCOUNTER — Telehealth: Payer: Self-pay

## 2021-06-30 NOTE — Telephone Encounter (Signed)
Scheduled for 10/04/2021

## 2021-07-06 DIAGNOSIS — H3554 Dystrophies primarily involving the retinal pigment epithelium: Secondary | ICD-10-CM | POA: Diagnosis not present

## 2021-07-12 ENCOUNTER — Other Ambulatory Visit: Payer: Self-pay | Admitting: Family Medicine

## 2021-07-12 DIAGNOSIS — E039 Hypothyroidism, unspecified: Secondary | ICD-10-CM

## 2021-07-25 ENCOUNTER — Ambulatory Visit (INDEPENDENT_AMBULATORY_CARE_PROVIDER_SITE_OTHER): Payer: PPO | Admitting: Family Medicine

## 2021-07-25 ENCOUNTER — Encounter: Payer: Self-pay | Admitting: Family Medicine

## 2021-07-25 ENCOUNTER — Other Ambulatory Visit: Payer: Self-pay

## 2021-07-25 VITALS — BP 136/80 | HR 88 | Resp 16 | Wt 201.3 lb

## 2021-07-25 DIAGNOSIS — I10 Essential (primary) hypertension: Secondary | ICD-10-CM

## 2021-07-25 DIAGNOSIS — R0609 Other forms of dyspnea: Secondary | ICD-10-CM | POA: Diagnosis not present

## 2021-07-25 DIAGNOSIS — E039 Hypothyroidism, unspecified: Secondary | ICD-10-CM

## 2021-07-25 DIAGNOSIS — I5022 Chronic systolic (congestive) heart failure: Secondary | ICD-10-CM

## 2021-07-25 NOTE — Progress Notes (Signed)
?  ? ? ?Established patient visit ? ? ?Patient: Darius Parker   DOB: [birth year]   76 y.o. Male  MRN: [id number] ?Visit Date: 07/25/2021 ? ?Today's healthcare provider: Lelon Huh, MD  ? ?Chief Complaint  ?Patient presents with  ? follow-up thyroid  ? ?Subjective  ?  ?HPI  ?Hypothyroid, follow-up ? ?Lab Results  ?Component Value Date  ? TSH 6.020 (H) 06/15/2021  ? TSH 5.610 (H) 12/24/2019  ? ? ?Wt Readings from Last 3 Encounters:  ?07/25/21 201 lb 4.8 oz (91.3 kg)  ?06/15/21 200 lb 1.6 oz (90.8 kg)  ?06/07/21 203 lb (92.1 kg)  ? ? ?He was last seen for hypothyroid 1 months ago.  ?Management since that visit includes start Levothyroxine 25 mcg . ?He reports excellent compliance with treatment. ?He is not having side effects.  ? ?Symptoms: ?Yes change in energy level No constipation  ?No diarrhea No heat / cold intolerance  ?No nervousness No palpitations  ?Yes weight changes   ? ?-----------------------------------------------------------------------------------------  ? ?Medications: ?Outpatient Medications Prior to Visit  ?Medication Sig  ? aspirin EC 81 MG tablet Take 162 mg by mouth daily. Swallow whole.  ? atorvastatin (LIPITOR) 10 MG tablet Take 10 mg at bedtime by mouth.  ? carvedilol (COREG) 12.5 MG tablet Take 12.5 mg by mouth 2 (two) times daily with a meal.  ? enalapril (VASOTEC) 10 MG tablet Take 10 mg by mouth 2 (two) times daily.  ? famotidine (PEPCID) 20 MG tablet TAKE 1 TABLET BY MOUTH TWICE A DAY  ? furosemide (LASIX) 20 MG tablet Take 20 mg by mouth daily.  ? levothyroxine (SYNTHROID) 25 MCG tablet Take 1 tablet (25 mcg total) by mouth daily.  ? meloxicam (MOBIC) 15 MG tablet Take 15 mg by mouth daily.  ? omega-3 acid ethyl esters (LOVAZA) 1 g capsule Take 1 capsule (1 g total) by mouth 2 (two) times daily.  ? tamsulosin (FLOMAX) 0.4 MG CAPS capsule Take 1 capsule (0.4 mg total) by mouth daily.  ? terbinafine (LAMISIL) 250 MG tablet Take 2 tablets (500 mg total) by mouth daily. Take for 1 week out  of every 4 weeks (Patient not taking: Reported on 07/25/2021)  ? ?No facility-administered medications prior to visit.  ? ? ?Review of Systems  ?Constitutional:  Negative for appetite change, chills and fever.  ?Respiratory:  Positive for shortness of breath. Negative for chest tightness and wheezing.   ?Cardiovascular:  Negative for chest pain and palpitations.  ?Gastrointestinal:  Negative for abdominal pain, nausea and vomiting.  ? ? ?  Objective  ?  ?BP 136/80 (BP Location: Left Arm, Patient Position: Sitting, Cuff Size: Normal)   Pulse 88   Resp 16   Wt 201 lb 4.8 oz (91.3 kg)   BMI 25.85 kg/m?  ? ? ?Physical Exam  ? ? ?General: Appearance:     ?Overweight male in no acute distress  ?Eyes:    PERRL, conjunctiva/corneas clear, EOM's intact       ?Lungs:     Clear to auscultation bilaterally, respirations unlabored  ?Heart:    Normal heart rate. Normal rhythm. No murmurs, rubs, or gallops.    ?MS:   All extremities are intact.  Trace bilateral edema.   ?Neurologic:   Awake, alert, oriented x 3. No apparent focal neurological defect.   ?   ?  ? Assessment & Plan  ?  ? ?1. Hypothyroidism, unspecified type ?Newly diagnosis. Tolerating initiation of levothyroxine ?- TSH ?- T4, free ? ?2.  Essential hypertension ?Well controlled.  Continue current medications.   ? ?3. Chronic systolic CHF (congestive heart failure), NYHA class 2 ([place]) ?Edema improved since increasing furosemide.  ?- CBC ?- Comprehensive metabolic panel ? ?4. Dyspnea on exertion ? ?- B Nat Peptide  ?   ? ?The entirety of the information documented in the History of Present Illness, Review of Systems and Physical Exam were personally obtained by me. Portions of this information were initially documented by the CMA and reviewed by me for thoroughness and accuracy.   ? ? ?Lelon Huh, MD  ?[facility] ?[phone] (phone) ?[phone] (fax) ? ?[facility]  ?

## 2021-07-26 LAB — COMPREHENSIVE METABOLIC PANEL
ALT: 14 IU/L (ref 0–44)
AST: 20 IU/L (ref 0–40)
Albumin/Globulin Ratio: 1.8 (ref 1.2–2.2)
Albumin: 4.3 g/dL (ref 3.7–4.7)
Alkaline Phosphatase: 102 IU/L (ref 44–121)
BUN/Creatinine Ratio: 18 (ref 10–24)
BUN: 33 mg/dL — ABNORMAL HIGH (ref 8–27)
Bilirubin Total: 0.3 mg/dL (ref 0.0–1.2)
CO2: 19 mmol/L — ABNORMAL LOW (ref 20–29)
Calcium: 9 mg/dL (ref 8.6–10.2)
Chloride: 112 mmol/L — ABNORMAL HIGH (ref 96–106)
Creatinine, Ser: 1.84 mg/dL — ABNORMAL HIGH (ref 0.76–1.27)
Globulin, Total: 2.4 g/dL (ref 1.5–4.5)
Glucose: 200 mg/dL — ABNORMAL HIGH (ref 70–99)
Potassium: 4.9 mmol/L (ref 3.5–5.2)
Sodium: 147 mmol/L — ABNORMAL HIGH (ref 134–144)
Total Protein: 6.7 g/dL (ref 6.0–8.5)
eGFR: 37 mL/min/{1.73_m2} — ABNORMAL LOW (ref >59)

## 2021-07-26 LAB — BRAIN NATRIURETIC PEPTIDE: BNP: 142.3 pg/mL — ABNORMAL HIGH (ref 0.0–100.0)

## 2021-07-26 LAB — CBC
Hematocrit: 37.6 % (ref 37.5–51.0)
Hemoglobin: 12.6 g/dL — ABNORMAL LOW (ref 13.0–17.7)
MCH: 30 pg (ref 26.6–33.0)
MCHC: 33.5 g/dL (ref 31.5–35.7)
MCV: 90 fL (ref 79–97)
Platelets: 143 10*3/uL — ABNORMAL LOW (ref 150–450)
RBC: 4.2 x10E6/uL (ref 4.14–5.80)
RDW: 13.3 % (ref 11.6–15.4)
WBC: 8.6 10*3/uL (ref 3.4–10.8)

## 2021-07-26 LAB — TSH: TSH: 4.31 u[IU]/mL (ref 0.450–4.500)

## 2021-07-26 LAB — T4, FREE: Free T4: 1.14 ng/dL (ref 0.82–1.77)

## 2021-08-02 DIAGNOSIS — H2512 Age-related nuclear cataract, left eye: Secondary | ICD-10-CM | POA: Diagnosis not present

## 2021-08-08 ENCOUNTER — Encounter: Payer: Self-pay | Admitting: Ophthalmology

## 2021-08-11 ENCOUNTER — Other Ambulatory Visit: Payer: Self-pay | Admitting: Family Medicine

## 2021-08-11 DIAGNOSIS — E039 Hypothyroidism, unspecified: Secondary | ICD-10-CM

## 2021-08-11 NOTE — Discharge Instructions (Signed)

## 2021-08-16 ENCOUNTER — Encounter
Admission: RE | Disposition: A | Discharge status: Home / Self Care | Payer: Self-pay | Source: Home / Self Care | Attending: Ophthalmology

## 2021-08-16 ENCOUNTER — Ambulatory Visit: Payer: PPO | Admitting: Anesthesiology

## 2021-08-16 ENCOUNTER — Encounter: Payer: Self-pay | Admitting: Ophthalmology

## 2021-08-16 ENCOUNTER — Ambulatory Visit
Admission: RE | Admit: 2021-08-16 | Discharge: 2021-08-16 | Disposition: A | Discharge status: Home / Self Care | Payer: PPO | Attending: Ophthalmology | Admitting: Ophthalmology

## 2021-08-16 ENCOUNTER — Other Ambulatory Visit: Payer: Self-pay

## 2021-08-16 DIAGNOSIS — H2512 Age-related nuclear cataract, left eye: Secondary | ICD-10-CM | POA: Insufficient documentation

## 2021-08-16 DIAGNOSIS — Z791 Long term (current) use of non-steroidal anti-inflammatories (NSAID): Secondary | ICD-10-CM | POA: Diagnosis not present

## 2021-08-16 DIAGNOSIS — I509 Heart failure, unspecified: Secondary | ICD-10-CM | POA: Insufficient documentation

## 2021-08-16 DIAGNOSIS — Z79890 Hormone replacement therapy (postmenopausal): Secondary | ICD-10-CM | POA: Insufficient documentation

## 2021-08-16 DIAGNOSIS — K219 Gastro-esophageal reflux disease without esophagitis: Secondary | ICD-10-CM | POA: Insufficient documentation

## 2021-08-16 DIAGNOSIS — I110 Hypertensive heart disease with heart failure: Secondary | ICD-10-CM | POA: Diagnosis not present

## 2021-08-16 DIAGNOSIS — H25812 Combined forms of age-related cataract, left eye: Secondary | ICD-10-CM | POA: Diagnosis not present

## 2021-08-16 DIAGNOSIS — Z86711 Personal history of pulmonary embolism: Secondary | ICD-10-CM | POA: Diagnosis not present

## 2021-08-16 DIAGNOSIS — Z79899 Other long term (current) drug therapy: Secondary | ICD-10-CM | POA: Diagnosis not present

## 2021-08-16 DIAGNOSIS — I2510 Atherosclerotic heart disease of native coronary artery without angina pectoris: Secondary | ICD-10-CM | POA: Insufficient documentation

## 2021-08-16 HISTORY — DX: Gastro-esophageal reflux disease without esophagitis: K21.9

## 2021-08-16 HISTORY — PX: CATARACT EXTRACTION W/PHACO: SHX586

## 2021-08-16 SURGERY — PHACOEMULSIFICATION, CATARACT, WITH IOL INSERTION
Anesthesia: Monitor Anesthesia Care | Site: Eye | Laterality: Left

## 2021-08-16 MED ORDER — ACETAMINOPHEN 325 MG PO TABS: 325.0000 mg | ORAL_TABLET | ORAL | Status: DC | PRN

## 2021-08-16 MED ORDER — FENTANYL CITRATE (PF) 100 MCG/2ML IJ SOLN
INTRAMUSCULAR | Status: DC | PRN
  Administered 2021-08-16: 50 ug via INTRAVENOUS

## 2021-08-16 MED ORDER — MIDAZOLAM HCL 2 MG/2ML IJ SOLN
INTRAMUSCULAR | Status: DC | PRN
  Administered 2021-08-16: 2 mg via INTRAVENOUS

## 2021-08-16 MED ORDER — SIGHTPATH DOSE#1 BSS IO SOLN
INTRAOCULAR | Status: DC | PRN
  Administered 2021-08-16: 15 mL

## 2021-08-16 MED ORDER — LIDOCAINE HCL (PF) 2 % IJ SOLN
INTRAMUSCULAR | Status: DC | PRN
  Administered 2021-08-16: 1 mL via INTRAMUSCULAR

## 2021-08-16 MED ORDER — LACTATED RINGERS IV SOLN: INTRAVENOUS | Status: DC

## 2021-08-16 MED ORDER — MOXIFLOXACIN HCL 0.5 % OP SOLN
OPHTHALMIC | Status: DC | PRN
  Administered 2021-08-16: 0.2 mL via OPHTHALMIC

## 2021-08-16 MED ORDER — ARMC OPHTHALMIC DILATING DROPS
1.0000 "application " | OPHTHALMIC | Status: DC | PRN
  Administered 2021-08-16 (×3): 1 via OPHTHALMIC

## 2021-08-16 MED ORDER — SIGHTPATH DOSE#1 BSS IO SOLN
INTRAOCULAR | Status: DC | PRN
  Administered 2021-08-16: 64 mL via OPHTHALMIC

## 2021-08-16 MED ORDER — BRIMONIDINE TARTRATE-TIMOLOL 0.2-0.5 % OP SOLN
OPHTHALMIC | Status: DC | PRN
  Administered 2021-08-16: 1 [drp] via OPHTHALMIC

## 2021-08-16 MED ORDER — ACETAMINOPHEN 160 MG/5ML PO SOLN: 325.0000 mg | ORAL | Status: DC | PRN

## 2021-08-16 MED ORDER — TETRACAINE HCL 0.5 % OP SOLN
1.0000 [drp] | OPHTHALMIC | Status: DC | PRN
  Administered 2021-08-16 (×3): 1 [drp] via OPHTHALMIC

## 2021-08-16 MED ORDER — SIGHTPATH DOSE#1 NA CHONDROIT SULF-NA HYALURON 40-17 MG/ML IO SOLN
INTRAOCULAR | Status: DC | PRN
  Administered 2021-08-16: 1 mL via INTRAOCULAR

## 2021-08-16 SURGICAL SUPPLY — 10 items
CATARACT SUITE SIGHTPATH (MISCELLANEOUS) ×2 IMPLANT
FEE CATARACT SUITE SIGHTPATH (MISCELLANEOUS) ×1 IMPLANT
GLOVE SURG ENC TEXT LTX SZ8 (GLOVE) ×2 IMPLANT
GLOVE SURG TRIUMPH 8.0 PF LTX (GLOVE) ×2 IMPLANT
LENS IOL ACRYSOF IQ 21.5 (Intraocular Lens) ×1 IMPLANT
NDL FILTER BLUNT 18X1 1/2 (NEEDLE) ×1 IMPLANT
NEEDLE FILTER BLUNT 18X 1/2SAF (NEEDLE) ×1
NEEDLE FILTER BLUNT 18X1 1/2 (NEEDLE) ×1 IMPLANT
SYR 3ML LL SCALE MARK (SYRINGE) ×2 IMPLANT
WATER STERILE IRR 250ML POUR (IV SOLUTION) ×2 IMPLANT

## 2021-08-16 NOTE — Anesthesia Preprocedure Evaluation (Signed)
Anesthesia Evaluation  ?Patient identified by MRN, date of birth, ID band ?Patient awake ? ? ? ?Reviewed: ?Allergy & Precautions, H&P , NPO status , Patient's Chart, lab work & pertinent test results, reviewed documented beta blocker date and time  ? ?Airway ?Mallampati: II ? ?TM Distance: >3 FB ?Neck ROM: full ? ? ? Dental ?no notable dental hx. ? ?  ?Pulmonary ?neg pulmonary ROS,  ?  ?Pulmonary exam normal ?breath sounds clear to auscultation ? ? ? ? ? ? Cardiovascular ?Exercise Tolerance: Good ?hypertension, + CAD and +CHF  ?Normal cardiovascular exam ?Rhythm:regular Rate:Normal ? ? ?  ?Neuro/Psych ?negative neurological ROS ? negative psych ROS  ? GI/Hepatic ?Neg liver ROS, PUD, GERD  ,  ?Endo/Other  ?negative endocrine ROS ? Renal/GU ?negative Renal ROS  ?negative genitourinary ?  ?Musculoskeletal ? ? Abdominal ?  ?Peds ? Hematology ?negative hematology ROS ?(+)   ?Anesthesia Other Findings ? ? Reproductive/Obstetrics ?negative OB ROS ? ?  ? ? ? ? ? ? ? ? ? ? ? ? ? ?  ?  ? ? ? ? ? ? ? ? ?Anesthesia Physical ?Anesthesia Plan ? ?ASA: 2 ? ?Anesthesia Plan: MAC  ? ?Post-op Pain Management:   ? ?Induction:  ? ?PONV Risk Score and Plan:  ? ?Airway Management Planned:  ? ?Additional Equipment:  ? ?Intra-op Plan:  ? ?Post-operative Plan:  ? ?Informed Consent: I have reviewed the patients History and Physical, chart, labs and discussed the procedure including the risks, benefits and alternatives for the proposed anesthesia with the patient or authorized representative who has indicated his/her understanding and acceptance.  ? ? ? ?Dental Advisory Given ? ?Plan Discussed with: CRNA and Anesthesiologist ? ?Anesthesia Plan Comments:   ? ? ? ? ? ? ?Anesthesia Quick Evaluation ? ?

## 2021-08-16 NOTE — H&P (Signed)
[place]  ? ?Primary Care Physician:  Birdie Sons, MD ?Ophthalmologist: Dr. George Ina ? ?Pre-Procedure History & Physical: ?HPI:  Darius Parker is a 76 y.o. male here for cataract surgery. ?  ?Past Medical History:  ?Diagnosis Date  ? Arthritis   ? Bursitis   ? CAP (community acquired pneumonia) 02/08/2018  ? Dysrhythmia   ? GERD (gastroesophageal reflux disease)   ? Heart murmur   ? Hepatitis   ? Pulmonary embolism ([place]) 02/08/2018  ? Stomach ulcer   ? ? ?Past Surgical History:  ?Procedure Laterality Date  ? BACK SURGERY    ? CARDIAC CATHETERIZATION    ? CATARACT EXTRACTION W/PHACO Right 03/01/2016  ? Procedure: CATARACT EXTRACTION PHACO AND INTRAOCULAR LENS PLACEMENT (IOC);  Surgeon: Estill Cotta, MD;  Location: [facility] ORS;  Service: Ophthalmology;  Laterality: Right;  [place] 1.33 ?AP% 21.6 ?CDE 37.45 ?Fluid Pack Lot # [id number]  ? COLONOSCOPY W/ POLYPECTOMY    ? TONSILLECTOMY    ? ? ?Prior to Admission medications   ?Medication Sig Start Date End Date Taking? Authorizing Provider  ?aspirin EC 81 MG tablet Take 162 mg by mouth daily. Swallow whole.   Yes [provider]  ?atorvastatin (LIPITOR) 10 MG tablet Take 10 mg at bedtime by mouth. 01/21/17  Yes [provider]  ?carvedilol (COREG) 12.5 MG tablet Take 12.5 mg by mouth 2 (two) times daily with a meal.   Yes [provider]  ?enalapril (VASOTEC) 10 MG tablet Take 10 mg by mouth 2 (two) times daily.   Yes [provider]  ?famotidine (PEPCID) 20 MG tablet TAKE 1 TABLET BY MOUTH TWICE A DAY 02/07/21  Yes Birdie Sons, MD  ?furosemide (LASIX) 20 MG tablet Take 20 mg by mouth daily.   Yes [provider]  ?levothyroxine (SYNTHROID) 25 MCG tablet TAKE 1 TABLET BY MOUTH EVERY DAY 08/11/21  Yes Birdie Sons, MD  ?meloxicam (MOBIC) 15 MG tablet Take 15 mg by mouth daily. 04/09/21  Yes [provider]  ?omega-3 acid ethyl esters (LOVAZA) 1 g capsule Take 1 capsule (1 g total) by mouth 2 (two) times  daily. 04/12/18  Yes Birdie Sons, MD  ?tamsulosin (FLOMAX) 0.4 MG CAPS capsule Take 1 capsule (0.4 mg total) by mouth daily. 03/15/20  Yes Fisher, Linden Dolin, PA-C  ?terbinafine (LAMISIL) 250 MG tablet Take 2 tablets (500 mg total) by mouth daily. Take for 1 week out of every 4 weeks ?Patient not taking: Reported on 07/25/2021 04/28/20   Birdie Sons, MD  ? ? ?Allergies as of 07/11/2021 - Review Complete 06/15/2021  ?Allergen Reaction Noted  ? Tetanus toxoids Swelling 07/20/2015  ? ? ?Family History  ?Problem Relation Age of Onset  ? Diabetes Brother   ? ? ?Social History  ? ?Socioeconomic History  ? Marital status: Widowed  ?  Spouse name: Not on file  ? Number of children: 1  ? Years of education: Not on file  ? Highest education level: Associate degree: occupational, Hotel manager, or vocational program  ?Occupational History  ? Occupation: retired  ?  Comment: mows 4 lawns  ?Tobacco Use  ? Smoking status: Never  ? Smokeless tobacco: Never  ?Vaping Use  ? Vaping Use: Never used  ?Substance and Sexual Activity  ? Alcohol use: No  ? Drug use: Never  ? Sexual activity: Yes  ?  Birth control/protection: None  ?Other Topics Concern  ? Not on file  ?Social History Narrative  ? Not on  file  ? ?Social Determinants of Health  ? ?Financial Resource Strain: Not on file  ?Food Insecurity: Not on file  ?Transportation Needs: Not on file  ?Physical Activity: Not on file  ?Stress: Not on file  ?Social Connections: Not on file  ?Intimate Partner Violence: Not on file  ? ? ?Review of Systems: ?See HPI, otherwise negative ROS ? ?Physical Exam: ?BP 133/81   Pulse 74   Temp 97.7 ?F (36.5 ?C) (Temporal)   Resp 16   Ht '6\' 2"'$  (1.88 m)   Wt 88.9 kg   SpO2 100%   BMI 25.16 kg/m?  ?General:   Alert, cooperative in NAD ?Head:  Normocephalic and atraumatic. ?Respiratory:  Normal work of breathing. ?Cardiovascular:  RRR ? ?Impression/Plan: ?Darius Parker is here for cataract surgery. ? ?Risks, benefits, limitations, and alternatives  regarding cataract surgery have been reviewed with the patient.  Questions have been answered.  All parties agreeable. ? ? ?Birder Robson, MD  08/16/2021, 10:08 AM ? ? ?

## 2021-08-16 NOTE — Anesthesia Procedure Notes (Signed)
Procedure Name: [place] ?Date/Time: 08/16/2021 10:17 AM ?Performed by: Cameron Ali, CRNA ?Pre-anesthesia Checklist: Patient identified, Emergency Drugs available, Suction available, Timeout performed and Patient being monitored ?Patient Re-evaluated:Patient Re-evaluated prior to induction ?Oxygen Delivery Method: Nasal cannula ?Placement Confirmation: positive ETCO2 ? ? ? ? ?

## 2021-08-16 NOTE — Anesthesia Postprocedure Evaluation (Signed)
Anesthesia Post Note ? ?Patient: Darius Parker ? ?Procedure(s) Performed: CATARACT EXTRACTION PHACO AND INTRAOCULAR LENS PLACEMENT (IOC) LEFT 10.06 00:58.7 (Left: Eye) ? ? ?  ?Patient location during evaluation: PACU ?Anesthesia Type: MAC ?Level of consciousness: awake and alert ?Pain management: pain level controlled ?Vital Signs Assessment: post-procedure vital signs reviewed and stable ?Respiratory status: spontaneous breathing, nonlabored ventilation, respiratory function stable and patient connected to nasal cannula oxygen ?Cardiovascular status: stable and blood pressure returned to baseline ?Postop Assessment: no apparent nausea or vomiting ?Anesthetic complications: no ? ? ?No notable events documented. ? ?Trecia Rogers ? ? ? ? ? ?

## 2021-08-16 NOTE — Op Note (Signed)
PREOPERATIVE DIAGNOSIS:  Nuclear sclerotic cataract of the left eye. ?  ?POSTOPERATIVE DIAGNOSIS:  Nuclear sclerotic cataract of the left eye. ?  ?OPERATIVE PROCEDURE:ORPROCALL@ ?  ?SURGEON:  Birder Robson, MD. ?  ?ANESTHESIA: ? ?Anesthesiologist: Rochel Brome, MD ?CRNA: Jeannene Patella, CRNA ? ?1.      Managed anesthesia care. ?2.     0.31m of Shugarcaine was instilled following the paracentesis ?  ?COMPLICATIONS:  None. ?  ?TECHNIQUE:   Stop and chop ?  ?DESCRIPTION OF PROCEDURE:  The patient was examined and consented in the preoperative holding area where the aforementioned topical anesthesia was applied to the left eye and then brought back to the Operating Room where the left eye was prepped and draped in the usual sterile ophthalmic fashion and a lid speculum was placed. A paracentesis was created with the side port blade and the anterior chamber was filled with viscoelastic. A near clear corneal incision was performed with the steel keratome. A continuous curvilinear capsulorrhexis was performed with a cystotome followed by the capsulorrhexis forceps. Hydrodissection and hydrodelineation were carried out with BSS on a blunt cannula. The lens was removed in a stop and chop  technique and the remaining cortical material was removed with the irrigation-aspiration handpiece. The capsular bag was inflated with viscoelastic and the Technis [id number] lens was placed in the capsular bag without complication. The remaining viscoelastic was removed from the eye with the irrigation-aspiration handpiece. The wounds were hydrated. The anterior chamber was flushed with BSS and the eye was inflated to physiologic pressure. 0.160mVigamox was placed in the anterior chamber. The wounds were found to be water tight. The eye was dressed with Combigan. The patient was given protective glasses to wear throughout the day and a shield with which to sleep tonight. The patient was also given drops with which to begin a drop  regimen today and will follow-up with me in one day. ?Implant Name Type Inv. Item Serial No. Manufacturer Lot No. LRB No. Used Action  ?LENS IOL ACRYSOF IQ 21.5 - [id number] Lens LENS IOL ACRYSOF IQ 21.5 [id number]  Left 1 Implanted  ?  ?Procedure(s) with comments: ?CATARACT EXTRACTION PHACO AND INTRAOCULAR LENS PLACEMENT (IOC) LEFT 10.06 00:58.7 (Left) - LEAVE LAST ? ?Electronically signed: WiBirder Robson/28/2023 10:30 AM ? ?

## 2021-08-16 NOTE — Transfer of Care (Signed)
Immediate Anesthesia Transfer of Care Note ? ?Patient: Darius Parker ? ?Procedure(s) Performed: CATARACT EXTRACTION PHACO AND INTRAOCULAR LENS PLACEMENT (IOC) LEFT 10.06 00:58.7 (Left: Eye) ? ?Patient Location: PACU ? ?Anesthesia Type: MAC ? ?Level of Consciousness: awake, alert  and patient cooperative ? ?Airway and Oxygen Therapy: Patient Spontanous Breathing and Patient connected to supplemental oxygen ? ?Post-op Assessment: Post-op Vital signs reviewed, Patient's Cardiovascular Status Stable, Respiratory Function Stable, Patent Airway and No signs of Nausea or vomiting ? ?Post-op Vital Signs: Reviewed and stable ? ?Complications: No notable events documented. ? ?

## 2021-08-17 ENCOUNTER — Encounter: Payer: Self-pay | Admitting: Ophthalmology

## 2021-09-01 DIAGNOSIS — I10 Essential (primary) hypertension: Secondary | ICD-10-CM | POA: Diagnosis not present

## 2021-09-01 DIAGNOSIS — R002 Palpitations: Secondary | ICD-10-CM | POA: Diagnosis not present

## 2021-09-01 DIAGNOSIS — I2510 Atherosclerotic heart disease of native coronary artery without angina pectoris: Secondary | ICD-10-CM | POA: Diagnosis not present

## 2021-09-01 DIAGNOSIS — I2782 Chronic pulmonary embolism: Secondary | ICD-10-CM | POA: Diagnosis not present

## 2021-09-01 DIAGNOSIS — E782 Mixed hyperlipidemia: Secondary | ICD-10-CM | POA: Diagnosis not present

## 2021-09-01 DIAGNOSIS — I5022 Chronic systolic (congestive) heart failure: Secondary | ICD-10-CM | POA: Diagnosis not present

## 2021-10-04 ENCOUNTER — Ambulatory Visit: Payer: PPO | Admitting: Gastroenterology

## 2021-10-11 ENCOUNTER — Encounter: Payer: Self-pay | Admitting: Gastroenterology

## 2021-10-11 ENCOUNTER — Ambulatory Visit (INDEPENDENT_AMBULATORY_CARE_PROVIDER_SITE_OTHER): Payer: PPO | Admitting: Gastroenterology

## 2021-10-11 VITALS — BP 111/69 | HR 71 | Temp 97.8°F | Ht 74.0 in | Wt 198.0 lb

## 2021-10-11 DIAGNOSIS — R1310 Dysphagia, unspecified: Secondary | ICD-10-CM

## 2021-10-11 NOTE — Progress Notes (Signed)
Gastroenterology Consultation  Referring Provider:     Birdie Sons, MD Primary Care Physician:  Birdie Sons, MD Primary Gastroenterologist:  Dr. Allen Norris     Reason for Consultation:     Dysphagia        HPI:   Darius Parker is a 76 y.o. y/o male referred for consultation & management of Dysphagia by Dr. Caryn Section, Kirstie Peri, MD.  This patient comes in today with a history of dysphagia.  The patient states that the dysphagia is more to solids and it is to liquids.  The patient reports that food gets stuck in his esophagus and then he has to sit down and try to relax and it usually passes on its own.  The patient denies any unexplained weight loss fevers chills nausea vomiting black stools or bloody stools.  He does report that he has never had the food stick to a point where he cannot get it to go down.  The patient had a colonoscopy by me back in 2008 and had a repeat colonoscopy by another physician in 2014.  The patient is not having any lower GI symptoms.   Past Medical History:  Diagnosis Date   Arthritis    Bursitis    CAP (community acquired pneumonia) 02/08/2018   Dysrhythmia    GERD (gastroesophageal reflux disease)    Heart murmur    Hepatitis    Pulmonary embolism ([place]) 02/08/2018   Stomach ulcer     Past Surgical History:  Procedure Laterality Date   BACK SURGERY     CARDIAC CATHETERIZATION     CATARACT EXTRACTION W/PHACO Right 03/01/2016   Procedure: CATARACT EXTRACTION PHACO AND INTRAOCULAR LENS PLACEMENT ([place]);  Surgeon: Estill Cotta, MD;  Location: [facility] ORS;  Service: Ophthalmology;  Laterality: Right;  [place] 1.33 AP% 21.6 CDE 37.45 Fluid Pack Lot # [id number]   CATARACT EXTRACTION W/PHACO Left 08/16/2021   Procedure: CATARACT EXTRACTION PHACO AND INTRAOCULAR LENS PLACEMENT ([facility]) LEFT 10.06 00:58.7;  Surgeon: Birder Robson, MD;  Location: [place];  Service: Ophthalmology;  Laterality: Left;  LEAVE LAST   COLONOSCOPY W/ POLYPECTOMY      TONSILLECTOMY      Prior to Admission medications   Medication Sig Start Date End Date Taking? Authorizing Provider  aspirin EC 81 MG tablet Take 162 mg by mouth daily. Swallow whole.   Yes [provider]  atorvastatin (LIPITOR) 10 MG tablet Take 10 mg at bedtime by mouth. 01/21/17  Yes [provider]  carvedilol (COREG) 12.5 MG tablet Take 12.5 mg by mouth 2 (two) times daily with a meal.   Yes [provider]  enalapril (VASOTEC) 10 MG tablet Take 10 mg by mouth 2 (two) times daily.   Yes [provider]  famotidine (PEPCID) 20 MG tablet TAKE 1 TABLET BY MOUTH TWICE A DAY 02/07/21  Yes Birdie Sons, MD  furosemide (LASIX) 20 MG tablet Take 20 mg by mouth daily.   Yes [provider]  levothyroxine (SYNTHROID) 25 MCG tablet TAKE 1 TABLET BY MOUTH EVERY DAY 08/11/21  Yes Birdie Sons, MD  meloxicam (MOBIC) 15 MG tablet Take 15 mg by mouth daily. 04/09/21  Yes [provider]  omega-3 acid ethyl esters (LOVAZA) 1 g capsule Take 1 capsule (1 g total) by mouth 2 (two) times daily. 04/12/18  Yes Birdie Sons, MD  tamsulosin (FLOMAX) 0.4 MG CAPS capsule Take 1 capsule (0.4 mg total) by mouth daily. 03/15/20  Yes Fisher,  Linden Dolin, PA-C    Family History  Problem Relation Age of Onset   Diabetes Brother      Social History   Tobacco Use   Smoking status: Never   Smokeless tobacco: Never  Vaping Use   Vaping Use: Never used  Substance Use Topics   Alcohol use: No   Drug use: Never    Allergies as of 10/11/2021 - Review Complete 10/11/2021  Allergen Reaction Noted   Tetanus toxoids Swelling 07/20/2015    Review of Systems:    All systems reviewed and negative except where noted in HPI.   Physical Exam:  BP 111/69   Pulse 71   Temp 97.8 F (36.6 C) (Oral)   Ht '6\' 2"'$  (1.88 m)   Wt 198 lb (89.8 kg)   BMI 25.42 kg/m  No LMP for male patient. General:   Alert,  Well-developed, well-nourished, pleasant and cooperative  in NAD Head:  Normocephalic and atraumatic. Eyes:  Sclera clear, no icterus.   Conjunctiva pink. Ears:  Normal auditory acuity. Neck:  Supple; no masses or thyromegaly. Lungs:  Respirations even and unlabored.  Clear throughout to auscultation.   No wheezes, crackles, or rhonchi. No acute distress. Heart:  Regular rate and rhythm; no murmurs, clicks, rubs, or gallops. Abdomen:  Normal bowel sounds.  No bruits.  Soft, non-tender and non-distended without masses, hepatosplenomegaly or hernias noted.  No guarding or rebound tenderness.  Negative Carnett sign.   Rectal:  Deferred.  Pulses:  Normal pulses noted. Extremities:  No clubbing or edema.  No cyanosis. Neurologic:  Alert and oriented x3;  grossly normal neurologically. Skin:  Intact without significant lesions or rashes.  No jaundice. Lymph Nodes:  No significant cervical adenopathy. Psych:  Alert and cooperative. Normal mood and affect.  Imaging Studies: No results found.  Assessment and Plan:   Darius Parker is a 76 y.o. y/o male who comes in today with a history of dysphagia.  The patient reports the dysphagia be more to solids and it is to liquids.  The patient also reports that the dysphagia usually results in food going down by itself without the need for him to go to the emergency room.  The patient will be set up for an EGD to rule out any narrowing or stricture.  It is less likely that this is due to a esophageal spasm since it only happens when he eats and only with bulky foods.  The patient has been explained the plan and agrees with it.    Darius Lame, MD. Marval Regal    Note: This dictation was prepared with Dragon dictation along with smaller phrase technology. Any transcriptional errors that result from this process are unintentional.

## 2021-10-25 ENCOUNTER — Ambulatory Visit: Payer: PPO | Admitting: Anesthesiology

## 2021-10-25 ENCOUNTER — Encounter
Admission: RE | Disposition: A | Discharge status: Home / Self Care | Payer: Self-pay | Source: Home / Self Care | Attending: Gastroenterology

## 2021-10-25 ENCOUNTER — Other Ambulatory Visit: Payer: Self-pay

## 2021-10-25 ENCOUNTER — Ambulatory Visit
Admission: RE | Admit: 2021-10-25 | Discharge: 2021-10-25 | Disposition: A | Discharge status: Home / Self Care | Payer: PPO | Attending: Gastroenterology | Admitting: Gastroenterology

## 2021-10-25 ENCOUNTER — Encounter: Payer: Self-pay | Admitting: Gastroenterology

## 2021-10-25 DIAGNOSIS — I2510 Atherosclerotic heart disease of native coronary artery without angina pectoris: Secondary | ICD-10-CM | POA: Diagnosis not present

## 2021-10-25 DIAGNOSIS — Z86711 Personal history of pulmonary embolism: Secondary | ICD-10-CM | POA: Insufficient documentation

## 2021-10-25 DIAGNOSIS — I739 Peripheral vascular disease, unspecified: Secondary | ICD-10-CM | POA: Diagnosis not present

## 2021-10-25 DIAGNOSIS — R1310 Dysphagia, unspecified: Secondary | ICD-10-CM | POA: Diagnosis not present

## 2021-10-25 DIAGNOSIS — I110 Hypertensive heart disease with heart failure: Secondary | ICD-10-CM | POA: Insufficient documentation

## 2021-10-25 DIAGNOSIS — Z8711 Personal history of peptic ulcer disease: Secondary | ICD-10-CM | POA: Insufficient documentation

## 2021-10-25 DIAGNOSIS — K222 Esophageal obstruction: Secondary | ICD-10-CM | POA: Diagnosis not present

## 2021-10-25 DIAGNOSIS — E782 Mixed hyperlipidemia: Secondary | ICD-10-CM | POA: Diagnosis not present

## 2021-10-25 DIAGNOSIS — I509 Heart failure, unspecified: Secondary | ICD-10-CM | POA: Insufficient documentation

## 2021-10-25 DIAGNOSIS — K759 Inflammatory liver disease, unspecified: Secondary | ICD-10-CM | POA: Insufficient documentation

## 2021-10-25 DIAGNOSIS — K219 Gastro-esophageal reflux disease without esophagitis: Secondary | ICD-10-CM | POA: Diagnosis not present

## 2021-10-25 HISTORY — PX: ESOPHAGOGASTRODUODENOSCOPY (EGD) WITH PROPOFOL: SHX5813

## 2021-10-25 SURGERY — ESOPHAGOGASTRODUODENOSCOPY (EGD) WITH PROPOFOL
Anesthesia: General

## 2021-10-25 MED ORDER — LIDOCAINE HCL (CARDIAC) PF 100 MG/5ML IV SOSY
PREFILLED_SYRINGE | INTRAVENOUS | Status: DC | PRN
  Administered 2021-10-25: 60 mg via INTRAVENOUS

## 2021-10-25 MED ORDER — EPHEDRINE SULFATE (PRESSORS) 50 MG/ML IJ SOLN
INTRAMUSCULAR | Status: DC | PRN
  Administered 2021-10-25: 10 mg via INTRAVENOUS

## 2021-10-25 MED ORDER — PROPOFOL 10 MG/ML IV BOLUS
INTRAVENOUS | Status: DC | PRN
  Administered 2021-10-25: 30 mg via INTRAVENOUS
  Administered 2021-10-25: 60 mg via INTRAVENOUS
  Administered 2021-10-25: 20 mg via INTRAVENOUS

## 2021-10-25 MED ORDER — SODIUM CHLORIDE 0.9 % IV SOLN: INTRAVENOUS | Status: DC

## 2021-10-25 NOTE — Op Note (Signed)
[facility] Gastroenterology Patient Name: Darius Parker Procedure Date: 10/25/2021 9:32 AM MRN: [id number] Account #: [id number] Date of Birth: [birth year] Admit Type: Outpatient Age: 76 Room: [facility] ENDO ROOM 4 Gender: Male Note Status: Finalized Instrument Name: Upper Endoscope [id number] Procedure:             Upper GI endoscopy Indications:           Dysphagia Providers:             Lucilla Lame MD, MD Referring MD:          Kirstie Peri. Caryn Section, MD (Referring MD) Medicines:             Propofol per Anesthesia Complications:         No immediate complications. Procedure:             Pre-Anesthesia Assessment:                        - Prior to the procedure, a History and Physical was                         performed, and patient medications and allergies were                         reviewed. The patient's tolerance of previous                         anesthesia was also reviewed. The risks and benefits                         of the procedure and the sedation options and risks                         were discussed with the patient. All questions were                         answered, and informed consent was obtained. Prior                         Anticoagulants: The patient has taken no previous                         anticoagulant or antiplatelet agents. ASA Grade                         Assessment: II - A patient with mild systemic disease.                         After reviewing the risks and benefits, the patient                         was deemed in satisfactory condition to undergo the                         procedure.                        After obtaining informed consent, the endoscope was  passed under direct vision. Throughout the procedure,                         the patient's blood pressure, pulse, and oxygen                         saturations were monitored continuously. The                         Endosonoscope was introduced  through the mouth, and                         advanced to the second part of duodenum. The upper GI                         endoscopy was accomplished without difficulty. The                         patient tolerated the procedure well. Findings:      One benign-appearing, intrinsic moderate stenosis was found at the       gastroesophageal junction. The stenosis was traversed. A TTS dilator was       passed through the scope. Dilation with a 12-13.5-15 mm balloon dilator       was performed to 15 mm.      The stomach was normal.      The examined duodenum was normal. Impression:            - Benign-appearing esophageal stenosis. Dilated.                        - Normal stomach.                        - Normal examined duodenum.                        - No specimens collected. Recommendation:        - Discharge patient to home.                        - Resume previous diet.                        - Continue present medications.                        - Repeat upper endoscopy PRN for retreatment. Procedure Code(s):     --- Professional ---                        [phone], Esophagogastroduodenoscopy, flexible,                         transoral; with transendoscopic balloon dilation of                         esophagus (less than 30 mm diameter) Diagnosis Code(s):     --- Professional ---                        R13.10, Dysphagia, unspecified  K22.2, Esophageal obstruction CPT copyright 2019 American Medical Association. All rights reserved. The codes documented in this report are preliminary and upon coder review may  be revised to meet current compliance requirements. Lucilla Lame MD, MD 10/25/2021 9:49:35 AM This report has been signed electronically. Number of Addenda: 0 Note Initiated On: 10/25/2021 9:32 AM Estimated Blood Loss:  Estimated blood loss: none.      [facility]

## 2021-10-25 NOTE — H&P (Signed)
Lucilla Lame, MD [address] [place], [place] [address] Phone:[phone] Fax : [phone]  Primary Care Physician:  Birdie Sons, MD Primary Gastroenterologist:  Dr. Allen Norris  Pre-Procedure History & Physical: HPI:  Darius Parker is a 76 y.o. male is here for an endoscopy.   Past Medical History:  Diagnosis Date   Arthritis    Bursitis    CAP (community acquired pneumonia) 02/08/2018   Dysrhythmia    GERD (gastroesophageal reflux disease)    Heart murmur    Hepatitis    Pulmonary embolism ([place]) 02/08/2018   Stomach ulcer     Past Surgical History:  Procedure Laterality Date   BACK SURGERY     CARDIAC CATHETERIZATION     CATARACT EXTRACTION W/PHACO Right 03/01/2016   Procedure: CATARACT EXTRACTION PHACO AND INTRAOCULAR LENS PLACEMENT ([place]);  Surgeon: Estill Cotta, MD;  Location: [facility] ORS;  Service: Ophthalmology;  Laterality: Right;  [place] 1.33 AP% 21.6 CDE 37.45 Fluid Pack Lot # [id number]   CATARACT EXTRACTION W/PHACO Left 08/16/2021   Procedure: CATARACT EXTRACTION PHACO AND INTRAOCULAR LENS PLACEMENT ([facility]) LEFT 10.06 00:58.7;  Surgeon: Birder Robson, MD;  Location: [place];  Service: Ophthalmology;  Laterality: Left;  LEAVE LAST   COLONOSCOPY W/ POLYPECTOMY     TONSILLECTOMY      Prior to Admission medications   Medication Sig Start Date End Date Taking? Authorizing Provider  aspirin EC 81 MG tablet Take 162 mg by mouth daily. Swallow whole.   Yes [provider]  carvedilol (COREG) 12.5 MG tablet Take 12.5 mg by mouth 2 (two) times daily with a meal.   Yes [provider]  enalapril (VASOTEC) 10 MG tablet Take 10 mg by mouth 2 (two) times daily.   Yes [provider]  famotidine (PEPCID) 20 MG tablet TAKE 1 TABLET BY MOUTH TWICE A DAY 02/07/21  Yes Birdie Sons, MD  furosemide (LASIX) 20 MG tablet Take 20 mg by mouth daily.   Yes [provider]  levothyroxine (SYNTHROID) 25 MCG tablet TAKE  1 TABLET BY MOUTH EVERY DAY 08/11/21  Yes Birdie Sons, MD  meloxicam (MOBIC) 15 MG tablet Take 15 mg by mouth daily. 04/09/21  Yes [provider]  omega-3 acid ethyl esters (LOVAZA) 1 g capsule Take 1 capsule (1 g total) by mouth 2 (two) times daily. 04/12/18  Yes Birdie Sons, MD  tamsulosin (FLOMAX) 0.4 MG CAPS capsule Take 1 capsule (0.4 mg total) by mouth daily. 03/15/20  Yes Fisher, Linden Dolin, PA-C  atorvastatin (LIPITOR) 10 MG tablet Take 10 mg at bedtime by mouth. 01/21/17   [provider]    Allergies as of 10/11/2021 - Review Complete 10/11/2021  Allergen Reaction Noted   Tetanus toxoids Swelling 07/20/2015    Family History  Problem Relation Age of Onset   Diabetes Brother     Social History   Socioeconomic History   Marital status: Widowed    Spouse name: Not on file   Number of children: 1   Years of education: Not on file   Highest education level: Associate degree: occupational, Hotel manager, or vocational program  Occupational History   Occupation: retired    Comment: mows 4 lawns  Tobacco Use   Smoking status: Never   Smokeless tobacco: Never  Vaping Use   Vaping Use: Never used  Substance and Sexual Activity   Alcohol use: No   Drug use: Never   Sexual activity: Yes    Birth  control/protection: None  Other Topics Concern   Not on file  Social History Narrative   Not on file   Social Determinants of Health   Financial Resource Strain: Not on file  Food Insecurity: Not on file  Transportation Needs: Not on file  Physical Activity: Not on file  Stress: Not on file  Social Connections: Not on file  Intimate Partner Violence: Not on file    Review of Systems: See HPI, otherwise negative ROS  Physical Exam: BP (!) 145/82   Pulse 78   Temp (!) 97.2 F (36.2 C) (Temporal)   Resp 18   Ht '6\' 2"'$  (1.88 m)   Wt 86.2 kg   SpO2 100%   BMI 24.39 kg/m  General:   Alert,  pleasant and cooperative in NAD Head:  Normocephalic and  atraumatic. Neck:  Supple; no masses or thyromegaly. Lungs:  Clear throughout to auscultation.    Heart:  Regular rate and rhythm. Abdomen:  Soft, nontender and nondistended. Normal bowel sounds, without guarding, and without rebound.   Neurologic:  Alert and  oriented x4;  grossly normal neurologically.  Impression/Plan: Darius Parker is here for an endoscopy to be performed for dysphagia  Risks, benefits, limitations, and alternatives regarding  endoscopy have been reviewed with the patient.  Questions have been answered.  All parties agreeable.   Lucilla Lame, MD  10/25/2021, 9:32 AM

## 2021-10-25 NOTE — Anesthesia Preprocedure Evaluation (Signed)
Anesthesia Evaluation  Patient identified by MRN, date of birth, ID band Patient awake    Reviewed: Allergy & Precautions, H&P , NPO status , Patient's Chart, lab work & pertinent test results, reviewed documented beta blocker date and time   Airway Mallampati: III   Neck ROM: full    Dental  (+) Poor Dentition, Teeth Intact   Pulmonary pneumonia, resolved,    Pulmonary exam normal        Cardiovascular Exercise Tolerance: Poor hypertension, + CAD, + Peripheral Vascular Disease, +CHF, + PND and + DOE  Normal cardiovascular exam+ dysrhythmias + Valvular Problems/Murmurs  Rhythm:regular Rate:Normal     Neuro/Psych negative neurological ROS  negative psych ROS   GI/Hepatic PUD, GERD  Medicated,(+) Hepatitis -  Endo/Other  negative endocrine ROS  Renal/GU negative Renal ROS  negative genitourinary   Musculoskeletal   Abdominal   Peds  Hematology negative hematology ROS (+)   Anesthesia Other Findings Past Medical History: No date: Arthritis No date: Bursitis 02/08/2018: CAP (community acquired pneumonia) No date: Dysrhythmia No date: GERD (gastroesophageal reflux disease) No date: Heart murmur No date: Hepatitis 02/08/2018: Pulmonary embolism ([place]) No date: Stomach ulcer Past Surgical History: No date: BACK SURGERY No date: CARDIAC CATHETERIZATION 03/01/2016: CATARACT EXTRACTION W/PHACO; Right     Comment:  Procedure: CATARACT EXTRACTION PHACO AND INTRAOCULAR               LENS PLACEMENT ([place]);  Surgeon: Estill Cotta, MD;                Location: [facility] ORS;  Service: Ophthalmology;  Laterality:              Right;  [place] 1.33 AP% 21.6 CDE 37.45 Fluid Pack Lot #               [id number] 08/16/2021: CATARACT EXTRACTION W/PHACO; Left     Comment:  Procedure: CATARACT EXTRACTION PHACO AND INTRAOCULAR               LENS PLACEMENT ([facility]) LEFT 10.06 00:58.7;  Surgeon:               Birder Robson, MD;  Location:  [place];                Service: Ophthalmology;  Laterality: Left;  LEAVE LAST No date: COLONOSCOPY W/ POLYPECTOMY No date: TONSILLECTOMY BMI    Body Mass Index: 24.39 kg/m     Reproductive/Obstetrics negative OB ROS                             Anesthesia Physical Anesthesia Plan  ASA: 3  Anesthesia Plan: General   Post-op Pain Management:    Induction:   PONV Risk Score and Plan:   Airway Management Planned:   Additional Equipment:   Intra-op Plan:   Post-operative Plan:   Informed Consent: I have reviewed the patients History and Physical, chart, labs and discussed the procedure including the risks, benefits and alternatives for the proposed anesthesia with the patient or authorized representative who has indicated his/her understanding and acceptance.     Dental Advisory Given  Plan Discussed with: CRNA  Anesthesia Plan Comments:         Anesthesia Quick Evaluation

## 2021-10-25 NOTE — Anesthesia Postprocedure Evaluation (Signed)
Anesthesia Post Note  Patient: Darius Parker  Procedure(s) Performed: ESOPHAGOGASTRODUODENOSCOPY (EGD) WITH PROPOFOL  Patient location during evaluation: PACU Anesthesia Type: General Level of consciousness: awake and alert Pain management: pain level controlled Vital Signs Assessment: post-procedure vital signs reviewed and stable Respiratory status: spontaneous breathing, nonlabored ventilation, respiratory function stable and patient connected to nasal cannula oxygen Cardiovascular status: blood pressure returned to baseline and stable Postop Assessment: no apparent nausea or vomiting Anesthetic complications: no   No notable events documented.   Last Vitals:  Vitals:   10/25/21 1002 10/25/21 1012  BP: (!) 96/58 100/60  Pulse: 66 70  Resp: 15 20  Temp:    SpO2: 94% 96%    Last Pain:  Vitals:   10/25/21 1012  TempSrc:   PainSc: 0-No pain                 Molli Barrows

## 2021-10-25 NOTE — Transfer of Care (Signed)
Immediate Anesthesia Transfer of Care Note  Patient: Darius Parker  Procedure(s) Performed: ESOPHAGOGASTRODUODENOSCOPY (EGD) WITH PROPOFOL  Patient Location: PACU  Anesthesia Type:General  Level of Consciousness: awake, alert  and oriented  Airway & Oxygen Therapy: Patient Spontanous Breathing  Post-op Assessment: Report given to RN and Post -op Vital signs reviewed and stable  Post vital signs: Reviewed and stable  Last Vitals:  Vitals Value Taken Time  BP 80/61 10/25/21 0953  Temp 36.1 C 10/25/21 0952  Pulse 66 10/25/21 0956  Resp 15 10/25/21 0956  SpO2 94 % 10/25/21 0956  Vitals shown include unvalidated device data.  Last Pain:  Vitals:   10/25/21 0952  TempSrc: Temporal  PainSc: Asleep         Complications: No notable events documented.

## 2021-10-26 ENCOUNTER — Encounter: Payer: Self-pay | Admitting: Gastroenterology

## 2021-12-06 ENCOUNTER — Other Ambulatory Visit: Payer: Self-pay | Admitting: Family Medicine

## 2021-12-06 DIAGNOSIS — E785 Hyperlipidemia, unspecified: Secondary | ICD-10-CM

## 2021-12-06 NOTE — Telephone Encounter (Signed)
Medication Refill - Medication: omega-3 acid ethyl esters (LOVAZA) 1 g capsule  Has the patient contacted their pharmacy? Yes.   (Agent: If no, request that the patient contact the pharmacy for the refill. If patient does not wish to contact the pharmacy document the reason why and proceed with request.) (Agent: If yes, when and what did the pharmacy advise?) call dr as Darius Parker to new pharmacy  Preferred Pharmacy (with phone number or street name):  [place], [place] [facility] AT [address]  [place] [address]  Phone: [phone] Fax: [phone]  Hours: Not open 24 hours   Has the patient been seen for an appointment in the last year OR does the patient have an upcoming appointment? Yes.    Agent: Please be advised that RX refills may take up to 3 business days. We ask that you follow-up with your pharmacy.

## 2021-12-07 MED ORDER — OMEGA-3-ACID ETHYL ESTERS 1 G PO CAPS: 1.0000 g | ORAL_CAPSULE | Freq: Two times a day (BID) | ORAL | 0 refills | Status: AC

## 2021-12-07 NOTE — Telephone Encounter (Signed)
Requested Prescriptions  Pending Prescriptions Disp Refills  . omega-3 acid ethyl esters (LOVAZA) 1 g capsule 180 capsule 0    Sig: Take 1 capsule (1 g total) by mouth 2 (two) times daily.     Endocrinology:  Nutritional Agents - omega-3 acid ethyl esters Failed - 12/06/2021 12:16 PM      Failed - Lipid Panel in normal range within the last 12 months    Cholesterol, Total  Date Value Ref Range Status  06/15/2021 123 100 - 199 mg/dL Final   LDL Chol Calc (NIH)  Date Value Ref Range Status  06/15/2021 79 0 - 99 mg/dL Final   HDL  Date Value Ref Range Status  06/15/2021 26 (L) >39 mg/dL Final   Triglycerides  Date Value Ref Range Status  06/15/2021 96 0 - 149 mg/dL Final         Passed - Valid encounter within last 12 months    Recent Outpatient Visits          4 months ago Hypothyroidism, unspecified type   [facility] Birdie Sons, MD   5 months ago Essential hypertension   [facility] Birdie Sons, MD   6 months ago Vasomotor rhinitis   [facility] Birdie Sons, MD   11 months ago Annual physical exam   [facility] Birdie Sons, MD   1 year ago Onychomycosis   [facility] Birdie Sons, MD

## 2021-12-28 DIAGNOSIS — K219 Gastro-esophageal reflux disease without esophagitis: Secondary | ICD-10-CM | POA: Diagnosis not present

## 2021-12-28 DIAGNOSIS — I252 Old myocardial infarction: Secondary | ICD-10-CM | POA: Diagnosis not present

## 2021-12-28 DIAGNOSIS — E261 Secondary hyperaldosteronism: Secondary | ICD-10-CM | POA: Diagnosis not present

## 2021-12-28 DIAGNOSIS — I10 Essential (primary) hypertension: Secondary | ICD-10-CM | POA: Diagnosis not present

## 2021-12-28 DIAGNOSIS — Z7982 Long term (current) use of aspirin: Secondary | ICD-10-CM | POA: Diagnosis not present

## 2021-12-28 DIAGNOSIS — E039 Hypothyroidism, unspecified: Secondary | ICD-10-CM | POA: Diagnosis not present

## 2021-12-28 DIAGNOSIS — I110 Hypertensive heart disease with heart failure: Secondary | ICD-10-CM | POA: Diagnosis not present

## 2021-12-28 DIAGNOSIS — Z9849 Cataract extraction status, unspecified eye: Secondary | ICD-10-CM | POA: Diagnosis not present

## 2021-12-28 DIAGNOSIS — I5022 Chronic systolic (congestive) heart failure: Secondary | ICD-10-CM | POA: Diagnosis not present

## 2021-12-28 DIAGNOSIS — E782 Mixed hyperlipidemia: Secondary | ICD-10-CM | POA: Diagnosis not present

## 2021-12-28 DIAGNOSIS — M1990 Unspecified osteoarthritis, unspecified site: Secondary | ICD-10-CM | POA: Diagnosis not present

## 2021-12-28 DIAGNOSIS — I2510 Atherosclerotic heart disease of native coronary artery without angina pectoris: Secondary | ICD-10-CM | POA: Diagnosis not present

## 2022-01-05 ENCOUNTER — Ambulatory Visit (INDEPENDENT_AMBULATORY_CARE_PROVIDER_SITE_OTHER): Payer: PPO

## 2022-01-05 VITALS — BP 110/60 | Ht 74.0 in | Wt 194.5 lb

## 2022-01-05 DIAGNOSIS — Z0000 Encounter for general adult medical examination without abnormal findings: Secondary | ICD-10-CM

## 2022-01-05 NOTE — Progress Notes (Signed)
Subjective:   Darius Parker is a 76 y.o. male who presents for Medicare Annual/Subsequent preventive examination.  Review of Systems     Cardiac Risk Factors include: advanced age (>61mn, >>61women);dyslipidemia;hypertension;male gender     Objective:    Today's Vitals   01/05/22 0925  BP: 110/60  Weight: 194 lb 8 oz (88.2 kg)  Height: '6\' 2"'$  (1.88 m)   Body mass index is 24.97 kg/m.     01/05/2022    9:37 AM 10/25/2021    9:06 AM 08/16/2021    9:27 AM 03/15/2020    2:46 PM 12/24/2019    8:37 AM 12/21/2019   12:56 PM 02/09/2018   12:57 AM  Advanced Directives  Does Patient Have a Medical Advance Directive? No No No No No No No  Would patient like information on creating a medical advance directive? No - Patient declined No - Patient declined No - Patient declined No - Patient declined Yes (MAU/Ambulatory/Procedural Areas - Information given)  No - Patient declined    Current Medications (verified) Outpatient Encounter Medications as of 01/05/2022  Medication Sig   aspirin 81 MG chewable tablet Chew 1 tablet every day by oral route.   atorvastatin (LIPITOR) 10 MG tablet Take 10 mg at bedtime by mouth.   carvedilol (COREG) 12.5 MG tablet Take 12.5 mg by mouth 2 (two) times daily with a meal.   enalapril (VASOTEC) 10 MG tablet Take 10 mg by mouth 2 (two) times daily.   famotidine (PEPCID) 20 MG tablet TAKE 1 TABLET BY MOUTH TWICE A DAY   furosemide (LASIX) 20 MG tablet Take 20 mg by mouth daily.   levothyroxine (SYNTHROID) 25 MCG tablet TAKE 1 TABLET BY MOUTH EVERY DAY   meloxicam (MOBIC) 15 MG tablet Take 15 mg by mouth daily.   omega-3 acid ethyl esters (LOVAZA) 1 g capsule Take 1 capsule (1 g total) by mouth 2 (two) times daily.   silver sulfADIAZINE (SSD) 1 % cream    tamsulosin (FLOMAX) 0.4 MG CAPS capsule Take 1 capsule (0.4 mg total) by mouth daily.   [DISCONTINUED] enalapril (VASOTEC) 10 MG tablet Take 1 tablet by mouth 2 (two) times daily.   [DISCONTINUED] meloxicam  (MOBIC) 15 MG tablet Take 1 tablet by mouth daily.   amoxicillin-clavulanate (AUGMENTIN) 875-125 MG tablet  (Patient not taking: Reported on 01/05/2022)   apixaban (ELIQUIS) 5 MG TABS tablet Take 1 tablet by mouth every 12 (twelve) hours. (Patient not taking: Reported on 01/05/2022)   cephALEXin (KEFLEX) 500 MG capsule Take 1 capsule by mouth 2 (two) times daily. (Patient not taking: Reported on 01/05/2022)   ciprofloxacin (CIPRO) 500 MG tablet  (Patient not taking: Reported on 01/05/2022)   terbinafine (LAMISIL) 250 MG tablet TAKE 2 TABLETS (500 MG TOTAL) BY MOUTH DAILY. TAKE FOR 1 WEEK OUT OF EVERY 4 WEEKS (Patient not taking: Reported on 01/05/2022)   [DISCONTINUED] aspirin EC 81 MG tablet Take 162 mg by mouth daily. Swallow whole.   [DISCONTINUED] famotidine (PEPCID) 20 MG tablet Take 1 tablet by mouth 2 (two) times daily.   No facility-administered encounter medications on file as of 01/05/2022.    Allergies (verified) Tetanus toxoids   History: Past Medical History:  Diagnosis Date   Arthritis    Bursitis    CAP (community acquired pneumonia) 02/08/2018   Dysrhythmia    GERD (gastroesophageal reflux disease)    Heart murmur    Hepatitis    Pulmonary embolism ([place] 02/08/[place]   Stomach ulcer  Past Surgical History:  Procedure Laterality Date   BACK SURGERY     CARDIAC CATHETERIZATION     CATARACT EXTRACTION W/PHACO Right 03/01/2016   Procedure: CATARACT EXTRACTION PHACO AND INTRAOCULAR LENS PLACEMENT ([place]);  Surgeon: Estill Cotta, MD;  Location: [facility];  Service: Ophthalmology;  Laterality: Right;  [place] 1.33 AP% 21.6 CDE 37.45 Fluid Pack Lot # [id number]   CATARACT EXTRACTION W/PHACO Left 08/16/2021   Procedure: CATARACT EXTRACTION PHACO AND INTRAOCULAR LENS PLACEMENT ([facility]) LEFT 10.06 00:58.7;  Surgeon: Birder Robson, MD;  Location: [place];  Service: Ophthalmology;  Laterality: Left;  LEAVE LAST   COLONOSCOPY W/ POLYPECTOMY     ESOPHAGOGASTRODUODENOSCOPY  (EGD) WITH PROPOFOL N/A 10/25/2021   Procedure: ESOPHAGOGASTRODUODENOSCOPY (EGD) WITH PROPOFOL;  Surgeon: Lucilla Lame, MD;  Location: [facility];  Service: Endoscopy;  Laterality: N/A;   TONSILLECTOMY     Family History  Problem Relation Age of Onset   Diabetes Brother    Social History   Socioeconomic History   Marital status: Widowed    Spouse name: Not on file   Number of children: 1   Years of education: Not on file   Highest education level: Associate degree: occupational, Hotel manager, or vocational program  Occupational History   Occupation: retired    Comment: mows 4 lawns  Tobacco Use   Smoking status: Never   Smokeless tobacco: Never  Vaping Use   Vaping Use: Never used  Substance and Sexual Activity   Alcohol use: No   Drug use: Never   Sexual activity: Yes    Birth control/protection: None  Other Topics Concern   Not on file  Social History Narrative   Not on file   Social Determinants of Health   Financial Resource Strain: Low Risk  (01/05/2022)   Overall Financial Resource Strain (CARDIA)    Difficulty of Paying Living Expenses: Not hard at all  Food Insecurity: No Food Insecurity (01/05/2022)   Hunger Vital Sign    Worried About Running Out of Food in the Last Year: Never true    [place] in the Last Year: Never true  Transportation Needs: No Transportation Needs (01/05/2022)   PRAPARE - Hydrologist (Medical): No    Lack of Transportation (Non-Medical): No  Physical Activity: Insufficiently Active (01/05/2022)   Exercise Vital Sign    Days of Exercise per Week: 3 days    Minutes of Exercise per Session: 30 min  Stress: No Stress Concern Present (01/05/2022)   [place]    Feeling of Stress : Not at all  Social Connections: Moderately Isolated (01/05/2022)   Social Connection and Isolation Panel [NHANES]    Frequency of Communication with Friends and  Family: Twice a week    Frequency of Social Gatherings with Friends and Family: Once a week    Attends Religious Services: Never    Marine scientist or Organizations: Yes    Attends Music therapist: More than 4 times per year    Marital Status: Widowed    Tobacco Counseling Counseling given: Not Answered   Clinical Intake:  Pre-visit preparation completed: Yes  Pain : No/denies pain     Nutritional Risks: None Diabetes: No  How often do you need to have someone help you when you read instructions, pamphlets, or other written materials from your doctor or pharmacy?: 1 - Never  Diabetic?no  Interpreter Needed?: No  Information entered by ::  Kirke Shaggy, LPN   Activities of Daily Living    01/05/2022    9:38 AM 08/16/2021    9:31 AM  In your present state of health, do you have any difficulty performing the following activities:  Hearing? 0 0  Vision? 0 0  Difficulty concentrating or making decisions? 0 0  Walking or climbing stairs? 0 0  Dressing or bathing? 0 0  Doing errands, shopping? 0   Preparing Food and eating ? N   Using the Toilet? N   In the past six months, have you accidently leaked urine? N   Do you have problems with loss of bowel control? N   Managing your Medications? N   Managing your Finances? N   Housekeeping or managing your Housekeeping? N     Patient Care Team: Birdie Sons, MD as PCP - General (Family Medicine) Corey Skains, MD as Consulting Physician (Cardiology) [place], [facility])  Indicate any recent Medical Services you may have received from other than [facility] providers in the past year (date may be approximate).     Assessment:   This is a routine wellness examination for [place].  Hearing/Vision screen Hearing Screening - Comments:: No aids Vision Screening - Comments:: Wears glasses- [facility]  Dietary issues and exercise activities discussed: Current Exercise Habits: Home  exercise routine, Type of exercise: walking, Time (Minutes): 30, Frequency (Times/Week): 3, Weekly Exercise (Minutes/Week): 90, Intensity: Mild   Goals Addressed             This Visit's Progress    DIET - EAT MORE FRUITS AND VEGETABLES         Depression Screen    01/05/2022    9:31 AM 12/31/2020    2:15 PM 12/24/2019    8:31 AM 08/19/2019    9:34 AM  PHQ 2/9 Scores  PHQ - 2 Score 0 1 2 0  PHQ- 9 Score 0 1 6     Fall Risk    01/05/2022    9:38 AM 12/31/2020    2:15 PM 12/24/2019    8:24 AM  Fall Risk   Falls in the past year? 0 1 0  Number falls in past yr: 0 1 0  Injury with Fall? 0 1 0  Risk for fall due to : No Fall Risks History of fall(s)   Follow up Falls evaluation completed Falls prevention discussed     FALL RISK PREVENTION PERTAINING TO THE HOME:  Any stairs in or around the home? Yes  If so, are there any without handrails? No  Home free of loose throw rugs in walkways, pet beds, electrical cords, etc? Yes  Adequate lighting in your home to reduce risk of falls? Yes   ASSISTIVE DEVICES UTILIZED TO PREVENT FALLS:  Life alert? No  Use of a cane, walker or w/c? No  Grab bars in the bathroom? No  Shower chair or bench in shower? Yes  Elevated toilet seat or a handicapped toilet? Yes   TIMED UP AND GO:  Was the test performed? Yes .  Length of time to ambulate 10 feet: 4 sec.   Gait steady and fast without use of assistive device  Cognitive Function:        01/05/2022    9:40 AM  6CIT Screen  What Year? 0 points  What month? 0 points  What time? 0 points  Count back from 20 0 points  Months in reverse 2 points  Repeat phrase 4 points  Total Score 6 points    Immunizations Immunization History  Administered Date(s) Administered   PFIZER(Purple Top)SARS-COV-2 Vaccination 07/24/2019, 08/19/2019   Pneumococcal Polysaccharide-23 12/24/2019    TDAP status: Due, Education has been provided regarding the importance of this vaccine. Advised may  receive this vaccine at local pharmacy or Health Dept. Aware to provide a copy of the vaccination record if obtained from local pharmacy or Health Dept. Verbalized acceptance and understanding.- pt is allergic to this vaccine  Flu Vaccine status: Declined, Education has been provided regarding the importance of this vaccine but patient still declined. Advised may receive this vaccine at local pharmacy or Health Dept. Aware to provide a copy of the vaccination record if obtained from local pharmacy or Health Dept. Verbalized acceptance and understanding.  Pneumococcal vaccine status: Due, Education has been provided regarding the importance of this vaccine. Advised may receive this vaccine at local pharmacy or Health Dept. Aware to provide a copy of the vaccination record if obtained from local pharmacy or Health Dept. Verbalized acceptance and understanding.  Covid-19 vaccine status: Completed vaccines  Qualifies for Shingles Vaccine? Yes   Zostavax completed No   Shingrix Completed?: No.    Education has been provided regarding the importance of this vaccine. Patient has been advised to call insurance company to determine out of pocket expense if they have not yet received this vaccine. Advised may also receive vaccine at local pharmacy or Health Dept. Verbalized acceptance and understanding.  Screening Tests Health Maintenance  Topic Date Due   TETANUS/TDAP  Never done   Zoster Vaccines- Shingrix (1 of 2) Never done   COVID-19 Vaccine (3 - Pfizer risk series) 09/16/2019   Pneumonia Vaccine 25+ Years old (2 - PCV) 12/23/2020   INFLUENZA VACCINE  12/20/2021   Hepatitis C Screening  Completed   HPV VACCINES  Aged Out   COLONOSCOPY (Pts 45-70yr Insurance coverage will need to be confirmed)  Discontinued    Health Maintenance  Health Maintenance Due  Topic Date Due   TETANUS/TDAP  Never done   Zoster Vaccines- Shingrix (1 of 2) Never done   COVID-19 Vaccine (3 - Pfizer risk series)  09/16/2019   Pneumonia Vaccine 78 Years old (2 - PCV) 12/23/2020   INFLUENZA VACCINE  12/20/2021    Colorectal cancer screening: No longer required.   Lung Cancer Screening: (Low Dose CT Chest recommended if Age 543-80years, 30 pack-year currently smoking OR have quit w/in 15years.) does not qualify.   Additional Screening:  Hepatitis C Screening: does qualify; Completed 12/24/19  Vision Screening: Recommended annual ophthalmology exams for early detection of glaucoma and other disorders of the eye. Is the patient up to date with their annual eye exam?  Yes  Who is the provider or what is the name of the office in which the patient attends annual eye exams? [facility] SimonIf pt is not established with a provider, would they like to be referred to a provider to establish care? No .   Dental Screening: Recommended annual dental exams for proper oral hygiene  Community Resource Referral / Chronic Care Management: CRR required this visit?  No   CCM required this visit?  No      Plan:     I have personally reviewed and noted the following in the patient's chart:   Medical and social history Use of alcohol, tobacco or illicit drugs  Current medications and supplements including opioid prescriptions. Patient is not currently taking opioid prescriptions. Functional ability and status Nutritional  status Physical activity Advanced directives List of other physicians Hospitalizations, surgeries, and ER visits in previous 12 months Vitals Screenings to include cognitive, depression, and falls Referrals and appointments  In addition, I have reviewed and discussed with patient certain preventive protocols, quality metrics, and best practice recommendations. A written personalized care plan for preventive services as well as general preventive health recommendations were provided to patient.     Dionisio David, LPN   7/34/0370   Nurse Notes: none

## 2022-01-05 NOTE — Patient Instructions (Addendum)
Darius Parker , Thank you for taking time to come for your Medicare Wellness Visit. I appreciate your ongoing commitment to your health goals. Please review the following plan we discussed and let me know if I can assist you in the future.   Screening recommendations/referrals: Colonoscopy: aged out Recommended yearly ophthalmology/optometry visit for glaucoma screening and checkup Recommended yearly dental visit for hygiene and checkup  Vaccinations: Influenza vaccine: n/d Pneumococcal vaccine: 12/24/19 Tdap vaccine: allergic Shingles vaccine: n/d   Covid-19: 07/24/19, 08/19/19  Advanced directives: no  Conditions/risks identified: none  Next appointment: Follow up in one year for your annual wellness visit. 01/08/23 @ 10:45 am in person  Preventive Care 78 Years and Older, Male Preventive care refers to lifestyle choices and visits with your health care provider that can promote health and wellness. What does preventive care include? A yearly physical exam. This is also called an annual well check. Dental exams once or twice a year. Routine eye exams. Ask your health care provider how often you should have your eyes checked. Personal lifestyle choices, including: Daily care of your teeth and gums. Regular physical activity. Eating a healthy diet. Avoiding tobacco and drug use. Limiting alcohol use. Practicing safe sex. Taking low doses of aspirin every day. Taking vitamin and mineral supplements as recommended by your health care provider. What happens during an annual well check? The services and screenings done by your health care provider during your annual well check will depend on your age, overall health, lifestyle risk factors, and family history of disease. Counseling  Your health care provider may ask you questions about your: Alcohol use. Tobacco use. Drug use. Emotional well-being. Home and relationship well-being. Sexual activity. Eating habits. History of  falls. Memory and ability to understand (cognition). Work and work Statistician. Screening  You may have the following tests or measurements: Height, weight, and BMI. Blood pressure. Lipid and cholesterol levels. These may be checked every 5 years, or more frequently if you are over 49 years old. Skin check. Lung cancer screening. You may have this screening every year starting at age 78 if you have a 30-pack-year history of smoking and currently smoke or have quit within the past 15 years. Fecal occult blood test (FOBT) of the stool. You may have this test every year starting at age 78. Flexible sigmoidoscopy or colonoscopy. You may have a sigmoidoscopy every 5 years or a colonoscopy every 10 years starting at age 78. Prostate cancer screening. Recommendations will vary depending on your family history and other risks. Hepatitis C blood test. Hepatitis B blood test. Sexually transmitted disease (STD) testing. Diabetes screening. This is done by checking your blood sugar (glucose) after you have not eaten for a while (fasting). You may have this done every 1-3 years. Abdominal aortic aneurysm (AAA) screening. You may need this if you are a current or former smoker. Osteoporosis. You may be screened starting at age 32 if you are at high risk. Talk with your health care provider about your test results, treatment options, and if necessary, the need for more tests. Vaccines  Your health care provider may recommend certain vaccines, such as: Influenza vaccine. This is recommended every year. Tetanus, diphtheria, and acellular pertussis (Tdap, Td) vaccine. You may need a Td booster every 10 years. Zoster vaccine. You may need this after age 70. Pneumococcal 13-valent conjugate (PCV13) vaccine. One dose is recommended after age 78. Pneumococcal polysaccharide (PPSV23) vaccine. One dose is recommended after age 78. Talk to your health care  provider about which screenings and vaccines you need and  how often you need them. This information is not intended to replace advice given to you by your health care provider. Make sure you discuss any questions you have with your health care provider. Document Released: 06/04/2015 Document Revised: 01/26/2016 Document Reviewed: 03/09/2015 Elsevier Interactive Patient Education  2017 [place] Prevention in the Home Falls can cause injuries. They can happen to people of all ages. There are many things you can do to make your home safe and to help prevent falls. What can I do on the outside of my home? Regularly fix the edges of walkways and driveways and fix any cracks. Remove anything that might make you trip as you walk through a door, such as a raised step or threshold. Trim any bushes or trees on the path to your home. Use bright outdoor lighting. Clear any walking paths of anything that might make someone trip, such as rocks or tools. Regularly check to see if handrails are loose or broken. Make sure that both sides of any steps have handrails. Any raised decks and porches should have guardrails on the edges. Have any leaves, snow, or ice cleared regularly. Use sand or salt on walking paths during winter. Clean up any spills in your garage right away. This includes oil or grease spills. What can I do in the bathroom? Use night lights. Install grab bars by the toilet and in the tub and shower. Do not use towel bars as grab bars. Use non-skid mats or decals in the tub or shower. If you need to sit down in the shower, use a plastic, non-slip stool. Keep the floor dry. Clean up any water that spills on the floor as soon as it happens. Remove soap buildup in the tub or shower regularly. Attach bath mats securely with double-sided non-slip rug tape. Do not have throw rugs and other things on the floor that can make you trip. What can I do in the bedroom? Use night lights. Make sure that you have a light by your bed that is easy to  reach. Do not use any sheets or blankets that are too big for your bed. They should not hang down onto the floor. Have a firm chair that has side arms. You can use this for support while you get dressed. Do not have throw rugs and other things on the floor that can make you trip. What can I do in the kitchen? Clean up any spills right away. Avoid walking on wet floors. Keep items that you use a lot in easy-to-reach places. If you need to reach something above you, use a strong step stool that has a grab bar. Keep electrical cords out of the way. Do not use floor polish or wax that makes floors slippery. If you must use wax, use non-skid floor wax. Do not have throw rugs and other things on the floor that can make you trip. What can I do with my stairs? Do not leave any items on the stairs. Make sure that there are handrails on both sides of the stairs and use them. Fix handrails that are broken or loose. Make sure that handrails are as long as the stairways. Check any carpeting to make sure that it is firmly attached to the stairs. Fix any carpet that is loose or worn. Avoid having throw rugs at the top or bottom of the stairs. If you do have throw rugs, attach them to the  floor with carpet tape. Make sure that you have a light switch at the top of the stairs and the bottom of the stairs. If you do not have them, ask someone to add them for you. What else can I do to help prevent falls? Wear shoes that: Do not have high heels. Have rubber bottoms. Are comfortable and fit you well. Are closed at the toe. Do not wear sandals. If you use a stepladder: Make sure that it is fully opened. Do not climb a closed stepladder. Make sure that both sides of the stepladder are locked into place. Ask someone to hold it for you, if possible. Clearly mark and make sure that you can see: Any grab bars or handrails. First and last steps. Where the edge of each step is. Use tools that help you move  around (mobility aids) if they are needed. These include: Canes. Walkers. Scooters. Crutches. Turn on the lights when you go into a dark area. Replace any light bulbs as soon as they burn out. Set up your furniture so you have a clear path. Avoid moving your furniture around. If any of your floors are uneven, fix them. If there are any pets around you, be aware of where they are. Review your medicines with your doctor. Some medicines can make you feel dizzy. This can increase your chance of falling. Ask your doctor what other things that you can do to help prevent falls. This information is not intended to replace advice given to you by your health care provider. Make sure you discuss any questions you have with your health care provider. Document Released: 03/04/2009 Document Revised: 10/14/2015 Document Reviewed: 06/12/2014 Elsevier Interactive Patient Education  2017 [employer].

## 2022-02-28 ENCOUNTER — Ambulatory Visit (INDEPENDENT_AMBULATORY_CARE_PROVIDER_SITE_OTHER): Payer: PPO | Admitting: Physician Assistant

## 2022-02-28 ENCOUNTER — Ambulatory Visit: Payer: Self-pay

## 2022-02-28 ENCOUNTER — Ambulatory Visit
Admission: RE | Admit: 2022-02-28 | Discharge: 2022-02-28 | Disposition: A | Discharge status: Home / Self Care | Payer: PPO | Source: Ambulatory Visit | Attending: Physician Assistant | Admitting: Physician Assistant

## 2022-02-28 ENCOUNTER — Ambulatory Visit
Admission: RE | Admit: 2022-02-28 | Discharge: 2022-02-28 | Disposition: A | Discharge status: Home / Self Care | Payer: PPO | Attending: Physician Assistant | Admitting: Physician Assistant

## 2022-02-28 ENCOUNTER — Encounter: Payer: Self-pay | Admitting: Physician Assistant

## 2022-02-28 VITALS — BP 108/70 | HR 77 | Resp 16 | Wt 191.0 lb

## 2022-02-28 DIAGNOSIS — M25511 Pain in right shoulder: Secondary | ICD-10-CM | POA: Insufficient documentation

## 2022-02-28 NOTE — Progress Notes (Unsigned)
I,Darious Rehman,acting as a Education administrator for [employer], PA-C.,have documented all relevant documentation on the behalf of Mardene Speak, PA-C,as directed by  [employer], PA-C while in the presence of [employer], PA-C.   Established patient visit   Patient: Darius Parker   DOB: 1945/09/30   76 y.o. Male  MRN: [id number] Visit Date: 02/28/2022  Today's healthcare provider: Mardene Speak, PA-C   Chief Complaint  Patient presents with   Shoulder Pain   Subjective    HPI  Patient has had right shoulder pain for 3 days. Patient states when he lays down at night his right shoulder hurts. Patient states position does not alter pain. Patient states he has no pain while he is up moving around. No injury mechanism? However, he cannot deny that he has been carrying/lifting luggage during his recent travel.  Medications: Outpatient Medications Prior to Visit  Medication Sig   aspirin 81 MG chewable tablet 162 mg.   atorvastatin (LIPITOR) 10 MG tablet Take 10 mg at bedtime by mouth.   carvedilol (COREG) 12.5 MG tablet Take 12.5 mg by mouth 2 (two) times daily with a meal.   enalapril (VASOTEC) 10 MG tablet Take 10 mg by mouth 2 (two) times daily.   famotidine (PEPCID) 20 MG tablet TAKE 1 TABLET BY MOUTH TWICE A DAY   furosemide (LASIX) 20 MG tablet Take 20 mg by mouth daily.   levothyroxine (SYNTHROID) 25 MCG tablet TAKE 1 TABLET BY MOUTH EVERY DAY   meloxicam (MOBIC) 15 MG tablet Take 15 mg by mouth daily.   omega-3 acid ethyl esters (LOVAZA) 1 g capsule Take 1 capsule (1 g total) by mouth 2 (two) times daily.   tamsulosin (FLOMAX) 0.4 MG CAPS capsule Take 1 capsule (0.4 mg total) by mouth daily.   [DISCONTINUED] amoxicillin-clavulanate (AUGMENTIN) 875-125 MG tablet  (Patient not taking: Reported on 01/05/2022)   [DISCONTINUED] apixaban (ELIQUIS) 5 MG TABS tablet Take 1 tablet by mouth every 12 (twelve) hours. (Patient not taking: Reported on 01/05/2022)   [DISCONTINUED] cephALEXin  (KEFLEX) 500 MG capsule Take 1 capsule by mouth 2 (two) times daily. (Patient not taking: Reported on 01/05/2022)   [DISCONTINUED] ciprofloxacin (CIPRO) 500 MG tablet  (Patient not taking: Reported on 01/05/2022)   [DISCONTINUED] silver sulfADIAZINE (SSD) 1 % cream  (Patient not taking: Reported on 02/28/2022)   [DISCONTINUED] terbinafine (LAMISIL) 250 MG tablet TAKE 2 TABLETS (500 MG TOTAL) BY MOUTH DAILY. TAKE FOR 1 WEEK OUT OF EVERY 4 WEEKS (Patient not taking: Reported on 01/05/2022)   No facility-administered medications prior to visit.    Review of Systems  All other systems reviewed and are negative.  Except see HPI    Objective    BP 108/70 (BP Location: Left Arm, Patient Position: Sitting, Cuff Size: Normal)   Pulse 77   Resp 16   Wt 191 lb (86.6 kg)   SpO2 98%   BMI 24.52 kg/m    Physical Exam Vitals reviewed.  Constitutional:      General: He is not in acute distress.    Appearance: Normal appearance. He is not diaphoretic.  HENT:     Head: Normocephalic and atraumatic.     Nose: Nose normal.  Eyes:     General: No scleral icterus.    Conjunctiva/sclera: Conjunctivae normal.  Cardiovascular:     Rate and Rhythm: Normal rate and regular rhythm.     Pulses: Normal pulses.     Heart sounds: Normal heart sounds. No murmur  heard. Pulmonary:     Effort: Pulmonary effort is normal. No respiratory distress.     Breath sounds: Normal breath sounds. No wheezing or rhonchi.  Musculoskeletal:        General: Tenderness (right shoulder with movements) present. No swelling, deformity or signs of injury.     Cervical back: Neck supple.     Right lower leg: No edema.     Left lower leg: No edema.  Lymphadenopathy:     Cervical: No cervical adenopathy.  Skin:    General: Skin is warm and dry.     Findings: No rash.  Neurological:     Mental Status: He is alert and oriented to person, place, and time. Mental status is at baseline.  Psychiatric:        Behavior: Behavior  normal.        Thought Content: Thought content normal.        Judgment: Judgment normal.       No results found for any visits on 02/28/22.  Assessment & Plan     1. Right shoulder pain, unspecified chronicity Due to tendinitis, rotator cuff injury, overuse injury  OTC pain medications or topical antiinflammatory, ice, rest, stretching exercise, Modifying movement in the tender joint  - DG Shoulder Right; Future Might need to see Ortho and PT Pt declined at this point.  Will reassess in 1-2 weeks.    The patient was advised to call back or seek an in-person evaluation if the symptoms worsen or if the condition fails to improve as anticipated.  I discussed the assessment and treatment plan with the patient. The patient was provided an opportunity to ask questions and all were answered. The patient agreed with the plan and demonstrated an understanding of the instructions.  The entirety of the information documented in the History of Present Illness, Review of Systems and Physical Exam were personally obtained by me. Portions of this information were initially documented by the CMA and reviewed by me for thoroughness and accuracy.  Portions of this note were created using dictation software and may contain typographical errors.     Mardene Speak, PA-C  [facility] [phone] (phone) [phone] (fax)  [place]

## 2022-02-28 NOTE — Telephone Encounter (Signed)
  Chief Complaint: Right shoulder pain Symptoms: ibid Frequency: 3 days Pertinent Negatives: Patient denies fever Disposition: '[]'$ ED /'[]'$ Urgent Care (no appt availability in office) / '[x]'$ Appointment(In office/virtual)/ '[]'$  [employer] [facility]/ '[]'$ Home Care/ '[]'$ Refused Recommended Disposition /'[]'$ [facility]/ '[]'$  Follow-up with PCP Additional Notes: Pt started experiencing right shoulder pain about 3 days ago. When pt is lying down pain is 7-8/10 when vertical pain is 2-3/10. Pt does not recall any injury to shoulder.    Reason for Disposition  [1] MODERATE pain (e.g., interferes with normal activities) AND [2] present > 3 days  Answer Assessment - Initial Assessment Questions 1. ONSET: "When did the pain start?"     3 days ago 2. LOCATION: "Where is the pain located?"     Shoulder right shoulder 3. PAIN: "How bad is the pain?" (Scale 1-10; or mild, moderate, severe)   - MILD (1-3): doesn't interfere with normal activities   - MODERATE (4-7): interferes with normal activities (e.g., work or school) or awakens from sleep   - SEVERE (8-10): excruciating pain, unable to do any normal activities, unable to move arm at all due to pain     Laying down - pain 7-8/10, Standing up 2-3/10 4. WORK OR EXERCISE: "Has there been any recent work or exercise that involved this part of the body?"     no 5. CAUSE: "What do you think is causing the shoulder pain?"     Bone spur 6. OTHER SYMPTOMS: "Do you have any other symptoms?" (e.g., neck pain, swelling, rash, fever, numbness, weakness)     no 7. PREGNANCY: "Is there any chance you are pregnant?" "When was your last menstrual period?"     na  Protocols used: Shoulder Pain-A-AH

## 2022-03-01 ENCOUNTER — Telehealth: Payer: Self-pay

## 2022-03-01 NOTE — Telephone Encounter (Signed)
Patient asking for imaging results

## 2022-03-02 ENCOUNTER — Telehealth: Payer: Self-pay

## 2022-03-02 NOTE — Telephone Encounter (Signed)
Pt called to request imaging results. Pt requests call back asap to discuss results.

## 2022-03-06 NOTE — Telephone Encounter (Signed)
Pt called saying he needs the results of shoulder xray on Tuesday of last week,  CB [phone]

## 2022-03-06 NOTE — Progress Notes (Signed)
Hello Darius Parker ,   Your imaging results all are back  No fracture or dislocation is seen in right shoulder. There is linear coarse calcification adjacent to the greater tuberosity of proximal humerus suggesting possible calcific bursitis or calcific tendinosis. Treatment of calcific bursitis/ tendonitis includes observation, local injections, shockwave therapy and surgical resection. You can discuss it in details with orthopedics You could try PT first  Best, Mardene Speak, PA-C

## 2022-03-10 ENCOUNTER — Ambulatory Visit (INDEPENDENT_AMBULATORY_CARE_PROVIDER_SITE_OTHER): Payer: PPO | Admitting: Physician Assistant

## 2022-03-10 ENCOUNTER — Encounter: Payer: Self-pay | Admitting: Physician Assistant

## 2022-03-10 ENCOUNTER — Ambulatory Visit: Payer: PPO | Admitting: Physician Assistant

## 2022-03-10 VITALS — BP 114/72 | HR 83 | Wt 192.7 lb

## 2022-03-10 DIAGNOSIS — M25511 Pain in right shoulder: Secondary | ICD-10-CM

## 2022-03-10 DIAGNOSIS — K120 Recurrent oral aphthae: Secondary | ICD-10-CM

## 2022-03-10 NOTE — Assessment & Plan Note (Signed)
Bursitis vs tendonitis Advised pt for the best relief,  He should visit ortho for intraarticular injection  Referral placed Advised [facility] has urgent care hours as well In the mean time, heat/ice, pt already takes mobic daily, can add tylenol

## 2022-03-10 NOTE — Assessment & Plan Note (Signed)
Inner bottom lip Advised salt water rinses, otc therapy including alum

## 2022-03-10 NOTE — Progress Notes (Signed)
I,Sha'taria Tyson,acting as a Education administrator for [employer], PA-C.,have documented all relevant documentation on the behalf of Mikey Kirschner, PA-C,as directed by  Mikey Kirschner, PA-C while in the presence of Mikey Kirschner, PA-C.   Established patient visit   Patient: Darius Parker   DOB: 10-31-1945   76 y.o. Male  MRN: [id number] Visit Date: 03/10/2022  Today's healthcare provider: Mikey Kirschner, PA-C   Cc. Right shoulder pain  Subjective    Follow up for shoulder pain  The patient was last seen for this 10 days ago. Changes made at last visit include advised to use OTC pain medications or topical antiinflammatory, ice, rest, stretching exercise, and modifying movement in the tender joint. Ordered DG Shoulder Right and advised he might need to see Ortho and PT Pt declined at this point.  He reports poor compliance with treatment. He feels that condition is Unchanged. He is not having side effects.   Xray results R shoulder 02/28/22: IMPRESSION: No fracture or dislocation is seen in right shoulder. There is linear coarse calcification adjacent to the greater tuberosity of proximal humerus suggesting possible calcific bursitis or calcific tendinosis. -----------------------------------------------------------------------------------------  Pt also reports an irritated canker sore along his lower inner lip.  Wonders how he can get rid of it.  Medications: Outpatient Medications Prior to Visit  Medication Sig   aspirin 81 MG chewable tablet 162 mg.   atorvastatin (LIPITOR) 10 MG tablet Take 10 mg at bedtime by mouth.   carvedilol (COREG) 12.5 MG tablet Take 12.5 mg by mouth 2 (two) times daily with a meal.   enalapril (VASOTEC) 10 MG tablet Take 10 mg by mouth 2 (two) times daily.   famotidine (PEPCID) 20 MG tablet TAKE 1 TABLET BY MOUTH TWICE A DAY   furosemide (LASIX) 20 MG tablet Take 20 mg by mouth daily.   levothyroxine (SYNTHROID) 25 MCG tablet TAKE 1 TABLET BY  MOUTH EVERY DAY   meloxicam (MOBIC) 15 MG tablet Take 15 mg by mouth daily.   omega-3 acid ethyl esters (LOVAZA) 1 g capsule Take 1 capsule (1 g total) by mouth 2 (two) times daily.   tamsulosin (FLOMAX) 0.4 MG CAPS capsule Take 1 capsule (0.4 mg total) by mouth daily.   No facility-administered medications prior to visit.       Objective    Blood pressure 114/72, pulse 83, weight 192 lb 11.2 oz (87.4 kg), SpO2 100 %.   Physical Exam Vitals reviewed.  Constitutional:      Appearance: He is not ill-appearing.  HENT:     Head: Normocephalic.  Eyes:     Conjunctiva/sclera: Conjunctivae normal.  Cardiovascular:     Rate and Rhythm: Normal rate.  Pulmonary:     Effort: Pulmonary effort is normal. No respiratory distress.  Neurological:     General: No focal deficit present.     Mental Status: He is alert and oriented to person, place, and time.  Psychiatric:        Mood and Affect: Mood normal.        Behavior: Behavior normal.      No results found for any visits on 03/10/22.  Assessment & Plan     Problem List Items Addressed This Visit       Digestive   Canker sore    Inner bottom lip Advised salt water rinses, otc therapy including alum        Other   Right shoulder pain - Primary    Bursitis vs  tendonitis Advised pt for the best relief,  He should visit ortho for intraarticular injection  Referral placed Advised [facility] has urgent care hours as well In the mean time, heat/ice, pt already takes mobic daily, can add tylenol       Relevant Orders   Ambulatory referral to Orthopedics    Return in about 4 weeks (around 04/07/2022) for chronic conditions w/ pcp.     I, Mikey Kirschner, PA-C have reviewed all documentation for this visit. The documentation on  03/10/2022  for the exam, diagnosis, procedures, and orders are all accurate and complete.   Mikey Kirschner, PA-C [facility] [address] [place], [place],  [address] Office: [phone] Fax: [place]

## 2022-03-13 ENCOUNTER — Telehealth: Payer: Self-pay

## 2022-03-13 NOTE — Telephone Encounter (Signed)
Referral sent to [facility]. Call could not be completed to patient mobile number. CRM created [facility] for [place] to advise if patient returns call

## 2022-03-13 NOTE — Telephone Encounter (Signed)
Copied from [place] [id number]. Topic: Referral - Question >> Mar 13, 2022  8:34 AM Penni Bombard wrote: Reason for CRM: Pt called asking where is he being referred to for ortho.    CB@  [phone]

## 2022-03-14 DIAGNOSIS — M7541 Impingement syndrome of right shoulder: Secondary | ICD-10-CM | POA: Diagnosis not present

## 2022-03-14 NOTE — Telephone Encounter (Signed)
Patient advised.

## 2022-03-20 ENCOUNTER — Ambulatory Visit: Payer: Self-pay

## 2022-03-20 ENCOUNTER — Ambulatory Visit
Admission: RE | Admit: 2022-03-20 | Discharge: 2022-03-20 | Disposition: A | Discharge status: Home / Self Care | Payer: PPO | Source: Ambulatory Visit | Attending: Family Medicine | Admitting: Family Medicine

## 2022-03-20 ENCOUNTER — Ambulatory Visit
Admission: RE | Admit: 2022-03-20 | Discharge: 2022-03-20 | Disposition: A | Discharge status: Home / Self Care | Payer: PPO | Source: Home / Self Care | Attending: Family Medicine | Admitting: Family Medicine

## 2022-03-20 ENCOUNTER — Ambulatory Visit (INDEPENDENT_AMBULATORY_CARE_PROVIDER_SITE_OTHER): Payer: PPO | Admitting: Family Medicine

## 2022-03-20 ENCOUNTER — Encounter: Payer: Self-pay | Admitting: Family Medicine

## 2022-03-20 VITALS — BP 116/71 | HR 84 | Resp 18 | Ht 74.0 in | Wt 191.3 lb

## 2022-03-20 DIAGNOSIS — E872 Acidosis, unspecified: Secondary | ICD-10-CM | POA: Diagnosis present

## 2022-03-20 DIAGNOSIS — D72829 Elevated white blood cell count, unspecified: Secondary | ICD-10-CM | POA: Diagnosis present

## 2022-03-20 DIAGNOSIS — R180 Malignant ascites: Secondary | ICD-10-CM | POA: Diagnosis present

## 2022-03-20 DIAGNOSIS — R0602 Shortness of breath: Secondary | ICD-10-CM | POA: Diagnosis not present

## 2022-03-20 DIAGNOSIS — N401 Enlarged prostate with lower urinary tract symptoms: Secondary | ICD-10-CM | POA: Diagnosis present

## 2022-03-20 DIAGNOSIS — I10 Essential (primary) hypertension: Secondary | ICD-10-CM | POA: Diagnosis not present

## 2022-03-20 DIAGNOSIS — Z8711 Personal history of peptic ulcer disease: Secondary | ICD-10-CM | POA: Diagnosis not present

## 2022-03-20 DIAGNOSIS — I2510 Atherosclerotic heart disease of native coronary artery without angina pectoris: Secondary | ICD-10-CM | POA: Diagnosis present

## 2022-03-20 DIAGNOSIS — E782 Mixed hyperlipidemia: Secondary | ICD-10-CM | POA: Diagnosis present

## 2022-03-20 DIAGNOSIS — C786 Secondary malignant neoplasm of retroperitoneum and peritoneum: Secondary | ICD-10-CM | POA: Diagnosis present

## 2022-03-20 DIAGNOSIS — N289 Disorder of kidney and ureter, unspecified: Secondary | ICD-10-CM | POA: Diagnosis not present

## 2022-03-20 DIAGNOSIS — R350 Frequency of micturition: Secondary | ICD-10-CM

## 2022-03-20 DIAGNOSIS — J90 Pleural effusion, not elsewhere classified: Secondary | ICD-10-CM | POA: Diagnosis not present

## 2022-03-20 DIAGNOSIS — E875 Hyperkalemia: Secondary | ICD-10-CM | POA: Diagnosis present

## 2022-03-20 DIAGNOSIS — K219 Gastro-esophageal reflux disease without esophagitis: Secondary | ICD-10-CM | POA: Diagnosis present

## 2022-03-20 DIAGNOSIS — K669 Disorder of peritoneum, unspecified: Secondary | ICD-10-CM | POA: Diagnosis present

## 2022-03-20 DIAGNOSIS — J9811 Atelectasis: Secondary | ICD-10-CM | POA: Diagnosis not present

## 2022-03-20 DIAGNOSIS — E039 Hypothyroidism, unspecified: Secondary | ICD-10-CM | POA: Diagnosis present

## 2022-03-20 DIAGNOSIS — K869 Disease of pancreas, unspecified: Secondary | ICD-10-CM | POA: Diagnosis present

## 2022-03-20 DIAGNOSIS — C801 Malignant (primary) neoplasm, unspecified: Secondary | ICD-10-CM | POA: Diagnosis present

## 2022-03-20 DIAGNOSIS — R748 Abnormal levels of other serum enzymes: Secondary | ICD-10-CM | POA: Diagnosis not present

## 2022-03-20 DIAGNOSIS — R630 Anorexia: Secondary | ICD-10-CM | POA: Diagnosis present

## 2022-03-20 DIAGNOSIS — I5022 Chronic systolic (congestive) heart failure: Secondary | ICD-10-CM | POA: Diagnosis present

## 2022-03-20 DIAGNOSIS — R682 Dry mouth, unspecified: Secondary | ICD-10-CM | POA: Diagnosis present

## 2022-03-20 DIAGNOSIS — Z86711 Personal history of pulmonary embolism: Secondary | ICD-10-CM | POA: Diagnosis not present

## 2022-03-20 DIAGNOSIS — R0600 Dyspnea, unspecified: Secondary | ICD-10-CM | POA: Diagnosis not present

## 2022-03-20 DIAGNOSIS — I2489 Other forms of acute ischemic heart disease: Secondary | ICD-10-CM | POA: Diagnosis present

## 2022-03-20 DIAGNOSIS — I130 Hypertensive heart and chronic kidney disease with heart failure and stage 1 through stage 4 chronic kidney disease, or unspecified chronic kidney disease: Secondary | ICD-10-CM | POA: Diagnosis present

## 2022-03-20 DIAGNOSIS — K8020 Calculus of gallbladder without cholecystitis without obstruction: Secondary | ICD-10-CM | POA: Diagnosis not present

## 2022-03-20 DIAGNOSIS — N179 Acute kidney failure, unspecified: Secondary | ICD-10-CM | POA: Diagnosis present

## 2022-03-20 DIAGNOSIS — R0609 Other forms of dyspnea: Secondary | ICD-10-CM | POA: Insufficient documentation

## 2022-03-20 DIAGNOSIS — K8689 Other specified diseases of pancreas: Secondary | ICD-10-CM | POA: Diagnosis present

## 2022-03-20 DIAGNOSIS — C482 Malignant neoplasm of peritoneum, unspecified: Secondary | ICD-10-CM | POA: Diagnosis not present

## 2022-03-20 DIAGNOSIS — Z961 Presence of intraocular lens: Secondary | ICD-10-CM | POA: Diagnosis present

## 2022-03-20 DIAGNOSIS — K7460 Unspecified cirrhosis of liver: Secondary | ICD-10-CM | POA: Diagnosis present

## 2022-03-20 DIAGNOSIS — R188 Other ascites: Secondary | ICD-10-CM | POA: Diagnosis not present

## 2022-03-20 DIAGNOSIS — N1831 Chronic kidney disease, stage 3a: Secondary | ICD-10-CM | POA: Diagnosis present

## 2022-03-20 DIAGNOSIS — C259 Malignant neoplasm of pancreas, unspecified: Secondary | ICD-10-CM | POA: Diagnosis not present

## 2022-03-20 LAB — POCT URINALYSIS DIPSTICK
Bilirubin, UA: NEGATIVE
Blood, UA: NEGATIVE
Glucose, UA: NEGATIVE
Ketones, UA: NEGATIVE
Leukocytes, UA: NEGATIVE
Nitrite, UA: NEGATIVE
Protein, UA: NEGATIVE
Spec Grav, UA: 1.015 (ref 1.010–1.025)
Urobilinogen, UA: 0.2 E.U./dL
pH, UA: 6 (ref 5.0–8.0)

## 2022-03-20 LAB — GLUCOSE, POCT (MANUAL RESULT ENTRY): POC Glucose: 169 mg/dl — AB (ref 70–99)

## 2022-03-20 NOTE — Progress Notes (Signed)
I,Joseline E Rosas,acting as a scribe for [employer], MD.,have documented all relevant documentation on the behalf of Eulis Foster, MD,as directed by  Eulis Foster, MD while in the presence of Eulis Foster, MD.   Established patient visit   Patient: Darius Parker   DOB: 07/07/1945   76 y.o. Male  MRN: [id number] Visit Date: 03/20/2022  Today's healthcare provider: Eulis Foster, MD   Chief Complaint  Patient presents with   Urinary Frequency   Shortness of Breath   Subjective    Urinary Frequency  This is a new problem. The current episode started in the past 7 days (2 days ago). The problem occurs every urination. The problem has been unchanged. The pain is at a severity of 0/10. The patient is experiencing no pain. There has been no fever. Associated symptoms include frequency. Pertinent negatives include no chills, discharge or urgency. Associated symptoms comments: Dry mouth. He has tried nothing for the symptoms.  Shortness of Breath This is a new problem. The current episode started 1 to 4 weeks ago (a week ago). The problem occurs daily. The problem has been unchanged. The average episode lasts 2 minutes. Associated symptoms include rhinorrhea. Pertinent negatives include no chest pain, fever, headaches, syncope or wheezing. The symptoms are aggravated by any activity and exercise (walking to the mail box). He has tried nothing for the symptoms.    Dry mouth Patient reports that he has been experiencing dry mouth.  He denies any changes to his medication within the last 3 weeks.  He states that he often has to wake in the middle of the night to have a drink of water or use mouth rinse to try to relieve his symptoms.  He denies sensation of dry eyes.  He denies dysphagia.  He denies sore throat.  Medications: Outpatient Medications Prior to Visit  Medication Sig   aspirin 81 MG chewable tablet 162 mg.   atorvastatin  (LIPITOR) 10 MG tablet Take 10 mg at bedtime by mouth.   carvedilol (COREG) 12.5 MG tablet Take 12.5 mg by mouth 2 (two) times daily with a meal.   enalapril (VASOTEC) 10 MG tablet Take 10 mg by mouth 2 (two) times daily.   famotidine (PEPCID) 20 MG tablet TAKE 1 TABLET BY MOUTH TWICE A DAY   furosemide (LASIX) 20 MG tablet Take 20 mg by mouth daily.   levothyroxine (SYNTHROID) 25 MCG tablet TAKE 1 TABLET BY MOUTH EVERY DAY   meloxicam (MOBIC) 15 MG tablet Take 15 mg by mouth daily.   omega-3 acid ethyl esters (LOVAZA) 1 g capsule Take 1 capsule (1 g total) by mouth 2 (two) times daily.   tamsulosin (FLOMAX) 0.4 MG CAPS capsule Take 1 capsule (0.4 mg total) by mouth daily.   No facility-administered medications prior to visit.    Review of Systems  Constitutional:  Negative for chills and fever.  HENT:  Positive for rhinorrhea.   Respiratory:  Positive for shortness of breath. Negative for wheezing.   Cardiovascular:  Negative for chest pain and syncope.  Genitourinary:  Positive for frequency. Negative for urgency.  Neurological:  Negative for headaches.       Objective    BP 116/71 (BP Location: Left Arm, Patient Position: Sitting, Cuff Size: Normal)   Pulse 84   Resp 18   Ht '6\' 2"'$  (1.88 m)   Wt 191 lb 4.8 oz (86.8 kg)   SpO2 97%   BMI 24.56 kg/m    Physical  Exam Constitutional:      General: He is not in acute distress.    Appearance: He is well-developed. He is not ill-appearing, toxic-appearing or diaphoretic.     Interventions: He is not intubated. HENT:     Mouth/Throat:     Mouth: Mucous membranes are dry. No injury.     Dentition: Abnormal dentition. Dental caries present.     Pharynx: Uvula midline. No oropharyngeal exudate or posterior oropharyngeal erythema.     Tonsils: No tonsillar exudate.  Cardiovascular:     Rate and Rhythm: Normal rate and regular rhythm.  Pulmonary:     Effort: Pulmonary effort is normal. No tachypnea, accessory muscle usage or  respiratory distress. He is not intubated.     Breath sounds: No stridor. Examination of the right-middle field reveals decreased breath sounds. Examination of the left-middle field reveals decreased breath sounds. Examination of the right-lower field reveals decreased breath sounds and rales. Examination of the left-lower field reveals decreased breath sounds and rales. Decreased breath sounds and rales present. No wheezing.     Comments: Able to speak in full sentences Mildly breathless with prolonged speech  Abdominal:     General: There is distension.     Palpations: Abdomen is soft. There is no mass.     Tenderness: There is no abdominal tenderness.     Hernia: A hernia is present.     Comments: Reducible umbilical hernia   Musculoskeletal:        General: No swelling.     Right lower leg: Edema present.     Left lower leg: Edema present.     Comments: Trace bilateral LE edema    Neurological:     Mental Status: He is alert and oriented to person, place, and time.     Motor: No weakness.     Gait: Gait normal.       Results for orders placed or performed in visit on 03/20/22  POCT urinalysis dipstick  Result Value Ref Range   Color, UA Yellow    Clarity, UA Clear    Glucose, UA Negative Negative   Bilirubin, UA Negative    Ketones, UA Negative    Spec Grav, UA 1.015 1.010 - 1.025   Blood, UA Negative    pH, UA 6.0 5.0 - 8.0   Protein, UA Negative Negative   Urobilinogen, UA 0.2 0.2 or 1.0 E.U./dL   Nitrite, UA Negative    Leukocytes, UA Negative Negative   Appearance     Odor    POCT glucose (manual entry)  Result Value Ref Range   POC Glucose 169 (A) 70 - 99 mg/dl    Assessment & Plan     Problem List Items Addressed This Visit       Digestive   Dry mouth    No new medication changes Suspect that this is related to dehydration as patient exhibited skin tenting on exam Recommended over-the-counter artificial saliva agents to help with symptoms        Relevant Orders   POCT glucose (manual entry) (Completed)   Comprehensive metabolic panel   TSH + free T4   Hemoglobin A1c     Other   SOB (shortness of breath) on exertion - Primary    Acute problem Suspect that this could be related to dehydration or hypervolemia although patient show signs of dehydration with dry mouth and skin tenting Will collect CMP, BNP, CBC to assess for anemia, We will send patient for chest x-ray  to evaluate for structural causes of shortness of breath EKG did not show changes from comparison 1 year ago  Blood glucose 169, we will measure hemoglobin A1c we will also measure a TSH given patient's history of hypothyroidism    Indication/Reason for EKG: Shortness of breath Comparison to prior EKG: Similar, no new changes Findings: Left bundle branch block(previously documented), normal heart rate, no ST changes to indicate acute ischemia Conclusion: Chest x-ray is stable in comparison to previous Date: 03/20/22   Eulis Foster, MD        Relevant Orders   EKG 12-Lead   DG Chest 2 View   Comprehensive metabolic panel   CBC   TSH + free T4   B Nat Peptide   Frequent urination   Relevant Orders   POCT urinalysis dipstick (Completed)   POCT glucose (manual entry) (Completed)   Comprehensive metabolic panel   Hemoglobin A1c     Return in about 2 weeks (around 04/03/2022) for dry mouth, SOB.     I, Eulis Foster, MD, have reviewed all documentation for this visit. The documentation on 03/20/22 for the exam, diagnosis, procedures, and orders are all accurate and complete.  Portions of this information were initially documented by the CMA and reviewed by me for thoroughness and accuracy.      Eulis Foster, MD  [facility] [phone] (phone) [phone] (fax)  [place]

## 2022-03-20 NOTE — Telephone Encounter (Signed)
Message from Roslynn Amble sent at 03/20/2022 10:28 AM EDT  Summary: Dry mouth/Frequent urination   The patient called in stating he has been experiencing a dry mouth and frequent urination for about 2 weeks. He also says his voice has been high. He has an appointment with his provider on 11/13 and wants to know if he said get his sugar tested before then. Please assist patient further.         Second attempt to call pt. Unable to LM, no VM.

## 2022-03-20 NOTE — Assessment & Plan Note (Addendum)
Acute problem Suspect that this could be related to dehydration or hypervolemia although patient show signs of dehydration with dry mouth and skin tenting Will collect CMP, BNP, CBC to assess for anemia, We will send patient for chest x-ray to evaluate for structural causes of shortness of breath EKG did not show changes from comparison 1 year ago  Blood glucose 169, we will measure hemoglobin A1c we will also measure a TSH given patient's history of hypothyroidism    Indication/Reason for EKG: Shortness of breath Comparison to prior EKG: Similar, no new changes Findings: Left bundle branch block(previously documented), normal heart rate, no ST changes to indicate acute ischemia Conclusion: Chest x-ray is stable in comparison to previous Date: 03/20/22   Eulis Foster, MD

## 2022-03-20 NOTE — Assessment & Plan Note (Signed)
No new medication changes Suspect that this is related to dehydration as patient exhibited skin tenting on exam Recommended over-the-counter artificial saliva agents to help with symptoms

## 2022-03-20 NOTE — Patient Instructions (Addendum)
We will collect lab work today to try to gain more information as to what may be causing your dry mouth, urine changes and Shortness of breath   We will notify you of abnormal results   Please use over-the-counter artificial saliva agents including biotene mouth wash or salivea.     Please report to [facility] located at:  [place], [place]  You do not need an appointment to have xrays completed.   Our office will follow up with  results once available.  Please plan to follow up with Dr. Caryn Section as previously scheduled    COMMON BRAND NAME(S): biotene Dry Mouth, biotene PBF Dry Mouth, Blank, Denta 5000 Plus, EtheDent, Morning Fresh, PreviDent 5000 Booster, PreviDent 5000 Booster Plus, PreviDent 5000 Dry Mouth, PreviDent 5000 ORTHO DEFENSE, PreviDent 5000 Plus, SF 5000 Plus, Pablo Ledger, [place] Sodium Fluoride, [employer]

## 2022-03-20 NOTE — Telephone Encounter (Signed)
Message from Roslynn Amble sent at 03/20/2022 10:28 AM EDT  Summary: Dry mouth/Frequent urination   The patient called in stating he has been experiencing a dry mouth and frequent urination for about 2 weeks. He also says his voice has been high. He has an appointment with his provider on 11/13 and wants to know if he said get his sugar tested before then. Please assist patient further.         Called pt, unable to LM. No answer and no VM.

## 2022-03-20 NOTE — Telephone Encounter (Signed)
Message from Roslynn Amble sent at 03/20/2022 10:28 AM EDT  Summary: Dry mouth/Frequent urination   The patient called in stating he has been experiencing a dry mouth and frequent urination for about 2 weeks. He also says his voice has been high. He has an appointment with his provider on 11/13 and wants to know if he said get his sugar tested before then. Please assist patient further.          Chief Complaint: dry mouth, frequent urination Symptoms: higher pitch to voice, SOB with exertion, dry mouth Frequency: 2 days Pertinent Negatives: Patient denies blood in urine, fever, flank pain, pain with urination Disposition: '[]'$ ED /'[]'$ Urgent Care (no appt availability in office) / '[x]'$ Appointment(In office/virtual)/ '[]'$  [place] [facility]/ '[]'$ Home Care/ '[]'$ Refused Recommended Disposition /'[]'$ [facility] [facility]/ '[]'$  Follow-up with PCP Additional Notes: pt requested appt today. Appt made.  Reason for Disposition  Urinating more frequently than usual (i.e., frequency)  Answer Assessment - Initial Assessment Questions 1. SYMPTOM: "What's the main symptom you're concerned about?" (e.g., frequency, incontinence)     frequency 2. ONSET: "When did the  sx  start?"     For 2 days 3. PAIN: "Is there any pain?" If Yes, ask: "How bad is it?" (Scale: 1-10; mild, moderate, severe)     denies 4. CAUSE: "What do you think is causing the symptoms?"     High blood 5. OTHER SYMPTOMS: "Do you have any other symptoms?" (e.g., blood in urine, fever, flank pain, pain with urination)     No - higher pitch to voice, SOB with exertion - HR 110 6. PREGNANCY: "Is there any chance you are pregnant?" "When was your last menstrual period?"     N/a  Protocols used: Urinary Symptoms-A-AH

## 2022-03-21 ENCOUNTER — Inpatient Hospital Stay
Admission: EM | Admit: 2022-03-21 | Discharge: 2022-03-24 | DRG: 375 | Disposition: A | Discharge status: Home Health | Payer: PPO | Attending: Internal Medicine | Admitting: Internal Medicine

## 2022-03-21 ENCOUNTER — Encounter: Payer: Self-pay | Admitting: Internal Medicine

## 2022-03-21 ENCOUNTER — Emergency Department: Payer: PPO

## 2022-03-21 ENCOUNTER — Other Ambulatory Visit: Payer: Self-pay

## 2022-03-21 DIAGNOSIS — R7989 Other specified abnormal findings of blood chemistry: Secondary | ICD-10-CM | POA: Diagnosis present

## 2022-03-21 DIAGNOSIS — I10 Essential (primary) hypertension: Secondary | ICD-10-CM | POA: Diagnosis present

## 2022-03-21 DIAGNOSIS — I130 Hypertensive heart and chronic kidney disease with heart failure and stage 1 through stage 4 chronic kidney disease, or unspecified chronic kidney disease: Secondary | ICD-10-CM | POA: Diagnosis present

## 2022-03-21 DIAGNOSIS — K219 Gastro-esophageal reflux disease without esophagitis: Secondary | ICD-10-CM | POA: Diagnosis present

## 2022-03-21 DIAGNOSIS — K869 Disease of pancreas, unspecified: Secondary | ICD-10-CM | POA: Diagnosis present

## 2022-03-21 DIAGNOSIS — Z8711 Personal history of peptic ulcer disease: Secondary | ICD-10-CM | POA: Diagnosis not present

## 2022-03-21 DIAGNOSIS — R972 Elevated prostate specific antigen [PSA]: Secondary | ICD-10-CM | POA: Diagnosis present

## 2022-03-21 DIAGNOSIS — R748 Abnormal levels of other serum enzymes: Secondary | ICD-10-CM | POA: Diagnosis not present

## 2022-03-21 DIAGNOSIS — K7460 Unspecified cirrhosis of liver: Secondary | ICD-10-CM | POA: Diagnosis present

## 2022-03-21 DIAGNOSIS — K8689 Other specified diseases of pancreas: Principal | ICD-10-CM

## 2022-03-21 DIAGNOSIS — R180 Malignant ascites: Secondary | ICD-10-CM | POA: Diagnosis present

## 2022-03-21 DIAGNOSIS — E039 Hypothyroidism, unspecified: Secondary | ICD-10-CM | POA: Diagnosis present

## 2022-03-21 DIAGNOSIS — N401 Enlarged prostate with lower urinary tract symptoms: Secondary | ICD-10-CM | POA: Diagnosis present

## 2022-03-21 DIAGNOSIS — Z9842 Cataract extraction status, left eye: Secondary | ICD-10-CM

## 2022-03-21 DIAGNOSIS — E875 Hyperkalemia: Secondary | ICD-10-CM | POA: Diagnosis present

## 2022-03-21 DIAGNOSIS — Z961 Presence of intraocular lens: Secondary | ICD-10-CM | POA: Diagnosis present

## 2022-03-21 DIAGNOSIS — E872 Acidosis, unspecified: Secondary | ICD-10-CM | POA: Diagnosis present

## 2022-03-21 DIAGNOSIS — N1831 Chronic kidney disease, stage 3a: Secondary | ICD-10-CM | POA: Diagnosis present

## 2022-03-21 DIAGNOSIS — Z86711 Personal history of pulmonary embolism: Secondary | ICD-10-CM

## 2022-03-21 DIAGNOSIS — C786 Secondary malignant neoplasm of retroperitoneum and peritoneum: Secondary | ICD-10-CM | POA: Diagnosis present

## 2022-03-21 DIAGNOSIS — I2489 Other forms of acute ischemic heart disease: Secondary | ICD-10-CM | POA: Diagnosis present

## 2022-03-21 DIAGNOSIS — Z6825 Body mass index (BMI) 25.0-25.9, adult: Secondary | ICD-10-CM

## 2022-03-21 DIAGNOSIS — Z7982 Long term (current) use of aspirin: Secondary | ICD-10-CM

## 2022-03-21 DIAGNOSIS — R0609 Other forms of dyspnea: Secondary | ICD-10-CM

## 2022-03-21 DIAGNOSIS — D72829 Elevated white blood cell count, unspecified: Secondary | ICD-10-CM | POA: Diagnosis present

## 2022-03-21 DIAGNOSIS — N179 Acute kidney failure, unspecified: Secondary | ICD-10-CM | POA: Diagnosis present

## 2022-03-21 DIAGNOSIS — Z79899 Other long term (current) drug therapy: Secondary | ICD-10-CM

## 2022-03-21 DIAGNOSIS — N400 Enlarged prostate without lower urinary tract symptoms: Secondary | ICD-10-CM | POA: Diagnosis present

## 2022-03-21 DIAGNOSIS — K669 Disorder of peritoneum, unspecified: Secondary | ICD-10-CM | POA: Diagnosis present

## 2022-03-21 DIAGNOSIS — R682 Dry mouth, unspecified: Secondary | ICD-10-CM | POA: Diagnosis present

## 2022-03-21 DIAGNOSIS — I2510 Atherosclerotic heart disease of native coronary artery without angina pectoris: Secondary | ICD-10-CM | POA: Diagnosis present

## 2022-03-21 DIAGNOSIS — Z9841 Cataract extraction status, right eye: Secondary | ICD-10-CM

## 2022-03-21 DIAGNOSIS — E782 Mixed hyperlipidemia: Secondary | ICD-10-CM | POA: Diagnosis present

## 2022-03-21 DIAGNOSIS — R630 Anorexia: Secondary | ICD-10-CM | POA: Diagnosis present

## 2022-03-21 DIAGNOSIS — C801 Malignant (primary) neoplasm, unspecified: Secondary | ICD-10-CM | POA: Diagnosis present

## 2022-03-21 DIAGNOSIS — I5022 Chronic systolic (congestive) heart failure: Secondary | ICD-10-CM | POA: Diagnosis present

## 2022-03-21 DIAGNOSIS — Z887 Allergy status to serum and vaccine status: Secondary | ICD-10-CM

## 2022-03-21 DIAGNOSIS — Z79890 Hormone replacement therapy (postmenopausal): Secondary | ICD-10-CM

## 2022-03-21 LAB — COMPREHENSIVE METABOLIC PANEL
ALT: 42 IU/L (ref 0–44)
ALT: 38 U/L (ref 0–44)
AST: 37 IU/L (ref 0–40)
AST: 41 U/L (ref 15–41)
Albumin/Globulin Ratio: 1.3 (ref 1.2–2.2)
Albumin: 3.9 g/dL (ref 3.8–4.8)
Albumin: 3.2 g/dL — ABNORMAL LOW (ref 3.5–5.0)
Alkaline Phosphatase: 339 IU/L — ABNORMAL HIGH (ref 44–121)
Alkaline Phosphatase: 272 U/L — ABNORMAL HIGH (ref 38–126)
Anion gap: 11 (ref 5–15)
BUN/Creatinine Ratio: 25 — ABNORMAL HIGH (ref 10–24)
BUN: 58 mg/dL — ABNORMAL HIGH (ref 8–27)
BUN: 61 mg/dL — ABNORMAL HIGH (ref 8–23)
Bilirubin Total: 0.5 mg/dL (ref 0.0–1.2)
CO2: 19 mmol/L — ABNORMAL LOW (ref 20–29)
CO2: 21 mmol/L — ABNORMAL LOW (ref 22–32)
Calcium: 9.4 mg/dL (ref 8.6–10.2)
Calcium: 9.1 mg/dL (ref 8.9–10.3)
Chloride: 108 mmol/L — ABNORMAL HIGH (ref 96–106)
Chloride: 110 mmol/L (ref 98–111)
Creatinine, Ser: 2.31 mg/dL — ABNORMAL HIGH (ref 0.76–1.27)
Creatinine, Ser: 2.14 mg/dL — ABNORMAL HIGH (ref 0.61–1.24)
GFR, Estimated: 31 mL/min — ABNORMAL LOW (ref >60)
Globulin, Total: 3.1 g/dL (ref 1.5–4.5)
Glucose, Bld: 127 mg/dL — ABNORMAL HIGH (ref 70–99)
Glucose: 146 mg/dL — ABNORMAL HIGH (ref 70–99)
Potassium: 5.6 mmol/L — ABNORMAL HIGH (ref 3.5–5.2)
Potassium: 5.3 mmol/L — ABNORMAL HIGH (ref 3.5–5.1)
Sodium: 143 mmol/L (ref 134–144)
Sodium: 142 mmol/L (ref 135–145)
Total Bilirubin: 0.9 mg/dL (ref 0.3–1.2)
Total Protein: 7 g/dL (ref 6.0–8.5)
Total Protein: 7.3 g/dL (ref 6.5–8.1)
eGFR: 28 mL/min/{1.73_m2} — ABNORMAL LOW (ref >59)

## 2022-03-21 LAB — CBC
HCT: 37.4 % — ABNORMAL LOW (ref 39.0–52.0)
Hematocrit: 36.1 % — ABNORMAL LOW (ref 37.5–51.0)
Hemoglobin: 11.4 g/dL — ABNORMAL LOW (ref 13.0–17.7)
Hemoglobin: 12.1 g/dL — ABNORMAL LOW (ref 13.0–17.0)
MCH: 29.2 pg (ref 26.6–33.0)
MCH: 30 pg (ref 26.0–34.0)
MCHC: 31.6 g/dL (ref 31.5–35.7)
MCHC: 32.4 g/dL (ref 30.0–36.0)
MCV: 93 fL (ref 79–97)
MCV: 92.8 fL (ref 80.0–100.0)
Platelets: 128 10*3/uL — ABNORMAL LOW (ref 150–450)
Platelets: 160 10*3/uL (ref 150–400)
RBC: 3.9 x10E6/uL — ABNORMAL LOW (ref 4.14–5.80)
RBC: 4.03 MIL/uL — ABNORMAL LOW (ref 4.22–5.81)
RDW: 12.1 % (ref 11.6–15.4)
RDW: 12.9 % (ref 11.5–15.5)
WBC: 14.7 10*3/uL — ABNORMAL HIGH (ref 3.4–10.8)
WBC: 17.2 10*3/uL — ABNORMAL HIGH (ref 4.0–10.5)
nRBC: 0 % (ref 0.0–0.2)

## 2022-03-21 LAB — CBC WITH DIFFERENTIAL/PLATELET
Abs Immature Granulocytes: 0.19 10*3/uL — ABNORMAL HIGH (ref 0.00–0.07)
Basophils Absolute: 0.1 10*3/uL (ref 0.0–0.1)
Basophils Relative: 1 %
Eosinophils Absolute: 0.3 10*3/uL (ref 0.0–0.5)
Eosinophils Relative: 2 %
HCT: 33.7 % — ABNORMAL LOW (ref 39.0–52.0)
Hemoglobin: 11.2 g/dL — ABNORMAL LOW (ref 13.0–17.0)
Immature Granulocytes: 1 %
Lymphocytes Relative: 15 %
Lymphs Abs: 2.7 10*3/uL (ref 0.7–4.0)
MCH: 29.7 pg (ref 26.0–34.0)
MCHC: 33.2 g/dL (ref 30.0–36.0)
MCV: 89.4 fL (ref 80.0–100.0)
Monocytes Absolute: 1.1 10*3/uL — ABNORMAL HIGH (ref 0.1–1.0)
Monocytes Relative: 6 %
Neutro Abs: 13.5 10*3/uL — ABNORMAL HIGH (ref 1.7–7.7)
Neutrophils Relative %: 75 %
Platelets: 156 10*3/uL (ref 150–400)
RBC: 3.77 MIL/uL — ABNORMAL LOW (ref 4.22–5.81)
RDW: 12.9 % (ref 11.5–15.5)
WBC: 17.9 10*3/uL — ABNORMAL HIGH (ref 4.0–10.5)
nRBC: 0 % (ref 0.0–0.2)

## 2022-03-21 LAB — TSH+FREE T4
Free T4: 1.29 ng/dL (ref 0.82–1.77)
TSH: 4.03 u[IU]/mL (ref 0.450–4.500)

## 2022-03-21 LAB — HEMOGLOBIN A1C
Est. average glucose Bld gHb Est-mCnc: 131 mg/dL
Hgb A1c MFr Bld: 6.2 % — ABNORMAL HIGH (ref 4.8–5.6)

## 2022-03-21 LAB — TROPONIN I (HIGH SENSITIVITY)
Troponin I (High Sensitivity): 36 ng/L — ABNORMAL HIGH (ref <18)
Troponin I (High Sensitivity): 37 ng/L — ABNORMAL HIGH (ref <18)

## 2022-03-21 LAB — BRAIN NATRIURETIC PEPTIDE
B Natriuretic Peptide: 232 pg/mL — ABNORMAL HIGH (ref 0.0–100.0)
BNP: 143 pg/mL — ABNORMAL HIGH (ref 0.0–100.0)

## 2022-03-21 MED ORDER — ACETAMINOPHEN 325 MG PO TABS: 650.0000 mg | ORAL_TABLET | Freq: Four times a day (QID) | ORAL | Status: DC | PRN

## 2022-03-21 MED ORDER — CARVEDILOL 6.25 MG PO TABS
12.5000 mg | ORAL_TABLET | Freq: Two times a day (BID) | ORAL | Status: DC
  Administered 2022-03-22 – 2022-03-24 (×5): 12.5 mg via ORAL
  Filled 2022-03-21 (×5): qty 2

## 2022-03-21 MED ORDER — FAMOTIDINE 20 MG PO TABS
20.0000 mg | ORAL_TABLET | Freq: Two times a day (BID) | ORAL | Status: DC
  Administered 2022-03-22: 20 mg via ORAL
  Filled 2022-03-21: qty 1

## 2022-03-21 MED ORDER — ASPIRIN 81 MG PO CHEW
162.0000 mg | CHEWABLE_TABLET | Freq: Every day | ORAL | Status: DC
  Administered 2022-03-22 – 2022-03-24 (×3): 162 mg via ORAL
  Filled 2022-03-21 (×3): qty 2

## 2022-03-21 MED ORDER — HEPARIN SODIUM (PORCINE) 5000 UNIT/ML IJ SOLN
5000.0000 [IU] | Freq: Three times a day (TID) | INTRAMUSCULAR | Status: DC
  Administered 2022-03-22 – 2022-03-24 (×6): 5000 [IU] via SUBCUTANEOUS
  Filled 2022-03-21 (×6): qty 1

## 2022-03-21 MED ORDER — SENNOSIDES-DOCUSATE SODIUM 8.6-50 MG PO TABS: 1.0000 | ORAL_TABLET | Freq: Every evening | ORAL | Status: DC | PRN

## 2022-03-21 MED ORDER — LEVOTHYROXINE SODIUM 25 MCG PO TABS
25.0000 ug | ORAL_TABLET | Freq: Every day | ORAL | Status: DC
  Administered 2022-03-22 – 2022-03-24 (×2): 25 ug via ORAL
  Filled 2022-03-21 (×3): qty 1

## 2022-03-21 MED ORDER — ACETAMINOPHEN 650 MG RE SUPP: 650.0000 mg | Freq: Four times a day (QID) | RECTAL | Status: DC | PRN

## 2022-03-21 MED ORDER — ATORVASTATIN CALCIUM 20 MG PO TABS
10.0000 mg | ORAL_TABLET | Freq: Every day | ORAL | Status: DC
  Filled 2022-03-21: qty 1

## 2022-03-21 MED ORDER — MELATONIN 5 MG PO TABS: 5.0000 mg | ORAL_TABLET | Freq: Every evening | ORAL | Status: DC | PRN

## 2022-03-21 MED ORDER — ONDANSETRON HCL 4 MG/2ML IJ SOLN: 4.0000 mg | Freq: Four times a day (QID) | INTRAMUSCULAR | Status: DC | PRN

## 2022-03-21 MED ORDER — ONDANSETRON HCL 4 MG PO TABS: 4.0000 mg | ORAL_TABLET | Freq: Four times a day (QID) | ORAL | Status: DC | PRN

## 2022-03-21 MED ORDER — HYDRALAZINE HCL 20 MG/ML IJ SOLN: 5.0000 mg | Freq: Three times a day (TID) | INTRAMUSCULAR | Status: DC | PRN

## 2022-03-21 MED ORDER — SODIUM CHLORIDE 0.9 % IV SOLN: Freq: Once | INTRAVENOUS | Status: AC

## 2022-03-21 MED ORDER — SODIUM CHLORIDE 0.9 % IV BOLUS
250.0000 mL | Freq: Once | INTRAVENOUS | Status: AC
  Administered 2022-03-21: 250 mL via INTRAVENOUS

## 2022-03-21 NOTE — Assessment & Plan Note (Signed)
-   Strict I's and O's - Complete echo ordered

## 2022-03-21 NOTE — Hospital Course (Signed)
Mr. Micha Dosanjh is a 76 year old male with history of hypertension, GERD, hyperlipidemia, hypothyroid, BPH, who presents to the emergency department for chief concerns of abnormal labs at the urging of his PCP.  Initial vitals in the emergency department showed temperature  Respiration rate of 18, heart rate of 78, blood pressure 122/71, SPO2 of 98% on room air.  Serum sodium is 142, potassium 5.3, chloride 110, bicarb 21, BUN of 61, serum creatinine of 2.14, GFR of 31, nonfasting blood glucose 127, WBC 17.9, hemoglobin 11.2, platelets of 156.  BNP is 232.  High sensitive troponin is 36 and on recheck was 37.  ED treatment: None.

## 2022-03-21 NOTE — ED Triage Notes (Signed)
Pt to ED via POV from home. Pt referred by PCP for abnormal labs. Pt was seen by PCP due to dry mouth, SOB with exertion and frequent urination.   Abnormal labs BUN 58 Cr 2.31 BNP 143 WBC 14.7 K+ 5.6

## 2022-03-21 NOTE — Assessment & Plan Note (Addendum)
-   With small malignant ascites and extensive omental implants consistent with metastatic disease - Primary carcinoma unknown at this time - AM team to consider consult oncology for further recommendations

## 2022-03-21 NOTE — Assessment & Plan Note (Addendum)
-   Etiology work-up in progress, differentials include prerenal secondary to poor p.o. intake versus cardiorenal - Sodium chloride 250 mL bolus ordered once - Avoid nephrotoxic agents - Recheck BMP in the a.m., if patient does not improve, would consider cardiorenal and a.m. team may consider consultation to nephrology

## 2022-03-21 NOTE — H&P (Signed)
History and Physical   Darius Parker BSJ:[id number] DOB: 1945-08-02 DOA: 03/21/2022  PCP: Birdie Sons, MD  Outpatient Specialists: Dr. Allen Norris Patient coming from: Coming from home  I have personally briefly reviewed patient's old medical records in [place].  Chief Concern: Normal labs  HPI: Darius Parker is a 76 year old male with history of hypertension, GERD, hyperlipidemia, hypothyroid, BPH, who presents to the emergency department for chief concerns of abnormal labs at the urging of his PCP.  Initial vitals in the emergency department showed temperature  Respiration rate of 18, heart rate of 78, blood pressure 122/71, SPO2 of 98% on room air.  Serum sodium is 142, potassium 5.3, chloride 110, bicarb 21, BUN of 61, serum creatinine of 2.14, GFR of 31, nonfasting blood glucose 127, WBC 17.9, hemoglobin 11.2, platelets of 156.  BNP is 232.  High sensitive troponin is 36 and on recheck was 37.  ED treatment: None.  At bedside patient is able to tell me his name, his age, he knows he is in the hospital.  He reports that over the last couple of months he has been feeling increasing weakness and tiredness.  He also endorses worsening shortness of breath with exertion going from his house to the mailbox.  His primary care doctor ordered labs and he was told that the labs were abnormal and his PCP prompted him to present to the emergency department for further evaluation.  He endorses poor appetite.  He denies known weight loss.  He states his weight has been pretty stable.  He denies chest pain, fever, chills, nausea, vomiting, abdominal pain, dysuria, hematuria, diarrhea, blood in his stools, black stools.  Social history: He denies tobacco, EtOH, recreational drug use.  He is retired and formerly worked in Water engineer at [employer].  ROS: Constitutional: no weight change, no fever ENT/Mouth: no sore throat, no rhinorrhea Eyes: no eye pain, no vision  changes Cardiovascular: no chest pain, no dyspnea,  no edema, no palpitations Respiratory: no cough, no sputum, no wheezing Gastrointestinal: no nausea, no vomiting, no diarrhea, no constipation Genitourinary: no urinary incontinence, no dysuria, no hematuria Musculoskeletal: no arthralgias, no myalgias Skin: no skin lesions, no pruritus, Neuro: + weakness, no loss of consciousness, no syncope Psych: no anxiety, no depression, + decrease appetite Heme/Lymph: no bruising, no bleeding  ED Course: Discussed with emergency medicine provider, patient requiring hospitalization for chief concerns of acute kidney injury and hyperkalemia.  Assessment/Plan  Principal Problem:   AKI (acute kidney injury) ([place]) Active Problems:   Essential hypertension   Chronic systolic CHF (congestive heart failure), NYHA class 2 (HCC)   Hyperlipidemia, mixed   Coronary artery disease involving native coronary artery of native heart without angina pectoris   Elevated PSA   Leukocytosis   Exertional dyspnea   Hyperkalemia   Elevated alkaline phosphatase level   Pancreatic mass   BPH (benign prostatic hyperplasia)   Elevated troponin   Elevated brain natriuretic peptide (BNP) level   Assessment and Plan:  * AKI (acute kidney injury) ([place]) - Etiology work-up in progress, differentials include prerenal secondary to poor p.o. intake versus cardiorenal - Sodium chloride 250 mL bolus ordered once - Avoid nephrotoxic agents - Recheck BMP in the a.m., if patient does not improve, would consider cardiorenal and a.m. team may consider consultation to nephrology  Elevated brain natriuretic peptide (BNP) level - Strict I's and O's - Complete echo ordered  Elevated troponin - I suspect this is secondary to demand ischemia in  setting of chronic heart failure with acute kidney injury - Continue to monitor for chest pain  Pancreatic mass - With small malignant ascites and extensive omental implants consistent  with metastatic disease - Primary carcinoma unknown at this time - AM team to consider consult oncology for further recommendations  Elevated alkaline phosphatase level - Presumed secondary to carcinoma  Hyperkalemia - Presumed secondary to acute kidney injury - Sodium chloride 250 mL bolus ordered - Recheck in the a.m.  Exertional dyspnea - Patient's prior echo was on 07/21/2019 which showed closest ejection fraction estimated at 25% - Strict I's and  Leukocytosis - Etiology work-up in progress - Check CBC in a.m.  Chart reviewed.   DVT prophylaxis: Heparin 5000 units subcutaneous every 8 hours Code Status: Full code Diet: Heart healthy Family Communication: No Disposition Plan: Pending clinical course Consults called: None at this time Admission status: Telemetry medical, inpatient  Past Medical History:  Diagnosis Date   Arthritis    Bursitis    CAP (community acquired pneumonia) 02/08/2018   Dysrhythmia    GERD (gastroesophageal reflux disease)    Heart murmur    Hepatitis    Pulmonary embolism ([place]) 02/08/2018   Stomach ulcer    Past Surgical History:  Procedure Laterality Date   BACK SURGERY     CARDIAC CATHETERIZATION     CATARACT EXTRACTION W/PHACO Right 03/01/2016   Procedure: CATARACT EXTRACTION PHACO AND INTRAOCULAR LENS PLACEMENT ([place]);  Surgeon: Estill Cotta, MD;  Location: [facility];  Service: Ophthalmology;  Laterality: Right;  [place] 1.33 AP% 21.6 CDE 37.45 Fluid Pack Lot # [id number]   CATARACT EXTRACTION W/PHACO Left 08/16/2021   Procedure: CATARACT EXTRACTION PHACO AND INTRAOCULAR LENS PLACEMENT ([facility]) LEFT 10.06 00:58.7;  Surgeon: Birder Robson, MD;  Location: [facility];  Service: Ophthalmology;  Laterality: Left;  LEAVE LAST   COLONOSCOPY W/ POLYPECTOMY     ESOPHAGOGASTRODUODENOSCOPY (EGD) WITH PROPOFOL N/A 10/25/2021   Procedure: ESOPHAGOGASTRODUODENOSCOPY (EGD) WITH PROPOFOL;  Surgeon: Lucilla Lame, MD;  Location: [facility];   Service: Endoscopy;  Laterality: N/A;   TONSILLECTOMY     Social History:  reports that he has never smoked. He has never used smokeless tobacco. He reports that he does not drink alcohol and does not use drugs.  Allergies  Allergen Reactions   Tetanus Toxoids Swelling   Family History  Problem Relation Age of Onset   Diabetes Brother    Family history: Family history reviewed and not pertinent.  Prior to Admission medications   Medication Sig Start Date End Date Taking? Authorizing Provider  aspirin 81 MG chewable tablet 162 mg.    [provider]  atorvastatin (LIPITOR) 10 MG tablet Take 10 mg at bedtime by mouth. 01/21/17   [provider]  carvedilol (COREG) 12.5 MG tablet Take 12.5 mg by mouth 2 (two) times daily with a meal.    [provider]  enalapril (VASOTEC) 10 MG tablet Take 10 mg by mouth 2 (two) times daily.    [provider]  famotidine (PEPCID) 20 MG tablet TAKE 1 TABLET BY MOUTH TWICE A DAY 02/07/21   Birdie Sons, MD  furosemide (LASIX) 20 MG tablet Take 20 mg by mouth daily.    [provider]  levothyroxine (SYNTHROID) 25 MCG tablet TAKE 1 TABLET BY MOUTH EVERY DAY 08/11/21   Birdie Sons, MD  meloxicam (MOBIC) 15 MG tablet Take 15 mg by mouth daily. 04/09/21   [provider]  omega-3 acid ethyl esters (LOVAZA) 1  g capsule Take 1 capsule (1 g total) by mouth 2 (two) times daily. 12/07/21   Birdie Sons, MD  tamsulosin (FLOMAX) 0.4 MG CAPS capsule Take 1 capsule (0.4 mg total) by mouth daily. 03/15/20   Versie Starks, PA-C   Physical Exam: Vitals:   03/21/22 2100 03/21/22 2130 03/21/22 2200 03/21/22 2230  BP: 119/67 122/67 121/63 122/66  Pulse: 80 74 76 74  Resp: '14 12 16 14  '$ Temp:    97.7 F (36.5 C)  TempSrc:    Oral  SpO2: 99% 99% 97% 99%   Constitutional: appears age-appropriate, frail, NAD, calm, comfortable Eyes: PERRL, lids and conjunctivae normal ENMT: Mucous membranes are moist.  Posterior pharynx clear of any exudate or lesions. Age-appropriate dentition. Hearing appropriate Neck: normal, supple, no masses, no thyromegaly Respiratory: clear to auscultation bilaterally, no wheezing, no crackles. Normal respiratory effort. No accessory muscle use.  Cardiovascular: Regular rate and rhythm, no murmurs / rubs / gallops. No extremity edema. 2+ pedal pulses. No carotid bruits.  Abdomen: no tenderness, no masses palpated, no hepatosplenomegaly. Bowel sounds positive.  Musculoskeletal: no clubbing / cyanosis. No joint deformity upper and lower extremities. Good ROM, no contractures, no atrophy. Normal muscle tone.  Skin: no rashes, lesions, ulcers. No induration Neurologic: Sensation intact. Strength 5/5 in all 4.  Psychiatric: Normal judgment and insight. Alert and oriented x 3. Normal mood.   EKG: independently reviewed, showing sinus rhythm with rate of 79, QTc 495  Chest x-ray on Admission: I personally reviewed and I agree with radiologist reading as below.  CT ABDOMEN PELVIS WO CONTRAST  Result Date: 03/21/2022 CLINICAL DATA:  Abdominal pain. EXAM: CT ABDOMEN AND PELVIS WITHOUT CONTRAST TECHNIQUE: Multidetector CT imaging of the abdomen and pelvis was performed following the standard protocol without IV contrast. RADIATION DOSE REDUCTION: This exam was performed according to the departmental dose-optimization program which includes automated exposure control, adjustment of the mA and/or kV according to patient size and/or use of iterative reconstruction technique. COMPARISON:  None Available. FINDINGS: Evaluation of this exam is limited in the absence of intravenous contrast. Lower chest: Bibasilar subpleural thickening or possible trace effusion. There is lung bases are otherwise clear. No intra-abdominal free air.  Small ascites. Hepatobiliary: Cirrhosis. There is small stone and sludge within the gallbladder. Pancreas: There is an ill-defined mass in the body of the  pancreas measuring approximately 5.3 x 3.1 cm (23/2). There is atrophy of the body and tail of the pancreas. Spleen: Several small calcified splenic granuloma. Adrenals/Urinary Tract: The adrenal glands are unremarkable. Mild bilateral renal parenchyma atrophy. Left renal cysts measure up to 2 cm. Additional bilateral renal hypodense lesions are too small to characterize. There is no hydronephrosis or nephrolithiasis on either side. The visualized ureters and urinary bladder appear unremarkable. Stomach/Bowel: There is sigmoid diverticulosis. There is no bowel obstruction. The appendix is not identified with certainty. Vascular/Lymphatic: Mild aortoiliac atherosclerotic disease. The IVC is unremarkable. No portal venous gas. No retroperitoneal adenopathy. Reproductive: The prostate gland is enlarged measuring 6.3 cm in transverse axial diameter. Other: Extensive omental nodularity and caking consistent with implants. Scattered areas of nodularity also noted in the mesentery primarily in the right hemiabdomen. Musculoskeletal: Osteopenia with degenerative changes of the spine. No acute osseous pathology. Small fat containing umbilical hernia. IMPRESSION: 1. Small malignant ascites and extensive omental implants consistent with metastatic disease. 2. Ill-defined mass in the body of the pancreas, concerning for malignancy. Further evaluation with MRI with and without contrast recommended. 3. Cirrhosis with  small ascites. 4. Cholelithiasis. 5. Sigmoid diverticulosis. No bowel obstruction. 6. Enlarged prostate gland. 7.  Aortic Atherosclerosis (ICD10-I70.0). Electronically Signed   By: Anner Crete M.D.   On: 03/21/2022 20:43   DG Chest 2 View  Result Date: 03/21/2022 CLINICAL DATA:  Shortness of breath. Shortness of breath on exertion. EXAM: CHEST - 2 VIEW COMPARISON:  12/21/2019, CT 02/08/2018 FINDINGS: Normal heart size and mediastinal contours. Calcified pleural plaques are similar to prior exam. There also  calcified mediastinal lymph nodes. No pulmonary edema. No acute airspace disease. No pleural effusion or pneumothorax. No acute osseous abnormalities are seen. IMPRESSION: 1. No acute chest findings. 2. Calcified pleural plaques and calcified mediastinal lymph nodes, unchanged from prior exam. Electronically Signed   By: Keith Rake M.D.   On: 03/21/2022 18:19   DG Chest 2 View  Result Date: 03/21/2022 CLINICAL DATA:  Shortness of breath. EXAM: CHEST - 2 VIEW COMPARISON:  Chest radiograph yesterday. Most recent chest CT 02/08/2018 FINDINGS: The heart is normal in size. Normal mediastinal contours. Calcified mediastinal lymph nodes. Nodular densities projecting over the lung bases are likely nipple shadows. There also chronic calcified pleural plaques. No pulmonary edema. No confluent consolidation, pleural effusion or pneumothorax. No acute osseous abnormalities are seen. IMPRESSION: 1. No acute chest findings. 2. Similar radiographic appearance of calcified pleural plaques. 3. Nodular densities projecting over the lung bases are likely nipple shadows. Electronically Signed   By: Keith Rake M.D.   On: 03/21/2022 18:18    Labs on Admission: I have personally reviewed following labs  CBC: Recent Labs  Lab 03/20/22 1452 03/21/22 1755 03/21/22 2004  WBC 14.7* 17.2* 17.9*  NEUTROABS  --   --  13.5*  HGB 11.4* 12.1* 11.2*  HCT 36.1* 37.4* 33.7*  MCV 93 92.8 89.4  PLT 128* 160 295   Basic Metabolic Panel: Recent Labs  Lab 03/20/22 1452 03/21/22 1755  NA 143 142  K 5.6* 5.3*  CL 108* 110  CO2 19* 21*  GLUCOSE 146* 127*  BUN 58* 61*  CREATININE 2.31* 2.14*  CALCIUM 9.4 9.1   GFR: Estimated Creatinine Clearance: 33.6 mL/min (A) (by C-G formula based on SCr of 2.14 mg/dL (H)).  Liver Function Tests: Recent Labs  Lab 03/20/22 1452 03/21/22 1755  AST 37 41  ALT 42 38  ALKPHOS 339* 272*  BILITOT 0.5 0.9  PROT 7.0 7.3  ALBUMIN 3.9 3.2*   HbA1C: Recent Labs     03/20/22 1452  HGBA1C 6.2*   Thyroid Function Tests: Recent Labs    03/20/22 1452  TSH 4.030  FREET4 1.29   Urine analysis:    Component Value Date/Time   COLORURINE YELLOW (A) 03/15/2020 1448   APPEARANCEUR Hazy (A) 03/18/2020 0943   LABSPEC 1.011 03/15/2020 1448   PHURINE 5.0 03/15/2020 1448   GLUCOSEU Negative 03/18/2020 0943   HGBUR NEGATIVE 03/15/2020 1448   BILIRUBINUR Negative 03/20/2022 1347   BILIRUBINUR Negative 03/18/2020 [place] 03/15/2020 1448   PROTEINUR Negative 03/20/2022 1347   PROTEINUR Negative 03/18/2020 0943   PROTEINUR NEGATIVE 03/15/2020 1448   UROBILINOGEN 0.2 03/20/2022 1347   NITRITE Negative 03/20/2022 1347   NITRITE Negative 03/18/2020 0943   NITRITE NEGATIVE 03/15/2020 1448   LEUKOCYTESUR Negative 03/20/2022 1347   LEUKOCYTESUR Trace (A) 03/18/2020 0943   LEUKOCYTESUR NEGATIVE 03/15/2020 1448   Dr. Tobie Poet [facility]  If 7PM-7AM, please contact overnight-coverage provider If 7AM-7PM, please contact day coverage provider [email or web address]  03/21/2022, 11:21 PM

## 2022-03-21 NOTE — Assessment & Plan Note (Signed)
-   Etiology work-up in progress - Check CBC in a.m.

## 2022-03-21 NOTE — ED Provider Notes (Signed)
[facility] Provider Note    Event Date/Time   First MD Initiated Contact with Patient 03/21/22 1939     (approximate)   History  Of complaint is sent for abnormal labs  HPI  Darius Parker is a 76 y.o. male who was sent by his primary care doctor for abnormal labs.  Patient went to primary care because he was having a lot of urination frequency and having dry mouth.  Lab work showed an elevated potassium of 5.3 today 5.6 yesterday CO2 is slightly low at 21 today BUN was 61 creatinine 2.14 GFR is 31 GFR was 37 in March and 44 in January 46 August 2022.  Patient's BNP today is 232 its been in the 100s previously.  Troponin today is 36.  Patient's alk phos is 272 yesterday it was 393 this is never been elevated previously.  His white count today 17 yesterday was 14 in March was normal.  Glucose was 146 yesterday 127 today.  Hemoglobin A1c yesterday was 6.2.  Past medical history significant for dilatation of what sounds like the lower esophageal sphincter.  Essential hypertension Chronic systolic CHF (congestive heart failure), NYHA class 2 (HCC) Hyperlipidemia, mixed Coronary artery disease involving native coronary artery of native heart without angina pectoris Family history of diabetes mellitus Hyperglycemia Elevated PSA Leukocytes in urine Urinary frequency Onychomycosis Dysphagia Stricture and stenosis of esophagus Chondromalacia of left patella Right shoulder pain Canker sore SOB (shortness of breath) on exertion Frequent urination Dry mouth  Physical Exam   Triage Vital Signs: ED Triage Vitals  Enc Vitals Group     BP 03/21/22 1752 122/71     Pulse Rate 03/21/22 1752 78     Resp 03/21/22 1752 18     Temp --      Temp Source 03/21/22 1803 Axillary     SpO2 03/21/22 1752 98 %     Weight --      Height --      Head Circumference --      Peak Flow --      Pain Score 03/21/22 1752 0     Pain Loc --      Pain Edu? --      Excl. in [place]? --      Most recent vital signs: Vitals:   03/21/22 2100 03/21/22 2130  BP: 119/67 122/67  Pulse: 80 74  Resp: 14 12  SpO2: 99% 99%     General: Awake, no distress.  Mouth no erythema or exudate looks moist CV:  Good peripheral perfusion.  Heart regular rate and rhythm no audible murmurs Resp:  Normal effort.  Lungs are clear Abd:  No distention.  Soft and nontender Extremities trace edema in the left leg patient reports several days ago her left leg swelled up and on the right leg swelled up a little bit.   ED Results / Procedures / Treatments   Labs (all labs ordered are listed, but only abnormal results are displayed) Labs Reviewed  CBC - Abnormal; Notable for the following components:      Result Value   WBC 17.2 (*)    RBC 4.03 (*)    Hemoglobin 12.1 (*)    HCT 37.4 (*)    All other components within normal limits  BRAIN NATRIURETIC PEPTIDE - Abnormal; Notable for the following components:   B Natriuretic Peptide 232.0 (*)    All other components within normal limits  COMPREHENSIVE METABOLIC PANEL - Abnormal; Notable for the  following components:   Potassium 5.3 (*)    CO2 21 (*)    Glucose, Bld 127 (*)    BUN 61 (*)    Creatinine, Ser 2.14 (*)    Albumin 3.2 (*)    Alkaline Phosphatase 272 (*)    GFR, Estimated 31 (*)    All other components within normal limits  CBC WITH DIFFERENTIAL/PLATELET - Abnormal; Notable for the following components:   WBC 17.9 (*)    RBC 3.77 (*)    Hemoglobin 11.2 (*)    HCT 33.7 (*)    Neutro Abs 13.5 (*)    Monocytes Absolute 1.1 (*)    Abs Immature Granulocytes 0.19 (*)    All other components within normal limits  TROPONIN I (HIGH SENSITIVITY) - Abnormal; Notable for the following components:   Troponin I (High Sensitivity) 36 (*)    All other components within normal limits  TROPONIN I (HIGH SENSITIVITY) - Abnormal; Notable for the following components:   Troponin I (High Sensitivity) 37 (*)    All other components within  normal limits  URINALYSIS, ROUTINE W REFLEX MICROSCOPIC     EKG  EKG read interpreted by me shows normal sinus rhythm rate of 79 normal axis changes consistent with left bundle branch block looks similar to EKG from January 22   RADIOLOGY Chest x-ray shows no acute changes radiology notes calcified plaques.  Radiology read the film I reviewed the film and interpreted and agree   PROCEDURES:  Critical Care performed:   Procedures   MEDICATIONS ORDERED IN ED: Medications  0.9 %  sodium chloride infusion ( Intravenous New Bag/Given 03/21/22 2023)     IMPRESSION / MDM / ASSESSMENT AND PLAN / ED COURSE  I reviewed the triage vital signs and the nursing notes.  She was CT showing pancreatic mass malignant ascites and apparent mets.  This is a new finding.  I will get him in the hospital.  Patient's presentation is most consistent with acute presentation with potential threat to life or bodily function.  The patient is on the cardiac monitor to evaluate for evidence of arrhythmia and/or significant heart rate changes.  None have been seen   FINAL CLINICAL IMPRESSION(S) / ED DIAGNOSES   Final diagnoses:  Pancreatic mass  Malignant ascites     Rx / DC Orders   ED Discharge Orders     None        Note:  This document was prepared using Dragon voice recognition software and may include unintentional dictation errors.   Nena Polio, MD 03/21/22 2206

## 2022-03-21 NOTE — ED Provider Triage Note (Signed)
  Emergency Medicine Provider Triage Evaluation Note  Darius Parker , a 76 y.o.male,  was evaluated in triage.  Pt complains of abnormal labs.  He is referred here by his PCP due to abnormal labs, including elevated creatinine 2.31, WBC of 14.7, potassium of 5.6.  Patient states that he has been having dry mouth, some shortness of breath with exertion, and frequent urination.   Review of Systems  Positive: Shortness of breath, frequent urination, dry mouth Negative: Denies fever, chest pain, vomiting  Physical Exam   Vitals:   03/21/22 1752  BP: 122/71  Pulse: 78  Resp: 18  SpO2: 98%   Gen:   Awake, no distress   Resp:  Normal effort  MSK:   Moves extremities without difficulty  Other:    Medical Decision Making  Given the patient's initial medical screening exam, the following diagnostic evaluation has been ordered. The patient will be placed in the appropriate treatment space, once one is available, to complete the evaluation and treatment. I have discussed the plan of care with the patient and I have advised the patient that an ED physician or mid-level practitioner will reevaluate their condition after the test results have been received, as the results may give them additional insight into the type of treatment they may need.    Diagnostics: EKG, labs, CXR  Treatments: none immediately   Teodoro Spray, [place] 03/21/22 1808

## 2022-03-21 NOTE — Assessment & Plan Note (Signed)
-   Patient's prior echo was on 07/21/2019 which showed closest ejection fraction estimated at 25% - Strict I's and

## 2022-03-21 NOTE — Assessment & Plan Note (Signed)
-   Presumed secondary to acute kidney injury - Sodium chloride 250 mL bolus ordered - Recheck in the a.m.

## 2022-03-21 NOTE — ED Notes (Signed)
Patient transported to CT 

## 2022-03-21 NOTE — Assessment & Plan Note (Signed)
-   I suspect this is secondary to demand ischemia in setting of chronic heart failure with acute kidney injury - Continue to monitor for chest pain

## 2022-03-21 NOTE — ED Notes (Signed)
ED Provider at bedside. 

## 2022-03-21 NOTE — Assessment & Plan Note (Addendum)
-   Presumed secondary to carcinoma

## 2022-03-22 ENCOUNTER — Inpatient Hospital Stay
Admit: 2022-03-22 | Discharge: 2022-03-22 | Disposition: A | Discharge status: Home / Self Care | Payer: PPO | Attending: Internal Medicine | Admitting: Internal Medicine

## 2022-03-22 ENCOUNTER — Inpatient Hospital Stay: Payer: PPO

## 2022-03-22 DIAGNOSIS — N179 Acute kidney failure, unspecified: Secondary | ICD-10-CM | POA: Diagnosis not present

## 2022-03-22 DIAGNOSIS — R748 Abnormal levels of other serum enzymes: Secondary | ICD-10-CM | POA: Diagnosis not present

## 2022-03-22 DIAGNOSIS — I2510 Atherosclerotic heart disease of native coronary artery without angina pectoris: Secondary | ICD-10-CM | POA: Diagnosis not present

## 2022-03-22 DIAGNOSIS — I5022 Chronic systolic (congestive) heart failure: Secondary | ICD-10-CM | POA: Diagnosis not present

## 2022-03-22 LAB — BASIC METABOLIC PANEL
Anion gap: 7 (ref 5–15)
BUN: 60 mg/dL — ABNORMAL HIGH (ref 8–23)
CO2: 19 mmol/L — ABNORMAL LOW (ref 22–32)
Calcium: 8.3 mg/dL — ABNORMAL LOW (ref 8.9–10.3)
Chloride: 114 mmol/L — ABNORMAL HIGH (ref 98–111)
Creatinine, Ser: 2.01 mg/dL — ABNORMAL HIGH (ref 0.61–1.24)
GFR, Estimated: 34 mL/min — ABNORMAL LOW (ref >60)
Glucose, Bld: 120 mg/dL — ABNORMAL HIGH (ref 70–99)
Potassium: 5.5 mmol/L — ABNORMAL HIGH (ref 3.5–5.1)
Sodium: 140 mmol/L (ref 135–145)

## 2022-03-22 LAB — CBC
HCT: 29.2 % — ABNORMAL LOW (ref 39.0–52.0)
Hemoglobin: 9.8 g/dL — ABNORMAL LOW (ref 13.0–17.0)
MCH: 29.9 pg (ref 26.0–34.0)
MCHC: 33.6 g/dL (ref 30.0–36.0)
MCV: 89 fL (ref 80.0–100.0)
Platelets: 104 10*3/uL — ABNORMAL LOW (ref 150–400)
RBC: 3.28 MIL/uL — ABNORMAL LOW (ref 4.22–5.81)
RDW: 12.7 % (ref 11.5–15.5)
WBC: 11.8 10*3/uL — ABNORMAL HIGH (ref 4.0–10.5)
nRBC: 0 % (ref 0.0–0.2)

## 2022-03-22 LAB — ECHOCARDIOGRAM COMPLETE
AR max vel: 2.62 cm2
AV Area VTI: 2.89 cm2
AV Area mean vel: 2.2 cm2
AV Mean grad: 3 mmHg
AV Peak grad: 4.4 mmHg
Ao pk vel: 1.05 m/s
Area-P 1/2: 5.7 cm2
Calc EF: 17 %
Height: 74 in
S' Lateral: 4.3 cm
Single Plane A2C EF: 17.3 %
Single Plane A4C EF: 18.5 %
Weight: 3118.19 oz

## 2022-03-22 LAB — PSA: Prostatic Specific Antigen: 10.64 ng/mL — ABNORMAL HIGH (ref 0.00–4.00)

## 2022-03-22 LAB — PROCALCITONIN: Procalcitonin: 0.42 ng/mL

## 2022-03-22 MED ORDER — TAMSULOSIN HCL 0.4 MG PO CAPS
0.4000 mg | ORAL_CAPSULE | Freq: Every day | ORAL | Status: DC
  Administered 2022-03-22 – 2022-03-24 (×2): 0.4 mg via ORAL
  Filled 2022-03-22 (×2): qty 1

## 2022-03-22 MED ORDER — SODIUM ZIRCONIUM CYCLOSILICATE 10 G PO PACK
10.0000 g | PACK | Freq: Once | ORAL | Status: AC
  Administered 2022-03-22: 10 g via ORAL
  Filled 2022-03-22: qty 1

## 2022-03-22 MED ORDER — FAMOTIDINE 20 MG PO TABS
20.0000 mg | ORAL_TABLET | Freq: Every day | ORAL | Status: DC
  Administered 2022-03-22 – 2022-03-23 (×2): 20 mg via ORAL
  Filled 2022-03-22 (×2): qty 1

## 2022-03-22 NOTE — Plan of Care (Signed)

## 2022-03-22 NOTE — Discharge Instructions (Signed)

## 2022-03-22 NOTE — Consult Note (Signed)
   Heart Failure Nurse Navigator Note  HFrEF 20 to 25% a previous echo.  Echocardiogram performed on this admission, results are pending.  He presented to the emergency room for concerns over abnormal labs by his PCP.  BNP 232.  Chest x-ray revealed no acute abnormalities.  Comorbidities:  Hypertension GERD Hyperlipidemia Hypothyroidism BPH Dilated cardiomyopathy Coronary artery disease Cirrhosis  Medications:  Aspirin 162 mg daily Carvedilol 12.5 mg 2 times a day with meals Levothyroxine 25 mcg daily  Vasotec and Lasix on hold Scheduled to start Lipitor 10 mg daily  Labs:  Sodium 140, potassium 5.5, chloride 114, CO2 19, BUN 60, creatinine 2.01, BNP 232, GFR 34 Weight is 88.4 kg Blood pressure 106/59  Initial meeting with patient on this admission.  He is lying quietly in bed having just finished echocardiogram.  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  He states that he has a cousin who lives with him.  Discussed heart failure and the function of his LV.  Went over diet and sodium restriction along with removing the saltshaker from the table.  He states that he has not used salt on his food for many years.  He states they do go out to eat a couple of times a week.  Discussed healthy choices  Also talked about daily weights and what to report.  He states at this time that he does not weigh himself on a daily basis, feels that his weight has been steady.  From his description sounds like he had worn LifeVest, hand has also declined insertion of ICD.  He has follow up in the [facility] on November 10 at 11 AM.  He was given the living with heart failure teaching booklet, zone magnet, info on heart failure and low-sodium along with weight chart.  Pricilla Riffle RN CHFN

## 2022-03-22 NOTE — Consult Note (Signed)
Hematology/Oncology Consult note [facility] [phone] [phone]  Patient Care Team: Birdie Sons, MD as PCP - General (Family Medicine) Corey Skains, MD as Consulting Physician (Cardiology) Pa, [facility] (Optometry)   Name of the patient: Darius Parker  [id number]  [birth year]    Reason for referral- ***   Referring physician- ***  Date of visit: '@TODAY'$ @   History of presenting illness- ***  ECOG PS- ***  Pain scale- ***   Review of systems- ROS  Allergies  Allergen Reactions   Tetanus Toxoids Swelling    Patient Active Problem List   Diagnosis Date Noted   AKI (acute kidney injury) ([place]) 03/21/2022   Hyperkalemia 03/21/2022   Elevated alkaline phosphatase level 03/21/2022   Pancreatic mass 03/21/2022   BPH (benign prostatic hyperplasia) 03/21/2022   Elevated troponin 03/21/2022   Elevated brain natriuretic peptide (BNP) level 03/21/2022   Exertional dyspnea 03/20/2022   Frequent urination 03/20/2022   Dry mouth 03/20/2022   Right shoulder pain 03/10/2022   Canker sore 03/10/2022   Dysphagia    Stricture and stenosis of esophagus    Chondromalacia of left patella 01/12/2021   Onychomycosis 04/28/2020   Leukocytosis 03/11/2020   Leukocytes in urine 03/10/2020   Urinary frequency 03/10/2020   Elevated PSA 12/25/2019   Family history of diabetes mellitus 08/19/2019   Hyperglycemia 08/19/2019   Essential hypertension 78/47/6546   Chronic systolic CHF (congestive heart failure), NYHA class 2 ([place]) 03/27/2017   Hyperlipidemia, mixed 03/27/2017   Coronary artery disease involving native coronary artery of native heart without angina pectoris 03/27/2017     Past Medical History:  Diagnosis Date   Arthritis    Bursitis    CAP (community acquired pneumonia) 02/08/2018   Dysrhythmia    GERD (gastroesophageal reflux disease)    Heart murmur    Hepatitis    Pulmonary embolism ([place]) 02/08/2018    Stomach ulcer      Past Surgical History:  Procedure Laterality Date   BACK SURGERY     CARDIAC CATHETERIZATION     CATARACT EXTRACTION W/PHACO Right 03/01/2016   Procedure: CATARACT EXTRACTION PHACO AND INTRAOCULAR LENS PLACEMENT ([place]);  Surgeon: Estill Cotta, MD;  Location: [facility];  Service: Ophthalmology;  Laterality: Right;  [place] 1.33 AP% 21.6 CDE 37.45 Fluid Pack Lot # [id number]   CATARACT EXTRACTION W/PHACO Left 08/16/2021   Procedure: CATARACT EXTRACTION PHACO AND INTRAOCULAR LENS PLACEMENT ([facility]) LEFT 10.06 00:58.7;  Surgeon: Birder Robson, MD;  Location: [facility];  Service: Ophthalmology;  Laterality: Left;  LEAVE LAST   COLONOSCOPY W/ POLYPECTOMY     ESOPHAGOGASTRODUODENOSCOPY (EGD) WITH PROPOFOL N/A 10/25/2021   Procedure: ESOPHAGOGASTRODUODENOSCOPY (EGD) WITH PROPOFOL;  Surgeon: Lucilla Lame, MD;  Location: [facility];  Service: Endoscopy;  Laterality: N/A;   TONSILLECTOMY      Social History   Socioeconomic History   Marital status: Widowed    Spouse name: Not on file   Number of children: 1   Years of education: Not on file   Highest education level: Associate degree: occupational, Hotel manager, or vocational program  Occupational History   Occupation: retired    Comment: mows 4 lawns  Tobacco Use   Smoking status: Never   Smokeless tobacco: Never  Vaping Use   Vaping Use: Never used  Substance and Sexual Activity   Alcohol use: No   Drug use: Never   Sexual activity: Yes    Birth control/protection: None  Other Topics Concern   Not  on file  Social History Narrative   Not on file   Social Determinants of Health   Financial Resource Strain: Low Risk  (01/05/2022)   Overall Financial Resource Strain (CARDIA)    Difficulty of Paying Living Expenses: Not hard at all  Food Insecurity: No Food Insecurity (03/22/2022)   Hunger Vital Sign    Worried About Running Out of Food in the Last Year: Never true    Ran Out of Food in the Last Year:  Never true  Transportation Needs: No Transportation Needs (03/21/2022)   PRAPARE - Hydrologist (Medical): No    Lack of Transportation (Non-Medical): No  Physical Activity: Insufficiently Active (01/05/2022)   Exercise Vital Sign    Days of Exercise per Week: 3 days    Minutes of Exercise per Session: 30 min  Stress: No Stress Concern Present (01/05/2022)   [place]    Feeling of Stress : Not at all  Social Connections: Moderately Isolated (01/05/2022)   Social Connection and Isolation Panel [NHANES]    Frequency of Communication with Friends and Family: Twice a week    Frequency of Social Gatherings with Friends and Family: Once a week    Attends Religious Services: Never    Marine scientist or Organizations: Yes    Attends Music therapist: More than 4 times per year    Marital Status: Widowed  Intimate Partner Violence: Not At Risk (03/21/2022)   Humiliation, Afraid, Rape, and Kick questionnaire    Fear of Current or Ex-Partner: No    Emotionally Abused: No    Physically Abused: No    Sexually Abused: No     Family History  Problem Relation Age of Onset   Diabetes Brother      Current Facility-Administered Medications:    acetaminophen (TYLENOL) tablet 650 mg, 650 mg, Oral, Q6H PRN **OR** acetaminophen (TYLENOL) suppository 650 mg, 650 mg, Rectal, Q6H PRN, Cox, Amy N, DO   aspirin chewable tablet 162 mg, 162 mg, Oral, Daily, Cox, Amy N, DO, 162 mg at 03/22/22 0854   atorvastatin (LIPITOR) tablet 10 mg, 10 mg, Oral, QHS, Cox, Amy N, DO   carvedilol (COREG) tablet 12.5 mg, 12.5 mg, Oral, BID WC, Cox, Amy N, DO, 12.5 mg at 03/22/22 1725   famotidine (PEPCID) tablet 20 mg, 20 mg, Oral, QHS, Dolan, Carissa E, RPH   heparin injection 5,000 Units, 5,000 Units, Subcutaneous, Q8H, Cox, Amy N, DO, 5,000 Units at 03/22/22 1444   hydrALAZINE (APRESOLINE) injection 5 mg, 5  mg, Intravenous, Q8H PRN, Cox, Amy N, DO   levothyroxine (SYNTHROID) tablet 25 mcg, 25 mcg, Oral, Q0600, Cox, Amy N, DO, 25 mcg at 03/22/22 0529   melatonin tablet 5 mg, 5 mg, Oral, QHS PRN, Cox, Amy N, DO   ondansetron (ZOFRAN) tablet 4 mg, 4 mg, Oral, Q6H PRN **OR** ondansetron (ZOFRAN) injection 4 mg, 4 mg, Intravenous, Q6H PRN, Cox, Amy N, DO   senna-docusate (Senokot-S) tablet 1 tablet, 1 tablet, Oral, QHS PRN, Cox, Amy N, DO   tamsulosin (FLOMAX) capsule 0.4 mg, 0.4 mg, Oral, Daily, Rai, Ripudeep K, MD, 0.4 mg at 03/22/22 1444   Physical exam:  Vitals:   03/22/22 0800 03/22/22 0901 03/22/22 1216 03/22/22 1601  BP:  124/69 (!) 106/59 109/71  Pulse:  83 75 79  Resp: '18 18 20 18  '$ Temp:   98.1 F (36.7 C) 98.9 F (37.2 C)  TempSrc:  SpO2:  98% 98% 98%  Weight:      Height:       Physical Exam        Latest Ref Rng & Units 03/22/2022    4:22 AM  CMP  Glucose 70 - 99 mg/dL 120   BUN 8 - 23 mg/dL 60   Creatinine 0.61 - 1.24 mg/dL 2.01   Sodium 135 - 145 mmol/L 140   Potassium 3.5 - 5.1 mmol/L 5.5   Chloride 98 - 111 mmol/L 114   CO2 22 - 32 mmol/L 19   Calcium 8.9 - 10.3 mg/dL 8.3       Latest Ref Rng & Units 03/22/2022    4:22 AM  CBC  WBC 4.0 - 10.5 K/uL 11.8   Hemoglobin 13.0 - 17.0 g/dL 9.8   Hematocrit 39.0 - 52.0 % 29.2   Platelets 150 - 400 K/uL 104     '@IMAGES'$ @  ECHOCARDIOGRAM COMPLETE  Result Date: 03/22/2022    ECHOCARDIOGRAM REPORT   Patient Name:   SKYELER SMOLA Leitz Date of Exam: 03/22/2022 Medical Rec #:  [id number]     Height:       74.0 in Accession #:    [id number]    Weight:       194.9 lb Date of Birth:  [birth year]    BSA:          2.150 m Patient Age:    76 years      BP:           124/69 mmHg Patient Gender: M             HR:           83 bpm. Exam Location:  [facility] Procedure: 2D Echo, Color Doppler and Cardiac Doppler Indications:     Dyspnea R06.00                  Elevated troponin  History:         Patient has prior history of Echocardiogram  examinations, most                  recent 02/09/2018. Signs/Symptoms:Murmur. Pulmonary embolism.  Sonographer:     Sherrie Sport Referring Phys:  [id number] AMY N COX Diagnosing Phys: Yolonda Kida MD  Sonographer Comments: Suboptimal apical window. Image acquisition challenging due to patient body habitus. IMPRESSIONS  1. Apical septal Akinesis/anuerysmal segment WMA.  2. Mild global hypo.  3. Left ventricular ejection fraction, by estimation, is 45 to 50%. The left ventricle has mildly decreased function. The left ventricle demonstrates regional wall motion abnormalities (see scoring diagram/findings for description). The left ventricular  internal cavity size was moderately to severely dilated. There is mild concentric left ventricular hypertrophy of the septal segment. Left ventricular diastolic parameters are consistent with Grade I diastolic dysfunction (impaired relaxation).  4. Right ventricular systolic function is normal. The right ventricular size is normal.  5. The mitral valve is normal in structure. Trivial mitral valve regurgitation.  6. The aortic valve is normal in structure. Aortic valve regurgitation is not visualized. FINDINGS  Left Ventricle: Left ventricular ejection fraction, by estimation, is 45 to 50%. The left ventricle has mildly decreased function. The left ventricle demonstrates regional wall motion abnormalities. The left ventricular internal cavity size was moderately to severely dilated. There is mild concentric left ventricular hypertrophy of the septal segment. Left ventricular diastolic parameters are consistent with Grade I diastolic dysfunction (impaired relaxation). Right Ventricle: The right ventricular size  is normal. No increase in right ventricular wall thickness. Right ventricular systolic function is normal. Left Atrium: Left atrial size was normal in size. Right Atrium: Right atrial size was normal in size. Pericardium: There is no evidence of pericardial effusion. Mitral  Valve: The mitral valve is normal in structure. Trivial mitral valve regurgitation. Tricuspid Valve: The tricuspid valve is normal in structure. Tricuspid valve regurgitation is mild. Aortic Valve: The aortic valve is normal in structure. Aortic valve regurgitation is not visualized. Aortic valve mean gradient measures 3.0 mmHg. Aortic valve peak gradient measures 4.4 mmHg. Aortic valve area, by VTI measures 2.89 cm. Pulmonic Valve: The pulmonic valve was normal in structure. Pulmonic valve regurgitation is not visualized. Aorta: The ascending aorta was not well visualized. IAS/Shunts: No atrial level shunt detected by color flow Doppler. Additional Comments: Apical septal Akinesis/anuerysmal segment WMA. Mild global hypo.  LEFT VENTRICLE PLAX 2D LVIDd:         5.70 cm      Diastology LVIDs:         4.30 cm      LV e' medial:    5.11 cm/s LV PW:         1.30 cm      LV E/e' medial:  8.2 LV IVS:        1.50 cm      LV e' lateral:   5.87 cm/s LVOT diam:     2.30 cm      LV E/e' lateral: 7.1 LV SV:         53 LV SV Index:   25 LVOT Area:     4.15 cm  LV Volumes (MOD) LV vol d, MOD A2C: 162.0 ml LV vol d, MOD A4C: 168.0 ml LV vol s, MOD A2C: 134.0 ml LV vol s, MOD A4C: 137.0 ml LV SV MOD A2C:     28.0 ml LV SV MOD A4C:     168.0 ml LV SV MOD BP:      27.3 ml RIGHT VENTRICLE RV Basal diam:  2.30 cm RV Mid diam:    1.50 cm RV S prime:     10.30 cm/s TAPSE (M-mode): 1.6 cm LEFT ATRIUM             Index        RIGHT ATRIUM          Index LA diam:        3.60 cm 1.67 cm/m   RA Area:     6.54 cm LA Vol (A2C):   64.9 ml 30.19 ml/m  RA Volume:   9.87 ml  4.59 ml/m LA Vol (A4C):   16.1 ml 7.49 ml/m LA Biplane Vol: 35.4 ml 16.47 ml/m  AORTIC VALVE AV Area (Vmax):    2.62 cm AV Area (Vmean):   2.20 cm AV Area (VTI):     2.89 cm AV Vmax:           105.00 cm/s AV Vmean:          84.600 cm/s AV VTI:            0.184 m AV Peak Grad:      4.4 mmHg AV Mean Grad:      3.0 mmHg LVOT Vmax:         66.30 cm/s LVOT Vmean:         44.700 cm/s LVOT VTI:          0.128 m LVOT/AV VTI ratio: 0.70  AORTA Ao Root  diam: 3.50 cm MITRAL VALVE               TRICUSPID VALVE MV Area (PHT): 5.70 cm    TR Peak grad:   11.8 mmHg MV Decel Time: 133 msec    TR Vmax:        172.00 cm/s MV E velocity: 41.90 cm/s MV A velocity: 90.10 cm/s  SHUNTS MV E/A ratio:  0.47        Systemic VTI:  0.13 m                            Systemic Diam: 2.30 cm Yolonda Kida MD Electronically signed by Yolonda Kida MD Signature Date/Time: 03/22/2022/4:47:39 PM    Final    CT CHEST WO CONTRAST  Result Date: 03/22/2022 CLINICAL DATA:  Pancreatic cancer staging; * Tracking Code: BO * EXAM: CT CHEST WITHOUT CONTRAST TECHNIQUE: Multidetector CT imaging of the chest was performed following the standard protocol without IV contrast. RADIATION DOSE REDUCTION: This exam was performed according to the departmental dose-optimization program which includes automated exposure control, adjustment of the mA and/or kV according to patient size and/or use of iterative reconstruction technique. COMPARISON:  None Available. FINDINGS: Cardiovascular: Normal heart size. No pericardial effusion. Moderate coronary artery calcifications. Normal caliber thoracic aorta with mild calcified plaque. Mediastinum/Nodes: Small hiatal hernia. Mildly enlarged right cardiophrenic angle lymph node measuring 1.1 cm in short axis on series 2, image 116. No enlarged mediastinal, hilar or axillary lymph nodes. Lungs/Pleura: Central airways are patent. Trace right pleural effusion. Calcified pleural plaques. Left-greater-than-right bibasilar atelectasis. No suspicious pulmonary nodules. Upper Abdomen: Partially visualized upper abdomen with small volume ascites, omental caking, and numerous peritoneal nodules, better evaluated on recent separately dictated contrast-enhanced CT of the abdomen and pelvis. No acute abnormality. Musculoskeletal: No chest wall mass or suspicious bone lesions identified.  IMPRESSION: 1. Mildly enlarged right cardiophrenic angle lymph node, concerning for metastatic disease. Otherwise, no evidence of metastatic disease in the chest. 2. Trace right pleural effusion. 3. Bilateral calcified pleural plaques, findings can be seen in the setting of asbestosis exposure. 4. Partially visualized upper abdomen with small volume ascites, omental caking, and numerous peritoneal nodules, better evaluated on recent separately dictated contrast-enhanced CT of the abdomen and pelvis. 5. Aortic Atherosclerosis (ICD10-I70.0). Electronically Signed   By: Yetta Glassman M.D.   On: 03/22/2022 15:00   CT ABDOMEN PELVIS WO CONTRAST  Result Date: 03/21/2022 CLINICAL DATA:  Abdominal pain. EXAM: CT ABDOMEN AND PELVIS WITHOUT CONTRAST TECHNIQUE: Multidetector CT imaging of the abdomen and pelvis was performed following the standard protocol without IV contrast. RADIATION DOSE REDUCTION: This exam was performed according to the departmental dose-optimization program which includes automated exposure control, adjustment of the mA and/or kV according to patient size and/or use of iterative reconstruction technique. COMPARISON:  None Available. FINDINGS: Evaluation of this exam is limited in the absence of intravenous contrast. Lower chest: Bibasilar subpleural thickening or possible trace effusion. There is lung bases are otherwise clear. No intra-abdominal free air.  Small ascites. Hepatobiliary: Cirrhosis. There is small stone and sludge within the gallbladder. Pancreas: There is an ill-defined mass in the body of the pancreas measuring approximately 5.3 x 3.1 cm (23/2). There is atrophy of the body and tail of the pancreas. Spleen: Several small calcified splenic granuloma. Adrenals/Urinary Tract: The adrenal glands are unremarkable. Mild bilateral renal parenchyma atrophy. Left renal cysts measure up to 2 cm. Additional bilateral renal  hypodense lesions are too small to characterize. There is no  hydronephrosis or nephrolithiasis on either side. The visualized ureters and urinary bladder appear unremarkable. Stomach/Bowel: There is sigmoid diverticulosis. There is no bowel obstruction. The appendix is not identified with certainty. Vascular/Lymphatic: Mild aortoiliac atherosclerotic disease. The IVC is unremarkable. No portal venous gas. No retroperitoneal adenopathy. Reproductive: The prostate gland is enlarged measuring 6.3 cm in transverse axial diameter. Other: Extensive omental nodularity and caking consistent with implants. Scattered areas of nodularity also noted in the mesentery primarily in the right hemiabdomen. Musculoskeletal: Osteopenia with degenerative changes of the spine. No acute osseous pathology. Small fat containing umbilical hernia. IMPRESSION: 1. Small malignant ascites and extensive omental implants consistent with metastatic disease. 2. Ill-defined mass in the body of the pancreas, concerning for malignancy. Further evaluation with MRI with and without contrast recommended. 3. Cirrhosis with small ascites. 4. Cholelithiasis. 5. Sigmoid diverticulosis. No bowel obstruction. 6. Enlarged prostate gland. 7.  Aortic Atherosclerosis (ICD10-I70.0). Electronically Signed   By: Anner Crete M.D.   On: 03/21/2022 20:43   DG Chest 2 View  Result Date: 03/21/2022 CLINICAL DATA:  Shortness of breath. Shortness of breath on exertion. EXAM: CHEST - 2 VIEW COMPARISON:  12/21/2019, CT 02/08/2018 FINDINGS: Normal heart size and mediastinal contours. Calcified pleural plaques are similar to prior exam. There also calcified mediastinal lymph nodes. No pulmonary edema. No acute airspace disease. No pleural effusion or pneumothorax. No acute osseous abnormalities are seen. IMPRESSION: 1. No acute chest findings. 2. Calcified pleural plaques and calcified mediastinal lymph nodes, unchanged from prior exam. Electronically Signed   By: Keith Rake M.D.   On: 03/21/2022 18:19   DG Chest 2  View  Result Date: 03/21/2022 CLINICAL DATA:  Shortness of breath. EXAM: CHEST - 2 VIEW COMPARISON:  Chest radiograph yesterday. Most recent chest CT 02/08/2018 FINDINGS: The heart is normal in size. Normal mediastinal contours. Calcified mediastinal lymph nodes. Nodular densities projecting over the lung bases are likely nipple shadows. There also chronic calcified pleural plaques. No pulmonary edema. No confluent consolidation, pleural effusion or pneumothorax. No acute osseous abnormalities are seen. IMPRESSION: 1. No acute chest findings. 2. Similar radiographic appearance of calcified pleural plaques. 3. Nodular densities projecting over the lung bases are likely nipple shadows. Electronically Signed   By: Keith Rake M.D.   On: 03/21/2022 18:18   DG Shoulder Right  Result Date: 03/01/2022 CLINICAL DATA:  Pain right shoulder EXAM: RIGHT SHOULDER - 2+ VIEW COMPARISON:  08/20/2014 FINDINGS: No recent fracture or dislocation is seen. There is coarse calcification adjacent to the greater tuberosity of proximal humerus which was not evident in the previous study. IMPRESSION: No fracture or dislocation is seen in right shoulder. There is linear coarse calcification adjacent to the greater tuberosity of proximal humerus suggesting possible calcific bursitis or calcific tendinosis. Electronically Signed   By: Elmer Picker M.D.   On: 03/01/2022 20:16    Assessment and plan- Patient is a 76 y.o. male ***   Thank you for this kind referral and the opportunity to participate in the care of this  Patient   Visit Diagnosis 1. Pancreatic mass   2. Malignant ascites     Dr. Randa Evens, MD, MPH [facility] [phone] 03/22/2022

## 2022-03-22 NOTE — Plan of Care (Signed)
  Problem: Clinical Measurements: Goal: Respiratory complications will improve Outcome: Progressing Goal: Cardiovascular complication will be avoided Outcome: Progressing   Problem: Coping: Goal: Level of anxiety will decrease Outcome: Progressing   Problem: Elimination: Goal: Will not experience complications related to bowel motility Outcome: Progressing   Problem: Pain Managment: Goal: General experience of comfort will improve Outcome: Progressing   Problem: Safety: Goal: Ability to remain free from injury will improve Outcome: Progressing   Problem: Skin Integrity: Goal: Risk for impaired skin integrity will decrease Outcome: Progressing

## 2022-03-22 NOTE — Progress Notes (Addendum)
[facility]                                                                              Darius Parker, is a 76 y.o. male, DOB - [birth year], [facility]:[id number] Admit date - 03/21/2022    Outpatient Primary MD for the patient is Fisher, Kirstie Peri, MD  LOS - 1  days  No chief complaint on file.      Brief summary   Patient is a 76 year old male with hypertension, GERD, hyperlipidemia, hypothyroidism, BPH presented due to abnormal labs. Patient reported that over the last couple months he has been feeling increasingly weak and tired, worsening shortness of breath with exertion going from his house to the [place].  His primary care physician ordered labs and was told that they were abnormal and was prompted to go to ED. In ED, found to have hyperkalemia 5.3, metabolic acidosis, normal anion gap, creatinine of 2.14.  Creatinine was 2.3 on 03/20/2022  CT abdomen/pelvis showed small malignant ascites, extensive omental implants consistent with metastatic disease.  Ill-defined mass in the body of the pancreas concerning for malignancy, recommended MRI without and with contrast.  Cirrhosis with small ascites.  Assessment & Plan    Principal Problem:   AKI (acute kidney injury) ([place]) super post on CKD stage IIIa -Baseline creatinine ~1.5, creatinine was 1.8 on 07/25/2021 -Presented with creatinine of 2.1, was 2.3 on 03/20/2022 with a labs done by PCP -Received gentle hydration, creatinine improving to 2.0 -Hold Lasix, Vasotec  Active Problems:   Elevated alkaline phosphatase level, pancreatic mass on CT with extent of omental implants consistent with metastatic disease -Follow CA 19-9, CEA, PSA, AFP -Primary carcinoma unknown at this time, possibly pancreatic mass, creatinine still 2.0, -If renal function continues to improve tomorrow, will obtain MRI abdomen -IR consulted for possible biopsy.  Oncology consulted     Essential hypertension -BP currently stable    Chronic  systolic CHF (congestive heart failure), NYHA class 2 (HCC) -Currently euvolemic, holding Lasix due to #1 -Prior echo in 2019 had shown EF of 20 to 25% -Follow 2D echo     Hyperlipidemia, mixed, CAD -Continue statin   Mild elevated troponin  -likely due to demand ischemia in the setting of chronic heart failure, AKI.  No chest pain    Hyperkalemia -Continue Lokelma    BPH (benign prostatic hyperplasia) -Resume Flomax, follow PSA   Code Status: Full code DVT Prophylaxis:  heparin injection 5,000 Units Start: 03/22/22 0600 Place TED hose Start: 03/21/22 2226   Level of Care: Level of care: Telemetry Medical Family Communication: Updated patient work-up  Disposition Plan:      Remains inpatient appropriate: Work-up in progress   Procedures:  None  Consultants:   Oncology IR  Antimicrobials: NONE    Medications  aspirin  162 mg Oral Daily   atorvastatin  10 mg Oral QHS   carvedilol  12.5 mg Oral BID WC   famotidine  20 mg Oral QHS   heparin  5,000 Units Subcutaneous Q8H   levothyroxine  25 mcg Oral Q0600   sodium zirconium cyclosilicate  10 g Oral Once  Subjective:   Darius Parker was seen and examined today.  No acute complaints.  Reported shortness of breath at baseline with exertion.  No acute chest pain.  No abdominal pain, nausea or vomiting.  Objective:   Vitals:   03/22/22 0500 03/22/22 0800 03/22/22 0901 03/22/22 1216  BP: 114/70  124/69 (!) 106/59  Pulse: 76  83 75  Resp: '18 18 18 20  '$ Temp: 98 F (36.7 C)   98.1 F (36.7 C)  TempSrc: Oral     SpO2: 98%  98% 98%  Weight: 88.4 kg     Height: '6\' 2"'$  (1.88 m)       Intake/Output Summary (Last 24 hours) at 03/22/2022 1242 Last data filed at 03/22/2022 0900 Gross per 24 hour  Intake 360 ml  Output --  Net 360 ml     Wt Readings from Last 3 Encounters:  03/22/22 88.4 kg  03/20/22 86.8 kg  03/10/22 87.4 kg     Exam General: Alert and oriented x 3, NAD Cardiovascular: S1 S2  auscultated,  RRR Respiratory: Clear to auscultation bilaterally, no wheezing Gastrointestinal: Soft, nontender, nondistended, + bowel sounds Ext: no pedal edema bilaterally Neuro: Strength 5/5 upper and lower extremities bilaterally Psych: Normal affect and demeanor    Data Reviewed:  I have personally reviewed following labs    CBC Lab Results  Component Value Date   WBC 11.8 (H) 03/22/2022   RBC 3.28 (L) 03/22/2022   HGB 9.8 (L) 03/22/2022   HCT 29.2 (L) 03/22/2022   MCV 89.0 03/22/2022   MCH 29.9 03/22/2022   PLT 104 (L) 03/22/2022   MCHC 33.6 03/22/2022   RDW 12.7 03/22/2022   LYMPHSABS 2.7 03/21/2022   MONOABS 1.1 (H) 03/21/2022   EOSABS 0.3 03/21/2022   BASOSABS 0.1 41/74/0814     Last metabolic panel Lab Results  Component Value Date   NA 140 03/22/2022   K 5.5 (H) 03/22/2022   CL 114 (H) 03/22/2022   CO2 19 (L) 03/22/2022   BUN 60 (H) 03/22/2022   CREATININE 2.01 (H) 03/22/2022   GLUCOSE 120 (H) 03/22/2022   GFRNONAA 34 (L) 03/22/2022   GFRAA 52 (L) 03/10/2020   CALCIUM 8.3 (L) 03/22/2022   PROT 7.3 03/21/2022   ALBUMIN 3.2 (L) 03/21/2022   LABGLOB 3.1 03/20/2022   AGRATIO 1.3 03/20/2022   BILITOT 0.9 03/21/2022   ALKPHOS 272 (H) 03/21/2022   AST 41 03/21/2022   ALT 38 03/21/2022   ANIONGAP 7 03/22/2022    CBG (last 3)  No results for input(s): "GLUCAP" in the last 72 hours.    Coagulation Profile: No results for input(s): "INR", "PROTIME" in the last 168 hours.   Radiology Studies: I have personally reviewed the imaging studies  CT ABDOMEN PELVIS WO CONTRAST  Result Date: 03/21/2022 CLINICAL DATA:  Abdominal pain. EXAM: CT ABDOMEN AND PELVIS WITHOUT CONTRAST TECHNIQUE: Multidetector CT imaging of the abdomen and pelvis was performed following the standard protocol without IV contrast. RADIATION DOSE REDUCTION: This exam was performed according to the departmental dose-optimization program which includes automated exposure control,  adjustment of the mA and/or kV according to patient size and/or use of iterative reconstruction technique. COMPARISON:  None Available. FINDINGS: Evaluation of this exam is limited in the absence of intravenous contrast. Lower chest: Bibasilar subpleural thickening or possible trace effusion. There is lung bases are otherwise clear. No intra-abdominal free air.  Small ascites. Hepatobiliary: Cirrhosis. There is small stone and sludge within the gallbladder. Pancreas: There is an  ill-defined mass in the body of the pancreas measuring approximately 5.3 x 3.1 cm (23/2). There is atrophy of the body and tail of the pancreas. Spleen: Several small calcified splenic granuloma. Adrenals/Urinary Tract: The adrenal glands are unremarkable. Mild bilateral renal parenchyma atrophy. Left renal cysts measure up to 2 cm. Additional bilateral renal hypodense lesions are too small to characterize. There is no hydronephrosis or nephrolithiasis on either side. The visualized ureters and urinary bladder appear unremarkable. Stomach/Bowel: There is sigmoid diverticulosis. There is no bowel obstruction. The appendix is not identified with certainty. Vascular/Lymphatic: Mild aortoiliac atherosclerotic disease. The IVC is unremarkable. No portal venous gas. No retroperitoneal adenopathy. Reproductive: The prostate gland is enlarged measuring 6.3 cm in transverse axial diameter. Other: Extensive omental nodularity and caking consistent with implants. Scattered areas of nodularity also noted in the mesentery primarily in the right hemiabdomen. Musculoskeletal: Osteopenia with degenerative changes of the spine. No acute osseous pathology. Small fat containing umbilical hernia. IMPRESSION: 1. Small malignant ascites and extensive omental implants consistent with metastatic disease. 2. Ill-defined mass in the body of the pancreas, concerning for malignancy. Further evaluation with MRI with and without contrast recommended. 3. Cirrhosis with  small ascites. 4. Cholelithiasis. 5. Sigmoid diverticulosis. No bowel obstruction. 6. Enlarged prostate gland. 7.  Aortic Atherosclerosis (ICD10-I70.0). Electronically Signed   By: Anner Crete M.D.   On: 03/21/2022 20:43   DG Chest 2 View  Result Date: 03/21/2022 CLINICAL DATA:  Shortness of breath. Shortness of breath on exertion. EXAM: CHEST - 2 VIEW COMPARISON:  12/21/2019, CT 02/08/2018 FINDINGS: Normal heart size and mediastinal contours. Calcified pleural plaques are similar to prior exam. There also calcified mediastinal lymph nodes. No pulmonary edema. No acute airspace disease. No pleural effusion or pneumothorax. No acute osseous abnormalities are seen. IMPRESSION: 1. No acute chest findings. 2. Calcified pleural plaques and calcified mediastinal lymph nodes, unchanged from prior exam. Electronically Signed   By: Keith Rake M.D.   On: 03/21/2022 18:19   DG Chest 2 View  Result Date: 03/21/2022 CLINICAL DATA:  Shortness of breath. EXAM: CHEST - 2 VIEW COMPARISON:  Chest radiograph yesterday. Most recent chest CT 02/08/2018 FINDINGS: The heart is normal in size. Normal mediastinal contours. Calcified mediastinal lymph nodes. Nodular densities projecting over the lung bases are likely nipple shadows. There also chronic calcified pleural plaques. No pulmonary edema. No confluent consolidation, pleural effusion or pneumothorax. No acute osseous abnormalities are seen. IMPRESSION: 1. No acute chest findings. 2. Similar radiographic appearance of calcified pleural plaques. 3. Nodular densities projecting over the lung bases are likely nipple shadows. Electronically Signed   By: Keith Rake M.D.   On: 03/21/2022 18:18       Myisha Pickerel M.D. [facility] 03/22/2022, 12:42 PM  Available via Epic secure chat 7am-7pm After 7 pm, please refer to night coverage provider listed on amion.

## 2022-03-22 NOTE — Consult Note (Signed)
 Chief Complaint: Patient was seen in consultation today for peritoneal carcinomatosis  Referring Physician(s): Archana Rao, MD  Supervising Physician: El-Abd, Yasser J  Patient Status: [facility] - In-pt  History of Present Illness: Darius Parker is a 75 y.o. male with PMH significant for dysrhythmia, GERD, and heart murmur being seen today for CT-guided biopsy of peritoneal mass. Patient presented to [facility] ED on 10/31 after labs ordered by his primary care provider revealed potassium of 5.6, BUN of 61, and creatinine of 2.14 with eGFR of 31. CT Abdomen/Pelvis without contrast revealed malignant ascites with extensive omental implants concerning for metastatic disease. A pancreatic mass was also noted. IR was consulted for CT-guided biopsy of peritoneal masses.  Past Medical History:  Diagnosis Date   Arthritis    Bursitis    CAP (community acquired pneumonia) 02/08/2018   Dysrhythmia    GERD (gastroesophageal reflux disease)    Heart murmur    Hepatitis    Pulmonary embolism (HCC) 02/08/2018   Stomach ulcer     Past Surgical History:  Procedure Laterality Date   BACK SURGERY     CARDIAC CATHETERIZATION     CATARACT EXTRACTION W/PHACO Right 03/01/2016   Procedure: CATARACT EXTRACTION PHACO AND INTRAOCULAR LENS PLACEMENT (IOC);  Surgeon: Steven Dingeldein, MD;  Location: [facility] ORS;  Service: Ophthalmology;  Laterality: Right;  US 1.33 AP% 21.6 CDE 37.45 Fluid Pack Lot # [id number]   CATARACT EXTRACTION W/PHACO Left 08/16/2021   Procedure: CATARACT EXTRACTION PHACO AND INTRAOCULAR LENS PLACEMENT (IOC) LEFT 10.06 00:58.7;  Surgeon: Porfilio, William, MD;  Location: [facility];  Service: Ophthalmology;  Laterality: Left;  LEAVE LAST   COLONOSCOPY W/ POLYPECTOMY     ESOPHAGOGASTRODUODENOSCOPY (EGD) WITH PROPOFOL N/A 10/25/2021   Procedure: ESOPHAGOGASTRODUODENOSCOPY (EGD) WITH PROPOFOL;  Surgeon: Wohl, Darren, MD;  Location: [facility];  Service: Endoscopy;  Laterality: N/A;    TONSILLECTOMY      Allergies: Tetanus toxoids  Medications: Prior to Admission medications   Medication Sig Start Date End Date Taking? Authorizing Provider  aspirin 81 MG chewable tablet 162 mg.   Yes [provider]  atorvastatin (LIPITOR) 10 MG tablet Take 10 mg at bedtime by mouth. 01/21/17  Yes [provider]  carvedilol (COREG) 12.5 MG tablet Take 12.5 mg by mouth 2 (two) times daily with a meal.   Yes [provider]  famotidine (PEPCID) 20 MG tablet TAKE 1 TABLET BY MOUTH TWICE A DAY 02/07/21  Yes Fisher, Donald E, MD  furosemide (LASIX) 20 MG tablet Take 20 mg by mouth daily.   Yes [provider]  levothyroxine (SYNTHROID) 25 MCG tablet TAKE 1 TABLET BY MOUTH EVERY DAY 08/11/21  Yes Fisher, Donald E, MD  meloxicam (MOBIC) 15 MG tablet Take 15 mg by mouth daily. 04/09/21  Yes [provider]  omega-3 acid ethyl esters (LOVAZA) 1 g capsule Take 1 capsule (1 g total) by mouth 2 (two) times daily. 12/07/21  Yes Fisher, Donald E, MD  tamsulosin (FLOMAX) 0.4 MG CAPS capsule Take 1 capsule (0.4 mg total) by mouth daily. 03/15/20  Yes Fisher, Susan W, PA-C  enalapril (VASOTEC) 10 MG tablet Take 10 mg by mouth 2 (two) times daily.    [provider]     Family History  Problem Relation Age of Onset   Diabetes Brother     Social History   Socioeconomic History   Marital status: Widowed    Spouse name: Not on file   Number of children: 1     Years of education: Not on file   Highest education level: Associate degree: occupational, technical, or vocational program  Occupational History   Occupation: retired    Comment: mows 4 lawns  Tobacco Use   Smoking status: Never   Smokeless tobacco: Never  Vaping Use   Vaping Use: Never used  Substance and Sexual Activity   Alcohol use: No   Drug use: Never   Sexual activity: Yes    Birth control/protection: None  Other Topics Concern   Not on file  Social History Narrative   Not  on file   Social Determinants of Health   Financial Resource Strain: Low Risk  (01/05/2022)   Overall Financial Resource Strain (CARDIA)    Difficulty of Paying Living Expenses: Not hard at all  Food Insecurity: No Food Insecurity (03/22/2022)   Hunger Vital Sign    Worried About Running Out of Food in the Last Year: Never true    Ran Out of Food in the Last Year: Never true  Transportation Needs: No Transportation Needs (03/21/2022)   PRAPARE - Transportation    Lack of Transportation (Medical): No    Lack of Transportation (Non-Medical): No  Physical Activity: Insufficiently Active (01/05/2022)   Exercise Vital Sign    Days of Exercise per Week: 3 days    Minutes of Exercise per Session: 30 min  Stress: No Stress Concern Present (01/05/2022)   [employer] - Occupational Stress Questionnaire    Feeling of Stress : Not at all  Social Connections: Moderately Isolated (01/05/2022)   Social Connection and Isolation Panel [NHANES]    Frequency of Communication with Friends and Family: Twice a week    Frequency of Social Gatherings with Friends and Family: Once a week    Attends Religious Services: Never    Active Member of Clubs or Organizations: Yes    Attends Club or Organization Meetings: More than 4 times per year    Marital Status: Widowed    Review of Systems: A 12 point ROS discussed and pertinent positives are indicated in the HPI above.  All other systems are negative.  Review of Systems  Constitutional:  Negative for chills and fever.  Respiratory:  Negative for chest tightness and shortness of breath.   Cardiovascular:  Positive for leg swelling. Negative for chest pain.  Gastrointestinal:  Positive for nausea and vomiting. Negative for abdominal pain and diarrhea.  Neurological:  Negative for dizziness, light-headedness and headaches.  Psychiatric/Behavioral:  Positive for confusion.     Vital Signs: BP (!) 106/59 (BP Location: Left Arm)    Pulse 75   Temp 98.1 F (36.7 C)   Resp 20   Ht 6' 2" (1.88 m)   Wt 194 lb 14.2 oz (88.4 kg)   SpO2 98%   BMI 25.02 kg/m    Physical Exam Vitals reviewed.  Constitutional:      General: He is not in acute distress.    Appearance: He is not ill-appearing.  HENT:     Mouth/Throat:     Mouth: Mucous membranes are moist.  Cardiovascular:     Rate and Rhythm: Normal rate and regular rhythm.     Pulses: Normal pulses.     Heart sounds: Normal heart sounds.  Pulmonary:     Effort: Pulmonary effort is normal.     Breath sounds: Normal breath sounds.  Abdominal:     General: Bowel sounds are normal. There is distension.  Musculoskeletal:     Right lower leg:   Edema present.     Left lower leg: Edema present.  Skin:    General: Skin is warm and dry.  Neurological:     Mental Status: He is alert and oriented to person, place, and time.  Psychiatric:        Mood and Affect: Mood normal.        Behavior: Behavior normal.        Judgment: Judgment normal.     Imaging: CT ABDOMEN PELVIS WO CONTRAST  Result Date: 03/21/2022 CLINICAL DATA:  Abdominal pain. EXAM: CT ABDOMEN AND PELVIS WITHOUT CONTRAST TECHNIQUE: Multidetector CT imaging of the abdomen and pelvis was performed following the standard protocol without IV contrast. RADIATION DOSE REDUCTION: This exam was performed according to the departmental dose-optimization program which includes automated exposure control, adjustment of the mA and/or kV according to patient size and/or use of iterative reconstruction technique. COMPARISON:  None Available. FINDINGS: Evaluation of this exam is limited in the absence of intravenous contrast. Lower chest: Bibasilar subpleural thickening or possible trace effusion. There is lung bases are otherwise clear. No intra-abdominal free air.  Small ascites. Hepatobiliary: Cirrhosis. There is small stone and sludge within the gallbladder. Pancreas: There is an ill-defined mass in the body of the  pancreas measuring approximately 5.3 x 3.1 cm (23/2). There is atrophy of the body and tail of the pancreas. Spleen: Several small calcified splenic granuloma. Adrenals/Urinary Tract: The adrenal glands are unremarkable. Mild bilateral renal parenchyma atrophy. Left renal cysts measure up to 2 cm. Additional bilateral renal hypodense lesions are too small to characterize. There is no hydronephrosis or nephrolithiasis on either side. The visualized ureters and urinary bladder appear unremarkable. Stomach/Bowel: There is sigmoid diverticulosis. There is no bowel obstruction. The appendix is not identified with certainty. Vascular/Lymphatic: Mild aortoiliac atherosclerotic disease. The IVC is unremarkable. No portal venous gas. No retroperitoneal adenopathy. Reproductive: The prostate gland is enlarged measuring 6.3 cm in transverse axial diameter. Other: Extensive omental nodularity and caking consistent with implants. Scattered areas of nodularity also noted in the mesentery primarily in the right hemiabdomen. Musculoskeletal: Osteopenia with degenerative changes of the spine. No acute osseous pathology. Small fat containing umbilical hernia. IMPRESSION: 1. Small malignant ascites and extensive omental implants consistent with metastatic disease. 2. Ill-defined mass in the body of the pancreas, concerning for malignancy. Further evaluation with MRI with and without contrast recommended. 3. Cirrhosis with small ascites. 4. Cholelithiasis. 5. Sigmoid diverticulosis. No bowel obstruction. 6. Enlarged prostate gland. 7.  Aortic Atherosclerosis (ICD10-I70.0). Electronically Signed   By: Anner Crete M.D.   On: 03/21/2022 20:43   DG Chest 2 View  Result Date: 03/21/2022 CLINICAL DATA:  Shortness of breath. Shortness of breath on exertion. EXAM: CHEST - 2 VIEW COMPARISON:  12/21/2019, CT 02/08/2018 FINDINGS: Normal heart size and mediastinal contours. Calcified pleural plaques are similar to prior exam. There also  calcified mediastinal lymph nodes. No pulmonary edema. No acute airspace disease. No pleural effusion or pneumothorax. No acute osseous abnormalities are seen. IMPRESSION: 1. No acute chest findings. 2. Calcified pleural plaques and calcified mediastinal lymph nodes, unchanged from prior exam. Electronically Signed   By: Keith Rake M.D.   On: 03/21/2022 18:19   DG Chest 2 View  Result Date: 03/21/2022 CLINICAL DATA:  Shortness of breath. EXAM: CHEST - 2 VIEW COMPARISON:  Chest radiograph yesterday. Most recent chest CT 02/08/2018 FINDINGS: The heart is normal in size. Normal mediastinal contours. Calcified mediastinal lymph nodes. Nodular densities projecting over the lung bases are  likely nipple shadows. There also chronic calcified pleural plaques. No pulmonary edema. No confluent consolidation, pleural effusion or pneumothorax. No acute osseous abnormalities are seen. IMPRESSION: 1. No acute chest findings. 2. Similar radiographic appearance of calcified pleural plaques. 3. Nodular densities projecting over the lung bases are likely nipple shadows. Electronically Signed   By: Keith Rake M.D.   On: 03/21/2022 18:18   DG Shoulder Right  Result Date: 03/01/2022 CLINICAL DATA:  Pain right shoulder EXAM: RIGHT SHOULDER - 2+ VIEW COMPARISON:  08/20/2014 FINDINGS: No recent fracture or dislocation is seen. There is coarse calcification adjacent to the greater tuberosity of proximal humerus which was not evident in the previous study. IMPRESSION: No fracture or dislocation is seen in right shoulder. There is linear coarse calcification adjacent to the greater tuberosity of proximal humerus suggesting possible calcific bursitis or calcific tendinosis. Electronically Signed   By: Elmer Picker M.D.   On: 03/01/2022 20:16    Labs:  CBC: Recent Labs    03/20/22 1452 03/21/22 1755 03/21/22 2004 03/22/22 0422  WBC 14.7* 17.2* 17.9* 11.8*  HGB 11.4* 12.1* 11.2* 9.8*  HCT 36.1* 37.4*  33.7* 29.2*  PLT 128* 160 156 104*    COAGS: No results for input(s): "INR", "APTT" in the last 8760 hours.  BMP: Recent Labs    07/25/21 0952 03/20/22 1452 03/21/22 1755 03/22/22 0422  NA 147* 143 142 140  K 4.9 5.6* 5.3* 5.5*  CL 112* 108* 110 114*  CO2 19* 19* 21* 19*  GLUCOSE 200* 146* 127* 120*  BUN 33* 58* 61* 60*  CALCIUM 9.0 9.4 9.1 8.3*  CREATININE 1.84* 2.31* 2.14* 2.01*  GFRNONAA  --   --  31* 34*    LIVER FUNCTION TESTS: Recent Labs    06/15/21 0917 07/25/21 0952 03/20/22 1452 03/21/22 1755  BILITOT 0.4 0.3 0.5 0.9  AST 22 20 37 41  ALT 14 14 42 38  ALKPHOS 106 102 339* 272*  PROT 7.2 6.7 7.0 7.3  ALBUMIN 4.3 4.3 3.9 3.2*    TUMOR MARKERS: No results for input(s): "AFPTM", "CEA", "CA199", "CHROMGRNA" in the last 8760 hours.  Assessment and Plan:  Darius Parker is a 76 yo male being seen today in relation to multiple peritoneal masses seen on CT on 10/31. IR was consulted to perform CT-guided biopsy of the peritoneum to establish the source of peritoneal carcinomatosis. Case was reviewed and approved by Dr Denna Haggard on 11/1 for the procedure to take place tentatively on 11/2. Patient will be made NPO at midnight.   Risks and benefits of CT-guided peritoneal biopsy was discussed with the patient and/or patient's family including, but not limited to bleeding, infection, damage to adjacent structures or low yield requiring additional tests.  All of the questions were answered and there is agreement to proceed.  Consent signed and in chart.   Thank you for this interesting consult.  I greatly enjoyed meeting Darius Parker and look forward to participating in their care.  A copy of this report was sent to the requesting provider on this date.  Electronically Signed: Lura Em, PA-C 03/22/2022, 2:30 PM   I spent a total of 40 Minutes    in face to face in clinical consultation, greater than 50% of which was counseling/coordinating care for peritoneal  carcinomatosis.

## 2022-03-22 NOTE — TOC Initial Note (Signed)
Transition of Care [facility]) - Initial/Assessment Note    Patient Details  Name: Darius Parker MRN: [id number] Date of Birth: 12-06-1945  Transition of [facility]) CM/SW Contact:    Colen Darling, [place] Phone Number: 03/22/2022, 5:12 PM  Clinical Narrative:                  The patient has  PCP named Dr. Lelon Huh. [facility] [address] [facility] [place]. He has no trouble with prescriptions. He picks up his prescriptions. No prior home health.        Patient Goals and CMS Choice    To improve    Expected Discharge Plan and Services    TBD                                            Prior Living Arrangements/Services             Care home          Activities of Daily Living Home Assistive Devices/Equipment: Wheelchair, Other (Comment) (Electric w/c) ADL Screening (condition at time of admission) Patient's cognitive ability adequate to safely complete daily activities?: Yes Is the patient deaf or have difficulty hearing?: No Does the patient have difficulty seeing, even when wearing glasses/contacts?: No Does the patient have difficulty concentrating, remembering, or making decisions?: No Patient able to express need for assistance with ADLs?: No Does the patient have difficulty dressing or bathing?: No Independently performs ADLs?: Yes (appropriate for developmental age) Does the patient have difficulty walking or climbing stairs?: Yes Weakness of Legs: None Weakness of Arms/Hands: None  Permission Sought/Granted                  Emotional Assessment              Admission diagnosis:  Pancreatic mass [K86.89] Malignant ascites [R18.0] AKI (acute kidney injury) ([place]) [N17.9] Patient Active Problem List   Diagnosis Date Noted   AKI (acute kidney injury) ([place]) 03/21/2022   Hyperkalemia 03/21/2022   Elevated alkaline phosphatase level 03/21/2022   Pancreatic mass 03/21/2022   BPH (benign prostatic hyperplasia) 03/21/2022    Elevated troponin 03/21/2022   Elevated brain natriuretic peptide (BNP) level 03/21/2022   Exertional dyspnea 03/20/2022   Frequent urination 03/20/2022   Dry mouth 03/20/2022   Right shoulder pain 03/10/2022   Canker sore 03/10/2022   Dysphagia    Stricture and stenosis of esophagus    Chondromalacia of left patella 01/12/2021   Onychomycosis 04/28/2020   Leukocytosis 03/11/2020   Leukocytes in urine 03/10/2020   Urinary frequency 03/10/2020   Elevated PSA 12/25/2019   Family history of diabetes mellitus 08/19/2019   Hyperglycemia 08/19/2019   Essential hypertension 86/76/1950   Chronic systolic CHF (congestive heart failure), NYHA class 2 ([place]) 03/27/2017   Hyperlipidemia, mixed 03/27/2017   Coronary artery disease involving native coronary artery of native heart without angina pectoris 03/27/2017   PCP:  Birdie Sons, MD Pharm[facility]   [id number] [address]: [phone]: [phone] [id number]Phone: [phone]: [phone] [facility] [address]  [place] [address] Phone: [phone] Fax: [phone]     Social Determinants of Health (SDOH) Interventions    Readmission Risk Interventions     No data to display

## 2022-03-22 NOTE — Progress Notes (Signed)
Nutrition Brief Note  RD pulled to chart secondary to CHF.   Wt Readings from Last 15 Encounters:  03/22/22 88.4 kg  03/20/22 86.8 kg  03/10/22 87.4 kg  02/28/22 86.6 kg  01/05/22 88.2 kg  10/25/21 86.2 kg  10/11/21 89.8 kg  08/16/21 88.9 kg  07/25/21 91.3 kg  06/15/21 90.8 kg  06/07/21 92.1 kg  12/31/20 90.3 kg  06/07/20 79.4 kg  05/06/20 81.6 kg  04/28/20 91.2 kg   Pt with history of hypertension, GERD, hyperlipidemia, hypothyroid, BPH, who presents for chief concerns of abnormal labs at the urging of his PCP.  Pt admitted with AKI.   RD provided "Low Sodium Nutrition Therapy" handout; attached to AVS/ discharge summary.   Current diet order is Heart Healthy (liberalize diet to 2 gram sodium), patient is consuming approximately n/a% of meals at this time. Labs and medications reviewed.   No nutrition interventions warranted at this time. If nutrition issues arise, please consult RD.   Loistine Chance, RD, LDN, [place] Registered Dietitian II Certified Diabetes Care and Education Specialist Please refer to [facility] for RD and/or RD on-call/weekend/after hours pager

## 2022-03-22 NOTE — Progress Notes (Signed)
*  PRELIMINARY RESULTS* Echocardiogram 2D Echocardiogram has been performed.  Darius Parker 03/22/2022, 10:33 AM

## 2022-03-23 ENCOUNTER — Inpatient Hospital Stay: Payer: PPO

## 2022-03-23 DIAGNOSIS — N179 Acute kidney failure, unspecified: Secondary | ICD-10-CM | POA: Diagnosis not present

## 2022-03-23 DIAGNOSIS — K8689 Other specified diseases of pancreas: Secondary | ICD-10-CM | POA: Diagnosis not present

## 2022-03-23 DIAGNOSIS — R180 Malignant ascites: Secondary | ICD-10-CM | POA: Diagnosis not present

## 2022-03-23 LAB — COMPREHENSIVE METABOLIC PANEL
ALT: 31 U/L (ref 0–44)
AST: 28 U/L (ref 15–41)
Albumin: 2.5 g/dL — ABNORMAL LOW (ref 3.5–5.0)
Alkaline Phosphatase: 215 U/L — ABNORMAL HIGH (ref 38–126)
Anion gap: 8 (ref 5–15)
BUN: 57 mg/dL — ABNORMAL HIGH (ref 8–23)
CO2: 19 mmol/L — ABNORMAL LOW (ref 22–32)
Calcium: 8.2 mg/dL — ABNORMAL LOW (ref 8.9–10.3)
Chloride: 113 mmol/L — ABNORMAL HIGH (ref 98–111)
Creatinine, Ser: 1.95 mg/dL — ABNORMAL HIGH (ref 0.61–1.24)
GFR, Estimated: 35 mL/min — ABNORMAL LOW (ref >60)
Glucose, Bld: 130 mg/dL — ABNORMAL HIGH (ref 70–99)
Potassium: 5 mmol/L (ref 3.5–5.1)
Sodium: 140 mmol/L (ref 135–145)
Total Bilirubin: 0.8 mg/dL (ref 0.3–1.2)
Total Protein: 5.8 g/dL — ABNORMAL LOW (ref 6.5–8.1)

## 2022-03-23 LAB — CBC
HCT: 30.2 % — ABNORMAL LOW (ref 39.0–52.0)
Hemoglobin: 9.9 g/dL — ABNORMAL LOW (ref 13.0–17.0)
MCH: 29.1 pg (ref 26.0–34.0)
MCHC: 32.8 g/dL (ref 30.0–36.0)
MCV: 88.8 fL (ref 80.0–100.0)
Platelets: 124 10*3/uL — ABNORMAL LOW (ref 150–400)
RBC: 3.4 MIL/uL — ABNORMAL LOW (ref 4.22–5.81)
RDW: 12.8 % (ref 11.5–15.5)
WBC: 11.5 10*3/uL — ABNORMAL HIGH (ref 4.0–10.5)
nRBC: 0 % (ref 0.0–0.2)

## 2022-03-23 LAB — CEA: CEA: 1505 ng/mL — ABNORMAL HIGH (ref 0.0–4.7)

## 2022-03-23 LAB — AFP TUMOR MARKER: AFP, Serum, Tumor Marker: 3.5 ng/mL (ref 0.0–8.4)

## 2022-03-23 MED ORDER — SODIUM BICARBONATE 650 MG PO TABS
650.0000 mg | ORAL_TABLET | Freq: Two times a day (BID) | ORAL | Status: DC
  Administered 2022-03-23 – 2022-03-24 (×3): 650 mg via ORAL
  Filled 2022-03-23 (×3): qty 1

## 2022-03-23 MED ORDER — SODIUM CHLORIDE 0.9 % IV SOLN: INTRAVENOUS | Status: DC

## 2022-03-23 MED ORDER — FENTANYL CITRATE (PF) 100 MCG/2ML IJ SOLN
INTRAMUSCULAR | Status: AC | PRN
  Administered 2022-03-23 (×2): 25 ug via INTRAVENOUS

## 2022-03-23 MED ORDER — MIDAZOLAM HCL 2 MG/2ML IJ SOLN
INTRAMUSCULAR | Status: AC | PRN
  Administered 2022-03-23: .5 mg via INTRAVENOUS

## 2022-03-23 MED ORDER — MIDAZOLAM HCL 2 MG/2ML IJ SOLN
INTRAMUSCULAR | Status: AC
  Filled 2022-03-23: qty 2

## 2022-03-23 MED ORDER — MIDAZOLAM HCL 5 MG/5ML IJ SOLN
INTRAMUSCULAR | Status: AC | PRN
  Administered 2022-03-23: .5 mg via INTRAVENOUS

## 2022-03-23 MED ORDER — FENTANYL CITRATE (PF) 100 MCG/2ML IJ SOLN
INTRAMUSCULAR | Status: AC
  Filled 2022-03-23: qty 2

## 2022-03-23 NOTE — Procedures (Signed)
Interventional Radiology Procedure Note  Procedure: CT Guided Biopsy of omental mass  Complications: None  Estimated Blood Loss: < 10 mL  Findings: 60 G core biopsy of omental tumor in right anterior abdomen performed under CT guidance.  Three core samples obtained and sent to Pathology.  Venetia Night. Kathlene Cote, M.D Pager:  [phone]

## 2022-03-23 NOTE — Plan of Care (Signed)

## 2022-03-23 NOTE — Progress Notes (Addendum)
[facility]                                                                              Darius Parker, is a 76 y.o. male, DOB - 01/28/1946, EZM:[id number] Admit date - 03/21/2022    Outpatient Primary MD for the patient is Fisher, Kirstie Peri, MD  LOS - 2  days  No chief complaint on file.      Brief summary   Patient is a 76 year old male with hypertension, GERD, hyperlipidemia, hypothyroidism, BPH presented due to abnormal labs. Patient reported that over the last couple months he has been feeling increasingly weak and tired, worsening shortness of breath with exertion going from his house to the [place].  His primary care physician ordered labs and was told that they were abnormal and was prompted to go to ED. In ED, found to have hyperkalemia 5.3, metabolic acidosis, normal anion gap, creatinine of 2.14.  Creatinine was 2.3 on 03/20/2022  CT abdomen/pelvis showed small malignant ascites, extensive omental implants consistent with metastatic disease.  Ill-defined mass in the body of the pancreas concerning for malignancy, recommended MRI without and with contrast.  Cirrhosis with small ascites.  Assessment & Plan    Principal Problem:   AKI (acute kidney injury) ([place]) super post on CKD stage IIIa, metabolic acidosis -Baseline creatinine ~1.5, creatinine was 1.8 on 07/25/2021 -Presented with creatinine of 2.1, was 2.3 on 03/20/2022 with a labs done by PCP -Continue to hold Lasix, Vasotec, -Received IV fluid hydration, creatinine improving to 1.9 -Added sodium bicarb tabs 650 mg twice daily  Active Problems:   Elevated alkaline phosphatase level, pancreatic mass on CT with extent of omental implants consistent with metastatic disease -Follow CA 19-9, CEA, PSA, AFP -Primary carcinoma unknown at this time, possibly pancreatic mass -Oncology consulted, seen by Dr. Janese Banks.  CT chest showed mildly enlarged right cardiophrenic angle lymph node concerning for metastatic  disease otherwise no evidence of metastatic disease in the chest. -IR consulted, undergoing CT-guided omental biopsy today  -CEA elevated 1505, AFP 3.5, CA 19-9 pending -Per oncology, management would be mostly palliative    Essential hypertension -BP currently soft     Chronic systolic CHF (congestive heart failure), NYHA class 2 ([place]) -Prior echo in 2019 had shown EF of 20 to 25% -2D echo showed improved EF of 45-50%, G1 DD. -Continue to hold Lasix, Vasotec    Hyperlipidemia, mixed, CAD -Continue statin  Mild elevated troponin  -likely due to demand ischemia in the setting of chronic heart failure, AKI.  No chest pain    Hyperkalemia -Continue Lokelma    BPH (benign prostatic hyperplasia) -Resume Flomax, follow PSA   Code Status: Full code DVT Prophylaxis:  heparin injection 5,000 Units Start: 03/22/22 0600 Place TED hose Start: 03/21/22 2226   Level of Care: Level of care: Med-Surg Family Communication: Updated patient work-up  Disposition Plan:      Remains inpatient appropriate: Work-up in progress,    Procedures:  CT guided omental bx today   Consultants:   Oncology IR  Antimicrobials: NONE    Medications  aspirin  162 mg Oral Daily   atorvastatin  10 mg Oral QHS   carvedilol  12.5 mg Oral BID WC   famotidine  20 mg Oral QHS   heparin  5,000 Units Subcutaneous Q8H   levothyroxine  25 mcg Oral Q0600   midazolam       tamsulosin  0.4 mg Oral Daily      Subjective:   Darius Parker was seen and examined today.  No acute complaints, no chest pain, nausea vomiting or abdominal pain.  Objective:   Vitals:   03/23/22 1209 03/23/22 1215 03/23/22 1220 03/23/22 1230  BP: 115/68 (!) 86/70 98/64 101/67  Pulse: 84 83 83 86  Resp: '18 20 19 17  '$ Temp:      TempSrc:      SpO2: 95% 94% 95% 96%  Weight:      Height:       No intake or output data in the 24 hours ending 03/23/22 1244    Wt Readings from Last 3 Encounters:  03/22/22 88.4 kg  03/20/22  86.8 kg  03/10/22 87.4 kg    Physical Exam General: Alert and oriented x 3, NAD Cardiovascular: S1 S2 clear, RRR.  Respiratory: CTAB Gastrointestinal: Soft, nontender, mildly distended, NBS Ext: no pedal edema bilaterally Neuro: no new deficits Psych: Normal affect    Data Reviewed:  I have personally reviewed following labs    CBC Lab Results  Component Value Date   WBC 11.5 (H) 03/23/2022   RBC 3.40 (L) 03/23/2022   HGB 9.9 (L) 03/23/2022   HCT 30.2 (L) 03/23/2022   MCV 88.8 03/23/2022   MCH 29.1 03/23/2022   PLT 124 (L) 03/23/2022   MCHC 32.8 03/23/2022   RDW 12.8 03/23/2022   LYMPHSABS 2.7 03/21/2022   MONOABS 1.1 (H) 03/21/2022   EOSABS 0.3 03/21/2022   BASOSABS 0.1 67/20/9470     Last metabolic panel Lab Results  Component Value Date   NA 140 03/23/2022   K 5.0 03/23/2022   CL 113 (H) 03/23/2022   CO2 19 (L) 03/23/2022   BUN 57 (H) 03/23/2022   CREATININE 1.95 (H) 03/23/2022   GLUCOSE 130 (H) 03/23/2022   GFRNONAA 35 (L) 03/23/2022   GFRAA 52 (L) 03/10/2020   CALCIUM 8.2 (L) 03/23/2022   PROT 5.8 (L) 03/23/2022   ALBUMIN 2.5 (L) 03/23/2022   LABGLOB 3.1 03/20/2022   AGRATIO 1.3 03/20/2022   BILITOT 0.8 03/23/2022   ALKPHOS 215 (H) 03/23/2022   AST 28 03/23/2022   ALT 31 03/23/2022   ANIONGAP 8 03/23/2022    CBG (last 3)  No results for input(s): "GLUCAP" in the last 72 hours.    Coagulation Profile: No results for input(s): "INR", "PROTIME" in the last 168 hours.   Radiology Studies: I have personally reviewed the imaging studies  ECHOCARDIOGRAM COMPLETE  Result Date: 03/22/2022    ECHOCARDIOGRAM REPORT   Patient Name:   Darius Parker Date of Exam: 03/22/2022 Medical Rec #:  [id number]     Height:       74.0 in Accession #:    [id number]    Weight:       194.9 lb Date of Birth:  10/02/1945    BSA:          2.150 m Patient Age:    69 years      BP:           124/69 mmHg Patient Gender: M             HR:  83 bpm. Exam Location:  [facility]  Procedure: 2D Echo, Color Doppler and Cardiac Doppler Indications:     Dyspnea R06.00                  Elevated troponin  History:         Patient has prior history of Echocardiogram examinations, most                  recent 02/09/2018. Signs/Symptoms:Murmur. Pulmonary embolism.  Sonographer:     Sherrie Sport Referring Phys:  [id number] AMY N [facility] Diagnosing Phys: Yolonda Kida MD  Sonographer Comments: Suboptimal apical window. Image acquisition challenging due to patient body habitus. IMPRESSIONS  1. Apical septal Akinesis/anuerysmal segment WMA.  2. Mild global hypo.  3. Left ventricular ejection fraction, by estimation, is 45 to 50%. The left ventricle has mildly decreased function. The left ventricle demonstrates regional wall motion abnormalities (see scoring diagram/findings for description). The left ventricular  internal cavity size was moderately to severely dilated. There is mild concentric left ventricular hypertrophy of the septal segment. Left ventricular diastolic parameters are consistent with Grade I diastolic dysfunction (impaired relaxation).  4. Right ventricular systolic function is normal. The right ventricular size is normal.  5. The mitral valve is normal in structure. Trivial mitral valve regurgitation.  6. The aortic valve is normal in structure. Aortic valve regurgitation is not visualized. FINDINGS  Left Ventricle: Left ventricular ejection fraction, by estimation, is 45 to 50%. The left ventricle has mildly decreased function. The left ventricle demonstrates regional wall motion abnormalities. The left ventricular internal cavity size was moderately to severely dilated. There is mild concentric left ventricular hypertrophy of the septal segment. Left ventricular diastolic parameters are consistent with Grade I diastolic dysfunction (impaired relaxation). Right Ventricle: The right ventricular size is normal. No increase in right ventricular wall thickness. Right ventricular systolic  function is normal. Left Atrium: Left atrial size was normal in size. Right Atrium: Right atrial size was normal in size. Pericardium: There is no evidence of pericardial effusion. Mitral Valve: The mitral valve is normal in structure. Trivial mitral valve regurgitation. Tricuspid Valve: The tricuspid valve is normal in structure. Tricuspid valve regurgitation is mild. Aortic Valve: The aortic valve is normal in structure. Aortic valve regurgitation is not visualized. Aortic valve mean gradient measures 3.0 mmHg. Aortic valve peak gradient measures 4.4 mmHg. Aortic valve area, by VTI measures 2.89 cm. Pulmonic Valve: The pulmonic valve was normal in structure. Pulmonic valve regurgitation is not visualized. Aorta: The ascending aorta was not well visualized. IAS/Shunts: No atrial level shunt detected by color flow Doppler. Additional Comments: Apical septal Akinesis/anuerysmal segment WMA. Mild global hypo.  LEFT VENTRICLE PLAX 2D LVIDd:         5.70 cm      Diastology LVIDs:         4.30 cm      LV e' medial:    5.11 cm/s LV PW:         1.30 cm      LV E/e' medial:  8.2 LV IVS:        1.50 cm      LV e' lateral:   5.87 cm/s LVOT diam:     2.30 cm      LV E/e' lateral: 7.1 LV SV:         53 LV SV Index:   25 LVOT Area:     4.15 cm  LV Volumes (MOD) LV vol d, MOD A2C:  162.0 ml LV vol d, MOD A4C: 168.0 ml LV vol s, MOD A2C: 134.0 ml LV vol s, MOD A4C: 137.0 ml LV SV MOD A2C:     28.0 ml LV SV MOD A4C:     168.0 ml LV SV MOD BP:      27.3 ml RIGHT VENTRICLE RV Basal diam:  2.30 cm RV Mid diam:    1.50 cm RV S prime:     10.30 cm/s TAPSE (M-mode): 1.6 cm LEFT ATRIUM             Index        RIGHT ATRIUM          Index LA diam:        3.60 cm 1.67 cm/m   RA Area:     6.54 cm LA Vol (A2C):   64.9 ml 30.19 ml/m  RA Volume:   9.87 ml  4.59 ml/m LA Vol (A4C):   16.1 ml 7.49 ml/m LA Biplane Vol: 35.4 ml 16.47 ml/m  AORTIC VALVE AV Area (Vmax):    2.62 cm AV Area (Vmean):   2.20 cm AV Area (VTI):     2.89 cm AV  Vmax:           105.00 cm/s AV Vmean:          84.600 cm/s AV VTI:            0.184 m AV Peak Grad:      4.4 mmHg AV Mean Grad:      3.0 mmHg LVOT Vmax:         66.30 cm/s LVOT Vmean:        44.700 cm/s LVOT VTI:          0.128 m LVOT/AV VTI ratio: 0.70  AORTA Ao Root diam: 3.50 cm MITRAL VALVE               TRICUSPID VALVE MV Area (PHT): 5.70 cm    TR Peak grad:   11.8 mmHg MV Decel Time: 133 msec    TR Vmax:        172.00 cm/s MV E velocity: 41.90 cm/s MV A velocity: 90.10 cm/s  SHUNTS MV E/A ratio:  0.47        Systemic VTI:  0.13 m                            Systemic Diam: 2.30 cm Yolonda Kida MD Electronically signed by Yolonda Kida MD Signature Date/Time: 03/22/2022/4:47:39 PM    Final    CT CHEST WO CONTRAST  Result Date: 03/22/2022 CLINICAL DATA:  Pancreatic cancer staging; * Tracking Code: BO * EXAM: CT CHEST WITHOUT CONTRAST TECHNIQUE: Multidetector CT imaging of the chest was performed following the standard protocol without IV contrast. RADIATION DOSE REDUCTION: This exam was performed according to the departmental dose-optimization program which includes automated exposure control, adjustment of the mA and/or kV according to patient size and/or use of iterative reconstruction technique. COMPARISON:  None Available. FINDINGS: Cardiovascular: Normal heart size. No pericardial effusion. Moderate coronary artery calcifications. Normal caliber thoracic aorta with mild calcified plaque. Mediastinum/Nodes: Small hiatal hernia. Mildly enlarged right cardiophrenic angle lymph node measuring 1.1 cm in short axis on series 2, image 116. No enlarged mediastinal, hilar or axillary lymph nodes. Lungs/Pleura: Central airways are patent. Trace right pleural effusion. Calcified pleural plaques. Left-greater-than-right bibasilar atelectasis. No suspicious pulmonary nodules. Upper Abdomen: Partially visualized upper abdomen with  small volume ascites, omental caking, and numerous peritoneal nodules, better  evaluated on recent separately dictated contrast-enhanced CT of the abdomen and pelvis. No acute abnormality. Musculoskeletal: No chest wall mass or suspicious bone lesions identified. IMPRESSION: 1. Mildly enlarged right cardiophrenic angle lymph node, concerning for metastatic disease. Otherwise, no evidence of metastatic disease in the chest. 2. Trace right pleural effusion. 3. Bilateral calcified pleural plaques, findings can be seen in the setting of asbestosis exposure. 4. Partially visualized upper abdomen with small volume ascites, omental caking, and numerous peritoneal nodules, better evaluated on recent separately dictated contrast-enhanced CT of the abdomen and pelvis. 5. Aortic Atherosclerosis (ICD10-I70.0). Electronically Signed   By: Yetta Glassman M.D.   On: 03/22/2022 15:00   CT ABDOMEN PELVIS WO CONTRAST  Result Date: 03/21/2022 CLINICAL DATA:  Abdominal pain. EXAM: CT ABDOMEN AND PELVIS WITHOUT CONTRAST TECHNIQUE: Multidetector CT imaging of the abdomen and pelvis was performed following the standard protocol without IV contrast. RADIATION DOSE REDUCTION: This exam was performed according to the departmental dose-optimization program which includes automated exposure control, adjustment of the mA and/or kV according to patient size and/or use of iterative reconstruction technique. COMPARISON:  None Available. FINDINGS: Evaluation of this exam is limited in the absence of intravenous contrast. Lower chest: Bibasilar subpleural thickening or possible trace effusion. There is lung bases are otherwise clear. No intra-abdominal free air.  Small ascites. Hepatobiliary: Cirrhosis. There is small stone and sludge within the gallbladder. Pancreas: There is an ill-defined mass in the body of the pancreas measuring approximately 5.3 x 3.1 cm (23/2). There is atrophy of the body and tail of the pancreas. Spleen: Several small calcified splenic granuloma. Adrenals/Urinary Tract: The adrenal glands are  unremarkable. Mild bilateral renal parenchyma atrophy. Left renal cysts measure up to 2 cm. Additional bilateral renal hypodense lesions are too small to characterize. There is no hydronephrosis or nephrolithiasis on either side. The visualized ureters and urinary bladder appear unremarkable. Stomach/Bowel: There is sigmoid diverticulosis. There is no bowel obstruction. The appendix is not identified with certainty. Vascular/Lymphatic: Mild aortoiliac atherosclerotic disease. The IVC is unremarkable. No portal venous gas. No retroperitoneal adenopathy. Reproductive: The prostate gland is enlarged measuring 6.3 cm in transverse axial diameter. Other: Extensive omental nodularity and caking consistent with implants. Scattered areas of nodularity also noted in the mesentery primarily in the right hemiabdomen. Musculoskeletal: Osteopenia with degenerative changes of the spine. No acute osseous pathology. Small fat containing umbilical hernia. IMPRESSION: 1. Small malignant ascites and extensive omental implants consistent with metastatic disease. 2. Ill-defined mass in the body of the pancreas, concerning for malignancy. Further evaluation with MRI with and without contrast recommended. 3. Cirrhosis with small ascites. 4. Cholelithiasis. 5. Sigmoid diverticulosis. No bowel obstruction. 6. Enlarged prostate gland. 7.  Aortic Atherosclerosis (ICD10-I70.0). Electronically Signed   By: Anner Crete M.D.   On: 03/21/2022 20:43   DG Chest 2 View  Result Date: 03/21/2022 CLINICAL DATA:  Shortness of breath. EXAM: CHEST - 2 VIEW COMPARISON:  Chest radiograph yesterday. Most recent chest CT 02/08/2018 FINDINGS: The heart is normal in size. Normal mediastinal contours. Calcified mediastinal lymph nodes. Nodular densities projecting over the lung bases are likely nipple shadows. There also chronic calcified pleural plaques. No pulmonary edema. No confluent consolidation, pleural effusion or pneumothorax. No acute osseous  abnormalities are seen. IMPRESSION: 1. No acute chest findings. 2. Similar radiographic appearance of calcified pleural plaques. 3. Nodular densities projecting over the lung bases are likely nipple shadows. Electronically Signed   By:  Keith Rake M.D.   On: 03/21/2022 18:18       Darius Parker M.D. [facility] 03/23/2022, 12:44 PM  Available via Epic secure chat 7am-7pm After 7 pm, please refer to night coverage provider listed on amion.

## 2022-03-24 ENCOUNTER — Inpatient Hospital Stay: Payer: PPO | Admitting: Oncology

## 2022-03-24 DIAGNOSIS — K8689 Other specified diseases of pancreas: Secondary | ICD-10-CM | POA: Diagnosis not present

## 2022-03-24 DIAGNOSIS — E875 Hyperkalemia: Secondary | ICD-10-CM | POA: Diagnosis not present

## 2022-03-24 DIAGNOSIS — R180 Malignant ascites: Secondary | ICD-10-CM | POA: Diagnosis not present

## 2022-03-24 DIAGNOSIS — N179 Acute kidney failure, unspecified: Secondary | ICD-10-CM | POA: Diagnosis not present

## 2022-03-24 LAB — CBC
HCT: 30 % — ABNORMAL LOW (ref 39.0–52.0)
Hemoglobin: 10 g/dL — ABNORMAL LOW (ref 13.0–17.0)
MCH: 29.3 pg (ref 26.0–34.0)
MCHC: 33.3 g/dL (ref 30.0–36.0)
MCV: 88 fL (ref 80.0–100.0)
Platelets: 153 10*3/uL (ref 150–400)
RBC: 3.41 MIL/uL — ABNORMAL LOW (ref 4.22–5.81)
RDW: 12.6 % (ref 11.5–15.5)
WBC: 12.7 10*3/uL — ABNORMAL HIGH (ref 4.0–10.5)
nRBC: 0 % (ref 0.0–0.2)

## 2022-03-24 LAB — COMPREHENSIVE METABOLIC PANEL
ALT: 28 U/L (ref 0–44)
AST: 30 U/L (ref 15–41)
Albumin: 2.4 g/dL — ABNORMAL LOW (ref 3.5–5.0)
Alkaline Phosphatase: 230 U/L — ABNORMAL HIGH (ref 38–126)
Anion gap: 9 (ref 5–15)
BUN: 49 mg/dL — ABNORMAL HIGH (ref 8–23)
CO2: 19 mmol/L — ABNORMAL LOW (ref 22–32)
Calcium: 8.4 mg/dL — ABNORMAL LOW (ref 8.9–10.3)
Chloride: 114 mmol/L — ABNORMAL HIGH (ref 98–111)
Creatinine, Ser: 1.8 mg/dL — ABNORMAL HIGH (ref 0.61–1.24)
GFR, Estimated: 38 mL/min — ABNORMAL LOW (ref >60)
Glucose, Bld: 168 mg/dL — ABNORMAL HIGH (ref 70–99)
Potassium: 4.7 mmol/L (ref 3.5–5.1)
Sodium: 142 mmol/L (ref 135–145)
Total Bilirubin: 0.8 mg/dL (ref 0.3–1.2)
Total Protein: 5.8 g/dL — ABNORMAL LOW (ref 6.5–8.1)

## 2022-03-24 MED ORDER — SODIUM BICARBONATE 650 MG PO TABS: 650.0000 mg | ORAL_TABLET | Freq: Two times a day (BID) | ORAL | 3 refills | Status: AC

## 2022-03-24 NOTE — Progress Notes (Signed)
Pt d/c home via brother. Education completed. All belongings sent with pt.

## 2022-03-24 NOTE — Care Management Important Message (Signed)
Important Message  Patient Details  Name: Darius Parker MRN: [id number] Date of Birth: [birth year]   Medicare Important Message Given:  Yes     Juliann Pulse A Rox Mcgriff 03/24/2022, 1:29 PM

## 2022-03-24 NOTE — TOC Transition Note (Signed)
Transition of Care [facility]) - CM/SW Discharge Note   Patient Details  Name: Darius Parker MRN: [id number] Date of Birth: Jan 11, 1946  [facility]) CM/SW Contact:  Colen Darling, [place] Phone Number: 03/24/2022, 4:29 PM   Clinical Narrative:     Patient has been accepted to [facility] for physical therapy.  Final next level of care: [place]     Patient Goals and CMS Choice   CMS Medicare.gov Compare Post Acute Care list provided to:: Patient Choice offered to / list presented to : Patient  Discharge Placement                    Patient and family notified of of transfer: 03/24/22  Discharge Plan and Services                          HH Arranged: PT [facility] Agency: [facility] Date [facility] Agency Contacted: 03/24/22 Time [place]: 1628 Representative spoke with at [place]: [place] (SDOH) Interventions     Readmission Risk Interventions     No data to display

## 2022-03-24 NOTE — Evaluation (Signed)
Physical Therapy Evaluation Patient Details Name: Darius Parker MRN: [id number] DOB: 03-15-1946 Today's Date: 03/24/2022  History of Present Illness  Pt is a 76 yo male that presented to the ED for SOB, and per advice of MD. CT abdomen/pelvis showed "small malignant ascites, extensive omental implants consistent with metastatic disease.  Ill-defined mass in the body of the pancreas concerning for malignancy." PMH of PE, GERD, heart murmur.   Clinical Impression   Pt agreeable to PT with some encouragement, verbose throughout session. Denied pain, and reported at baseline he is independent, but had recently put up a ramp and purchased a power wheelchair. Previously modI for IADLs as well, drives.  The patient demonstrated supine <> sit with supervision. Several minutes spent in sitting with good balance for RN to provide medications. Sit <> stand with RW and CGA, cued for hand placement. He ambulated ~64f total, no LOB. Pt able to conversate throughout ambulation, reciprocal gait pattern noted.  Overall the patient demonstrated deficits mild deficits in functional abilities, safety, and mobility and would benefit from skilled PT intervention. Recommendation is HHPT with PRN assistance.          Recommendations for follow up therapy are one component of a multi-disciplinary discharge planning process, led by the attending physician.  Recommendations may be updated based on patient status, additional functional criteria and insurance authorization.  Follow Up Recommendations Home health PT      Assistance Recommended at Discharge PRN  Patient can return home with the following  Help with stairs or ramp for entrance    Equipment Recommendations Rolling walker (2 wheels)  Recommendations for Other Services       Functional Status Assessment Patient has had a recent decline in their functional status and demonstrates the ability to make significant improvements in function in a reasonable  and predictable amount of time.     Precautions / Restrictions Precautions Precautions: Fall Restrictions Weight Bearing Restrictions: No      Mobility  Bed Mobility Overal bed mobility: Needs Assistance Bed Mobility: Supine to Sit, Sit to Supine     Supine to sit: Supervision Sit to supine: Supervision        Transfers Overall transfer level: Needs assistance Equipment used: Rolling walker (2 wheels) Transfers: Sit to/from Stand Sit to Stand: Min guard                Ambulation/Gait Ambulation/Gait assistance: Supervision Gait Distance (Feet): 70 Feet Assistive device: Rolling walker (2 wheels)         General Gait Details: no LOB noted, pt able to conversate and ambulate simultaneously  Stairs            Wheelchair Mobility    Modified Rankin (Stroke Patients Only)       Balance Overall balance assessment: Needs assistance Sitting-balance support: Feet supported Sitting balance-Leahy Scale: Good Sitting balance - Comments: able to sit for several minutes and take medications with RN at bedside     Standing balance-Leahy Scale: Fair                               Pertinent Vitals/Pain Pain Assessment Pain Assessment: No/denies pain    Home Living Family/patient expects to be discharged to:: Private residence Living Arrangements:  (sister in lSports coach Available Help at Discharge: Family Type of Home: House Home Access: Ramped entrance       Home Layout: One level Home Equipment: WBuilding control surveyor(  4 wheels)      Prior Function Prior Level of Function : Independent/Modified Independent;Driving                     Hand Dominance        Extremity/Trunk Assessment   Upper Extremity Assessment Upper Extremity Assessment: Generalized weakness    Lower Extremity Assessment Lower Extremity Assessment: Generalized weakness    Cervical / Trunk Assessment Cervical / Trunk Assessment: Normal   Communication   Communication: No difficulties  Cognition Arousal/Alertness: Awake/alert Behavior During Therapy: WFL for tasks assessed/performed Overall Cognitive Status: Within Functional Limits for tasks assessed                                 General Comments: verbose        General Comments      Exercises     Assessment/Plan    PT Assessment Patient needs continued PT services  PT Problem List Decreased strength;Decreased mobility;Decreased range of motion;Decreased activity tolerance;Decreased balance;Decreased knowledge of use of DME       PT Treatment Interventions Therapeutic activities;DME instruction;Gait training;Therapeutic exercise;Patient/family education;Stair training;Balance training;Functional mobility training;Neuromuscular re-education    PT Goals (Current goals can be found in the Care Plan section)  Acute Rehab PT Goals Patient Stated Goal: to rest PT Goal Formulation: With patient Time For Goal Achievement: 04/07/22 Potential to Achieve Goals: Good    Frequency Min 2X/week     Co-evaluation               AM-PAC PT "6 Clicks" Mobility  Outcome Measure Help needed turning from your back to your side while in a flat bed without using bedrails?: None Help needed moving from lying on your back to sitting on the side of a flat bed without using bedrails?: None Help needed moving to and from a bed to a chair (including a wheelchair)?: None Help needed standing up from a chair using your arms (e.g., wheelchair or bedside chair)?: None Help needed to walk in hospital room?: None Help needed climbing 3-5 steps with a railing? : A Little 6 Click Score: 23    End of Session Equipment Utilized During Treatment: Gait belt Activity Tolerance: Patient tolerated treatment well Patient left: in bed;with call bell/phone within reach;with bed alarm set Nurse Communication: Mobility status PT Visit Diagnosis: Other abnormalities of gait  and mobility (R26.89);History of falling (Z91.81);Difficulty in walking, not elsewhere classified (R26.2)    Time: 1030-1056 PT Time Calculation (min) (ACUTE ONLY): 26 min   Charges:   PT Evaluation $PT Eval Low Complexity: 1 Low PT Treatments $Therapeutic Activity: 8-22 mins        Lieutenant Diego PT, DPT 1:03 PM,03/24/22

## 2022-03-24 NOTE — TOC Transition Note (Signed)
Transition of Care [facility]) - CM/SW Discharge Note   Patient Details  Name: Darius Parker MRN: [id number] Date of Birth: 1945/08/07  Transition [facility] Care [facility] CM/SW Contact:  Colen Darling, [place] Phone Number: 03/24/2022, 3:50 PM   Clinical Narrative:     The patient is agreeable to outpatient PT at [facility]. TOC asked provider for a referral to outpatient PT for this patient.  Final next level of care: OP Rehab     Patient Goals and CMS Choice   CMS Medicare.gov Compare Post Acute Care list provided to:: Patient Choice offered to / list presented to : Patient  Discharge Placement                 Outpatient PT   Patient and family notified of of transfer: 03/24/22  Discharge Plan and Services                             Outpatient PT, discharge home        Social Determinants of Health (SDOH) Interventions     Readmission Risk Interventions     No data to display

## 2022-03-24 NOTE — TOC Progression Note (Addendum)
Transition of Care [facility]) - Progression Note    Patient Details  Name: Darius Parker MRN: [id number] Date of Birth: 03/24/1946  Transition of Care [facility]) CM/SW [place], [place] Phone Number: 03/24/2022, 1:34 PM  Clinical Narrative:      The patient will need HHPT and DME. TOC asking patient if he needs a walker ordered. He has a power wheelchair. TOC providing list of [place] options. Plan to discharge home today.  1400: The patient is agreeable to HHPT. He will need a HHPT order. He has a walker at home and does not need DME ordered. His brother can provide transport home.   1413: TOC has reached out to [employer], [facility], [place], [place] and [facility]. [facility] cannot accept. TOC waiting to hear back from the other agencies.  1425: [facility] does not accept the patient's insurance. [facility] does not accept the patient's insurance. [facility] does not have staffing in the patient's town. TOC is waiting to hear back from [facility] and [facility].  1549: The patient is agreeable to outpatient PT at [facility]. TOC asked MD for outpatient PT order.    Expected Discharge Plan and Services      HHPT with walker, family transport home     Expected Discharge Date: 03/24/22                                     Social Determinants of Health (SDOH) Interventions    Readmission Risk Interventions     No data to display

## 2022-03-24 NOTE — Discharge Summary (Signed)
Physician Discharge Summary   Patient: Darius Parker MRN: [id number] DOB: [birth year]  Admit date:     03/21/2022  Discharge date: 03/24/22  Discharge Physician: Estill Cotta, MD    PCP: Birdie Sons, MD   Recommendations at discharge:   Continue to hold Lasix, Vasotec, meloxicam Outpatient follow-up with BMET Underwent CT-guided omental biopsy, biopsy results to be followed by Dr. Janese Banks and outpatient appointment has been scheduled on 03/29/2022.  Discharge Diagnoses:    AKI (acute kidney injury) ([place])   Elevated alkaline phosphatase level   Pancreatic mass on CT with extensive omental implants, metastatic disease, unknown primary   Essential hypertension   Chronic systolic CHF (congestive heart failure), NYHA class 2 (HCC)   Hyperlipidemia, mixed   Coronary artery disease involving native coronary artery of native heart without angina pectoris   Hyperkalemia   Elevated alkaline phosphatase level   BPH (benign prostatic hyperplasia)   Elevated troponin    Hospital Course:  Patient is a 76 year old male with hypertension, GERD, hyperlipidemia, hypothyroidism, BPH presented due to abnormal labs. Patient reported that over the last couple months he has been feeling increasingly weak and tired, worsening shortness of breath with exertion going from his house to the [place].  His primary care physician ordered labs and was told that they were abnormal and was prompted to go to ED. In ED, found to have hyperkalemia 5.3, metabolic acidosis, normal anion gap, creatinine of 2.14.  Creatinine was 2.3 on 03/20/2022   CT abdomen/pelvis showed small malignant ascites, extensive omental implants consistent with metastatic disease.  Ill-defined mass in the body of the pancreas concerning for malignancy, recommended MRI without and with contrast.  Cirrhosis with small ascites.  Assessment and Plan:   AKI (acute kidney injury) ([place]) super post on CKD stage IIIa, metabolic  acidosis -Baseline creatinine ~1.5, creatinine was 1.8 on 07/25/2021 -Presented with creatinine of 2.1, was 2.3 on 03/20/2022 with a labs done by PCP -Continue to hold Lasix, Vasotec, -Received IV fluid hydration, creatinine improving to 1.8 at discharge -Added sodium bicarb tabs 650 mg twice daily, outpatient bmet follow-up      Elevated alkaline phosphatase level, pancreatic mass on CT with extent of omental implants consistent with metastatic disease -Primary carcinoma unknown at this time, possibly pancreatic mass -Oncology consulted, seen by Dr. Janese Banks.  CT chest showed mildly enlarged right cardiophrenic angle lymph node concerning for metastatic disease otherwise no evidence of metastatic disease in the chest. -IR consulted, underwent CT-guided omental biopsy on 03/23/2022, biopsy results will be followed up by Dr. Janese Banks.  Appointment scheduled on 03/29/2022 at 1:30 PM. -CEA elevated 1505,  AFP 3.5, CA 19-9 pending -Per oncology, management would be mostly palliative     Essential hypertension -BP currently soft      Chronic systolic CHF (congestive heart failure), NYHA class 2 ([place]) -Prior echo in 2019 had shown EF of 20 to 25% -2D echo showed improved EF of 45-50%, G1 DD. -Continue to hold Lasix, Vasotec     Hyperlipidemia, mixed, CAD -Continue statin   Mild elevated troponin  -likely due to demand ischemia in the setting of chronic heart failure, AKI.  No chest pain     Hyperkalemia -Continue Lokelma     BPH (benign prostatic hyperplasia) -Resume Flomax, PSA 10.6      Pain control - [place] Controlled Substance Reporting System database was reviewed. and patient was instructed, not to drive, operate heavy machinery, perform activities at heights, swimming or participation in water activities  or provide baby-sitting services while on Pain, Sleep and Anxiety Medications; until their outpatient Physician has advised to do so again. Also recommended to not to take more than  prescribed Pain, Sleep and Anxiety Medications.  Consultants: Oncology, IR Procedures performed: CT-guided omental biopsy Disposition: Home Diet recommendation:  Discharge Diet Orders (From admission, onward)     Start     Ordered   03/24/22 0000  Diet - low sodium heart healthy        03/24/22 1221           Cardiac diet DISCHARGE MEDICATION: Allergies as of 03/24/2022       Reactions   Tetanus Toxoids Swelling        Medication List     STOP taking these medications    enalapril 10 MG tablet Commonly known as: VASOTEC   furosemide 20 MG tablet Commonly known as: LASIX   meloxicam 15 MG tablet Commonly known as: MOBIC       TAKE these medications    aspirin 81 MG chewable tablet 162 mg.   atorvastatin 10 MG tablet Commonly known as: LIPITOR Take 10 mg at bedtime by mouth.   carvedilol 12.5 MG tablet Commonly known as: COREG Take 12.5 mg by mouth 2 (two) times daily with a meal.   famotidine 20 MG tablet Commonly known as: PEPCID TAKE 1 TABLET BY MOUTH TWICE A DAY   levothyroxine 25 MCG tablet Commonly known as: SYNTHROID TAKE 1 TABLET BY MOUTH EVERY DAY   omega-3 acid ethyl esters 1 g capsule Commonly known as: LOVAZA Take 1 capsule (1 g total) by mouth 2 (two) times daily.   sodium bicarbonate 650 MG tablet Take 1 tablet (650 mg total) by mouth 2 (two) times daily.   tamsulosin 0.4 MG Caps capsule Commonly known as: FLOMAX Take 1 capsule (0.4 mg total) by mouth daily.               Discharge Care Instructions  (From admission, onward)           Start     Ordered   03/24/22 0000  If the dressing is still on your incision site when you go home, remove it on the third day after your surgery date. Remove dressing if it begins to fall off, or if it is dirty or damaged before the third day.        03/24/22 1221            Follow-up Information     Birdie Sons, MD. Schedule an appointment as soon as possible for a  visit in 2 week(s).   Specialty: Family Medicine Why: for hospital follow-up, obtain labs, BMET Contact information: [address] [place] [address] [phone]         Sindy Guadeloupe, MD Follow up on 03/29/2022.   Specialty: Oncology Why: at 1:30PM, for hospital follow-up and biopsy results Contact information: [place] [address] [phone]                Discharge Exam: Danley Danker Weights   03/22/22 0500  Weight: 88.4 kg   S: No acute complaints, tolerating diet, hoping to go home today.  Outpatient appointment scheduled with oncology, No significant pain  BP 114/71 (BP Location: Right Arm)   Pulse 90   Temp 98 F (36.7 C)   Resp 16   Ht '6\' 2"'$  (1.88 m)   Wt 88.4 kg   SpO2 96%  BMI 25.02 kg/m    Physical Exam General: Alert and oriented x 3, NAD Cardiovascular: S1 S2 clear, RRR.  Respiratory: CTAB, no wheezing, rales or rhonchi Gastrointestinal: Soft, nontender, nondistended, NBS Ext: no pedal edema bilaterally Neuro: no new deficits Psych: Normal affect and demeanor   Condition at discharge: fair  The results of significant diagnostics from this hospitalization (including imaging, microbiology, ancillary and laboratory) are listed below for reference.   Imaging Studies: CT ABDOMINAL MASS BIOPSY  Result Date: 03/23/2022 CLINICAL DATA:  Pancreatic mass and extensive peritoneal carcinomatosis. EXAM: CT GUIDED CORE BIOPSY OF OMENTAL/PERITONEAL MASS ANESTHESIA/SEDATION: Moderate (conscious) sedation was employed during this procedure. A total of Versed 1.0 mg and Fentanyl 50 mcg was administered intravenously by radiology nursing. Moderate Sedation Time: 10 minutes. The patient's level of consciousness and vital signs were monitored continuously by radiology nursing throughout the procedure under my direct supervision. PROCEDURE: The procedure risks, benefits, and alternatives were explained to the patient. Questions  regarding the procedure were encouraged and answered. The patient understands and consents to the procedure. A time-out was performed prior to initiating the procedure. CT was performed in a supine position to localize anterior omental/peritoneal tumor. The right abdominal wall was prepped with chlorhexidine in a sterile fashion, and a sterile drape was applied covering the operative field. A sterile gown and sterile gloves were used for the procedure. Local anesthesia was provided with 1% Lidocaine. Under CT guidance, a 17 gauge trocar needle was advanced into the anterior peritoneal cavity to the right of midline and anterior to the liver. After confirming needle tip position, 3 separate coaxial 18 gauge core biopsy samples were obtained and submitted in formalin. Additional CT was performed after needle removal. RADIATION DOSE REDUCTION: This exam was performed according to the departmental dose-optimization program which includes automated exposure control, adjustment of the mA and/or kV according to patient size and/or use of iterative reconstruction technique. COMPLICATIONS: None FINDINGS: Omental tumor in the anterior peritoneal cavity anterior to the left lobe of the liver was targeted for sampling. Solid tissue was obtained. IMPRESSION: CT-guided core biopsy performed of omental tumor anterior to the liver in the right upper abdomen. Electronically Signed   By: Aletta Edouard M.D.   On: 03/23/2022 13:42   ECHOCARDIOGRAM COMPLETE  Result Date: 03/22/2022    ECHOCARDIOGRAM REPORT   Patient Name:   MANPREET STREY Dunbar Date of Exam: 03/22/2022 Medical Rec #:  [id number]     Height:       74.0 in Accession #:    [id number]    Weight:       194.9 lb Date of Birth:  1945/09/23    BSA:          2.150 m Patient Age:    89 years      BP:           124/69 mmHg Patient Gender: M             HR:           83 bpm. Exam Location:  [facility] Procedure: 2D Echo, Color Doppler and Cardiac Doppler Indications:     Dyspnea R06.00                   Elevated troponin  History:         Patient has prior history of Echocardiogram examinations, most                  recent 02/09/2018. Signs/Symptoms:Murmur. Pulmonary embolism.  Sonographer:     Sherrie Sport Referring Phys:  [id number] AMY N COX Diagnosing Phys: Yolonda Kida MD  Sonographer Comments: Suboptimal apical window. Image acquisition challenging due to patient body habitus. IMPRESSIONS  1. Apical septal Akinesis/anuerysmal segment WMA.  2. Mild global hypo.  3. Left ventricular ejection fraction, by estimation, is 45 to 50%. The left ventricle has mildly decreased function. The left ventricle demonstrates regional wall motion abnormalities (see scoring diagram/findings for description). The left ventricular  internal cavity size was moderately to severely dilated. There is mild concentric left ventricular hypertrophy of the septal segment. Left ventricular diastolic parameters are consistent with Grade I diastolic dysfunction (impaired relaxation).  4. Right ventricular systolic function is normal. The right ventricular size is normal.  5. The mitral valve is normal in structure. Trivial mitral valve regurgitation.  6. The aortic valve is normal in structure. Aortic valve regurgitation is not visualized. FINDINGS  Left Ventricle: Left ventricular ejection fraction, by estimation, is 45 to 50%. The left ventricle has mildly decreased function. The left ventricle demonstrates regional wall motion abnormalities. The left ventricular internal cavity size was moderately to severely dilated. There is mild concentric left ventricular hypertrophy of the septal segment. Left ventricular diastolic parameters are consistent with Grade I diastolic dysfunction (impaired relaxation). Right Ventricle: The right ventricular size is normal. No increase in right ventricular wall thickness. Right ventricular systolic function is normal. Left Atrium: Left atrial size was normal in size. Right Atrium: Right atrial  size was normal in size. Pericardium: There is no evidence of pericardial effusion. Mitral Valve: The mitral valve is normal in structure. Trivial mitral valve regurgitation. Tricuspid Valve: The tricuspid valve is normal in structure. Tricuspid valve regurgitation is mild. Aortic Valve: The aortic valve is normal in structure. Aortic valve regurgitation is not visualized. Aortic valve mean gradient measures 3.0 mmHg. Aortic valve peak gradient measures 4.4 mmHg. Aortic valve area, by VTI measures 2.89 cm. Pulmonic Valve: The pulmonic valve was normal in structure. Pulmonic valve regurgitation is not visualized. Aorta: The ascending aorta was not well visualized. IAS/Shunts: No atrial level shunt detected by color flow Doppler. Additional Comments: Apical septal Akinesis/anuerysmal segment WMA. Mild global hypo.  LEFT VENTRICLE PLAX 2D LVIDd:         5.70 cm      Diastology LVIDs:         4.30 cm      LV e' medial:    5.11 cm/s LV PW:         1.30 cm      LV E/e' medial:  8.2 LV IVS:        1.50 cm      LV e' lateral:   5.87 cm/s LVOT diam:     2.30 cm      LV E/e' lateral: 7.1 LV SV:         53 LV SV Index:   25 LVOT Area:     4.15 cm  LV Volumes (MOD) LV vol d, MOD A2C: 162.0 ml LV vol d, MOD A4C: 168.0 ml LV vol s, MOD A2C: 134.0 ml LV vol s, MOD A4C: 137.0 ml LV SV MOD A2C:     28.0 ml LV SV MOD A4C:     168.0 ml LV SV MOD BP:      27.3 ml RIGHT VENTRICLE RV Basal diam:  2.30 cm RV Mid diam:    1.50 cm RV S prime:     10.30 cm/s TAPSE (M-mode): 1.6  cm LEFT ATRIUM             Index        RIGHT ATRIUM          Index LA diam:        3.60 cm 1.67 cm/m   RA Area:     6.54 cm LA Vol (A2C):   64.9 ml 30.19 ml/m  RA Volume:   9.87 ml  4.59 ml/m LA Vol (A4C):   16.1 ml 7.49 ml/m LA Biplane Vol: 35.4 ml 16.47 ml/m  AORTIC VALVE AV Area (Vmax):    2.62 cm AV Area (Vmean):   2.20 cm AV Area (VTI):     2.89 cm AV Vmax:           105.00 cm/s AV Vmean:          84.600 cm/s AV VTI:            0.184 m AV Peak Grad:       4.4 mmHg AV Mean Grad:      3.0 mmHg LVOT Vmax:         66.30 cm/s LVOT Vmean:        44.700 cm/s LVOT VTI:          0.128 m LVOT/AV VTI ratio: 0.70  AORTA Ao Root diam: 3.50 cm MITRAL VALVE               TRICUSPID VALVE MV Area (PHT): 5.70 cm    TR Peak grad:   11.8 mmHg MV Decel Time: 133 msec    TR Vmax:        172.00 cm/s MV E velocity: 41.90 cm/s MV A velocity: 90.10 cm/s  SHUNTS MV E/A ratio:  0.47        Systemic VTI:  0.13 m                            Systemic Diam: 2.30 cm Yolonda Kida MD Electronically signed by Yolonda Kida MD Signature Date/Time: 03/22/2022/4:47:39 PM    Final    CT CHEST WO CONTRAST  Result Date: 03/22/2022 CLINICAL DATA:  Pancreatic cancer staging; * Tracking Code: BO * EXAM: CT CHEST WITHOUT CONTRAST TECHNIQUE: Multidetector CT imaging of the chest was performed following the standard protocol without IV contrast. RADIATION DOSE REDUCTION: This exam was performed according to the departmental dose-optimization program which includes automated exposure control, adjustment of the mA and/or kV according to patient size and/or use of iterative reconstruction technique. COMPARISON:  None Available. FINDINGS: Cardiovascular: Normal heart size. No pericardial effusion. Moderate coronary artery calcifications. Normal caliber thoracic aorta with mild calcified plaque. Mediastinum/Nodes: Small hiatal hernia. Mildly enlarged right cardiophrenic angle lymph node measuring 1.1 cm in short axis on series 2, image 116. No enlarged mediastinal, hilar or axillary lymph nodes. Lungs/Pleura: Central airways are patent. Trace right pleural effusion. Calcified pleural plaques. Left-greater-than-right bibasilar atelectasis. No suspicious pulmonary nodules. Upper Abdomen: Partially visualized upper abdomen with small volume ascites, omental caking, and numerous peritoneal nodules, better evaluated on recent separately dictated contrast-enhanced CT of the abdomen and pelvis. No acute  abnormality. Musculoskeletal: No chest wall mass or suspicious bone lesions identified. IMPRESSION: 1. Mildly enlarged right cardiophrenic angle lymph node, concerning for metastatic disease. Otherwise, no evidence of metastatic disease in the chest. 2. Trace right pleural effusion. 3. Bilateral calcified pleural plaques, findings can be seen in the setting of asbestosis exposure. 4. Partially visualized upper  abdomen with small volume ascites, omental caking, and numerous peritoneal nodules, better evaluated on recent separately dictated contrast-enhanced CT of the abdomen and pelvis. 5. Aortic Atherosclerosis (ICD10-I70.0). Electronically Signed   By: Yetta Glassman M.D.   On: 03/22/2022 15:00   CT ABDOMEN PELVIS WO CONTRAST  Result Date: 03/21/2022 CLINICAL DATA:  Abdominal pain. EXAM: CT ABDOMEN AND PELVIS WITHOUT CONTRAST TECHNIQUE: Multidetector CT imaging of the abdomen and pelvis was performed following the standard protocol without IV contrast. RADIATION DOSE REDUCTION: This exam was performed according to the departmental dose-optimization program which includes automated exposure control, adjustment of the mA and/or kV according to patient size and/or use of iterative reconstruction technique. COMPARISON:  None Available. FINDINGS: Evaluation of this exam is limited in the absence of intravenous contrast. Lower chest: Bibasilar subpleural thickening or possible trace effusion. There is lung bases are otherwise clear. No intra-abdominal free air.  Small ascites. Hepatobiliary: Cirrhosis. There is small stone and sludge within the gallbladder. Pancreas: There is an ill-defined mass in the body of the pancreas measuring approximately 5.3 x 3.1 cm (23/2). There is atrophy of the body and tail of the pancreas. Spleen: Several small calcified splenic granuloma. Adrenals/Urinary Tract: The adrenal glands are unremarkable. Mild bilateral renal parenchyma atrophy. Left renal cysts measure up to 2 cm.  Additional bilateral renal hypodense lesions are too small to characterize. There is no hydronephrosis or nephrolithiasis on either side. The visualized ureters and urinary bladder appear unremarkable. Stomach/Bowel: There is sigmoid diverticulosis. There is no bowel obstruction. The appendix is not identified with certainty. Vascular/Lymphatic: Mild aortoiliac atherosclerotic disease. The IVC is unremarkable. No portal venous gas. No retroperitoneal adenopathy. Reproductive: The prostate gland is enlarged measuring 6.3 cm in transverse axial diameter. Other: Extensive omental nodularity and caking consistent with implants. Scattered areas of nodularity also noted in the mesentery primarily in the right hemiabdomen. Musculoskeletal: Osteopenia with degenerative changes of the spine. No acute osseous pathology. Small fat containing umbilical hernia. IMPRESSION: 1. Small malignant ascites and extensive omental implants consistent with metastatic disease. 2. Ill-defined mass in the body of the pancreas, concerning for malignancy. Further evaluation with MRI with and without contrast recommended. 3. Cirrhosis with small ascites. 4. Cholelithiasis. 5. Sigmoid diverticulosis. No bowel obstruction. 6. Enlarged prostate gland. 7.  Aortic Atherosclerosis (ICD10-I70.0). Electronically Signed   By: Anner Crete M.D.   On: 03/21/2022 20:43   DG Chest 2 View  Result Date: 03/21/2022 CLINICAL DATA:  Shortness of breath. Shortness of breath on exertion. EXAM: CHEST - 2 VIEW COMPARISON:  12/21/2019, CT 02/08/2018 FINDINGS: Normal heart size and mediastinal contours. Calcified pleural plaques are similar to prior exam. There also calcified mediastinal lymph nodes. No pulmonary edema. No acute airspace disease. No pleural effusion or pneumothorax. No acute osseous abnormalities are seen. IMPRESSION: 1. No acute chest findings. 2. Calcified pleural plaques and calcified mediastinal lymph nodes, unchanged from prior exam.  Electronically Signed   By: Keith Rake M.D.   On: 03/21/2022 18:19   DG Chest 2 View  Result Date: 03/21/2022 CLINICAL DATA:  Shortness of breath. EXAM: CHEST - 2 VIEW COMPARISON:  Chest radiograph yesterday. Most recent chest CT 02/08/2018 FINDINGS: The heart is normal in size. Normal mediastinal contours. Calcified mediastinal lymph nodes. Nodular densities projecting over the lung bases are likely nipple shadows. There also chronic calcified pleural plaques. No pulmonary edema. No confluent consolidation, pleural effusion or pneumothorax. No acute osseous abnormalities are seen. IMPRESSION: 1. No acute chest findings. 2. Similar radiographic appearance  of calcified pleural plaques. 3. Nodular densities projecting over the lung bases are likely nipple shadows. Electronically Signed   By: Keith Rake M.D.   On: 03/21/2022 18:18   DG Shoulder Right  Result Date: 03/01/2022 CLINICAL DATA:  Pain right shoulder EXAM: RIGHT SHOULDER - 2+ VIEW COMPARISON:  08/20/2014 FINDINGS: No recent fracture or dislocation is seen. There is coarse calcification adjacent to the greater tuberosity of proximal humerus which was not evident in the previous study. IMPRESSION: No fracture or dislocation is seen in right shoulder. There is linear coarse calcification adjacent to the greater tuberosity of proximal humerus suggesting possible calcific bursitis or calcific tendinosis. Electronically Signed   By: Elmer Picker M.D.   On: 03/01/2022 20:16    Microbiology: Results for orders placed or performed during the hospital encounter of 06/07/20  SARS CORONAVIRUS 2 (TAT 6-24 HRS) Nasopharyngeal Nasopharyngeal Swab     Status: Abnormal   Collection Time: 06/07/20 12:03 PM   Specimen: Nasopharyngeal Swab  Result Value Ref Range Status   SARS Coronavirus 2 POSITIVE (A) NEGATIVE Final    Comment: (NOTE) SARS-CoV-2 target nucleic acids are DETECTED.  The SARS-CoV-2 RNA is generally detectable in upper and  lower respiratory specimens during the acute phase of infection. Positive results are indicative of the presence of SARS-CoV-2 RNA. Clinical correlation with patient history and other diagnostic information is  necessary to determine patient infection status. Positive results do not rule out bacterial infection or co-infection with other viruses.  The expected result is Negative.  Fact Sheet for Patients: SugarRoll.be  Fact Sheet for Healthcare Providers: https://www.woods-mathews.com/  This test is not yet approved or cleared by the [place] FDA and  has been authorized for detection and/or diagnosis of SARS-CoV-2 by FDA under an Emergency Use Authorization (EUA). This EUA will remain  in effect (meaning this test can be used) for the duration of the COVID-19 declaration under Section 564(b)(1) of the Act, 21 U. S.C. section 360bbb-3(b)(1), unless the authorization is terminated or revoked sooner.   Performed at [facility], [place] [address], [place], [place] [address]     Labs: CBC: Recent Labs  Lab 03/21/22 1755 03/21/22 2004 03/22/22 0422 03/23/22 0415 03/24/22 0623  WBC 17.2* 17.9* 11.8* 11.5* 12.7*  NEUTROABS  --  13.5*  --   --   --   HGB 12.1* 11.2* 9.8* 9.9* 10.0*  HCT 37.4* 33.7* 29.2* 30.2* 30.0*  MCV 92.8 89.4 89.0 88.8 88.0  PLT 160 156 104* 124* 976   Basic Metabolic Panel: Recent Labs  Lab 03/20/22 1452 03/21/22 1755 03/22/22 0422 03/23/22 0415 03/24/22 0623  NA 143 142 140 140 142  K 5.6* 5.3* 5.5* 5.0 4.7  CL 108* 110 114* 113* 114*  CO2 19* 21* 19* 19* 19*  GLUCOSE 146* 127* 120* 130* 168*  BUN 58* 61* 60* 57* 49*  CREATININE 2.31* 2.14* 2.01* 1.95* 1.80*  CALCIUM 9.4 9.1 8.3* 8.2* 8.4*   Liver Function Tests: Recent Labs  Lab 03/20/22 1452 03/21/22 1755 03/23/22 0415 03/24/22 0623  AST 37 41 28 30  ALT 42 38 31 28  ALKPHOS 339* 272* 215* 230*  BILITOT 0.5 0.9 0.8 0.8  PROT 7.0  7.3 5.8* 5.8*  ALBUMIN 3.9 3.2* 2.5* 2.4*   CBG: No results for input(s): "GLUCAP" in the last 168 hours.  Discharge time spent: greater than 30 minutes.  Signed: Estill Cotta, MD [facility] 03/24/2022

## 2022-03-25 LAB — CA 19-9 (SERIAL): CA 19-9: 159602 U/mL — ABNORMAL HIGH (ref 0–35)

## 2022-03-26 DIAGNOSIS — R404 Transient alteration of awareness: Secondary | ICD-10-CM | POA: Diagnosis not present

## 2022-03-26 DIAGNOSIS — R55 Syncope and collapse: Secondary | ICD-10-CM | POA: Diagnosis not present

## 2022-03-26 DIAGNOSIS — I499 Cardiac arrhythmia, unspecified: Secondary | ICD-10-CM | POA: Diagnosis not present

## 2022-03-26 DIAGNOSIS — R0602 Shortness of breath: Secondary | ICD-10-CM | POA: Diagnosis not present

## 2022-03-26 DIAGNOSIS — I469 Cardiac arrest, cause unspecified: Secondary | ICD-10-CM | POA: Diagnosis not present

## 2022-03-27 ENCOUNTER — Telehealth: Payer: Self-pay

## 2022-03-27 ENCOUNTER — Other Ambulatory Visit: Payer: Self-pay | Admitting: Pathology

## 2022-03-27 LAB — SURGICAL PATHOLOGY

## 2022-03-27 NOTE — Telephone Encounter (Signed)
Per Lavella Lemons sister n law- pt passed away at home 2022-04-16- just Juluis Rainier- family still wants to keep the appt on 04-03-22 to speak to Dr Caryn Section- if any questions please call   SonMatheus Spiker- [place] LPN [place] Direct Dial [phone]

## 2022-03-28 ENCOUNTER — Telehealth: Payer: Self-pay

## 2022-03-28 NOTE — Telephone Encounter (Signed)
Copied from [place] [id number]. Topic: General - Other >> Mar 28, 2022  4:03 PM Everette C wrote: Reason for [id number] with [facility] and [place] has called to notify the practice of the patient's recent death and request completion of the death certificate   The patient passed away 04-19-22  [place] DAVES [phone]  Please contact further when possible

## 2022-03-28 NOTE — Telephone Encounter (Signed)
I need approximate time of death to complete death certificate. Please check with funeral home to see if they have that information.

## 2022-03-29 ENCOUNTER — Telehealth: Payer: Self-pay | Admitting: Oncology

## 2022-03-29 ENCOUNTER — Inpatient Hospital Stay: Payer: PPO | Admitting: Oncology

## 2022-03-29 NOTE — Telephone Encounter (Signed)
Patients sister in law callewd to report patient passed away 2022-08-28 evening due to Heart Failure and will not be coming to his appointment. I have cancelled his HFU for today at 1:30.

## 2022-03-29 NOTE — Telephone Encounter (Signed)
Spoke with funeral home. They said he passed away on 04/24/23, at 20:05.

## 2022-03-30 ENCOUNTER — Inpatient Hospital Stay: Payer: PPO

## 2022-03-30 NOTE — Telephone Encounter (Signed)
Copied from [facility] [id number]. Topic: General - Other >> Mar 30, 2022  4:57 PM Ja-Kwan M wrote: Reason for CRM: Pt sister-in-law Lavella Lemons requests that Dr. Caryn Section contact pt brother Keturah Barre because he would like to discuss if there are any hereditary conditions that they need to be aware of. Cb# [phone]

## 2022-03-31 ENCOUNTER — Ambulatory Visit: Payer: PPO | Admitting: Family

## 2022-03-31 NOTE — Telephone Encounter (Signed)
I called patient's brother Keturah Barre (who is listed on the [facility]). I tried advising  him of Dr. Maralyn Sago message below. Milliard has more questions about what happened to his brother to cause his death. Keturah Barre says, all he knows is that he received a call from someone in our office saying that Mr. Summerson needed to go to the ER. Keturah Barre says he doesn't understand what happened, and would like to speak with someone to get answers.  Please advise if you are able to speak with patients brother Keturah Barre about this situation.

## 2022-03-31 NOTE — Telephone Encounter (Signed)
Only about 10% of cases of pancreatic cancer are genetic. I would suggest he talk to his PCP about a referral to genetic counseling.

## 2022-04-03 ENCOUNTER — Ambulatory Visit: Payer: PPO | Admitting: Family Medicine

## 2022-04-15 IMAGING — CR DG CHEST 2V
1 series · 2 of 2 positions shown · non-contrast
Comparison: 02/15/2018

CLINICAL DATA: Cough and sinus drainage.

EXAM:
CHEST - 2 VIEW

[Series 1: dg chest 2 view · 0.14mm/px · 2 of 2 slices shown]
[im 1/2]
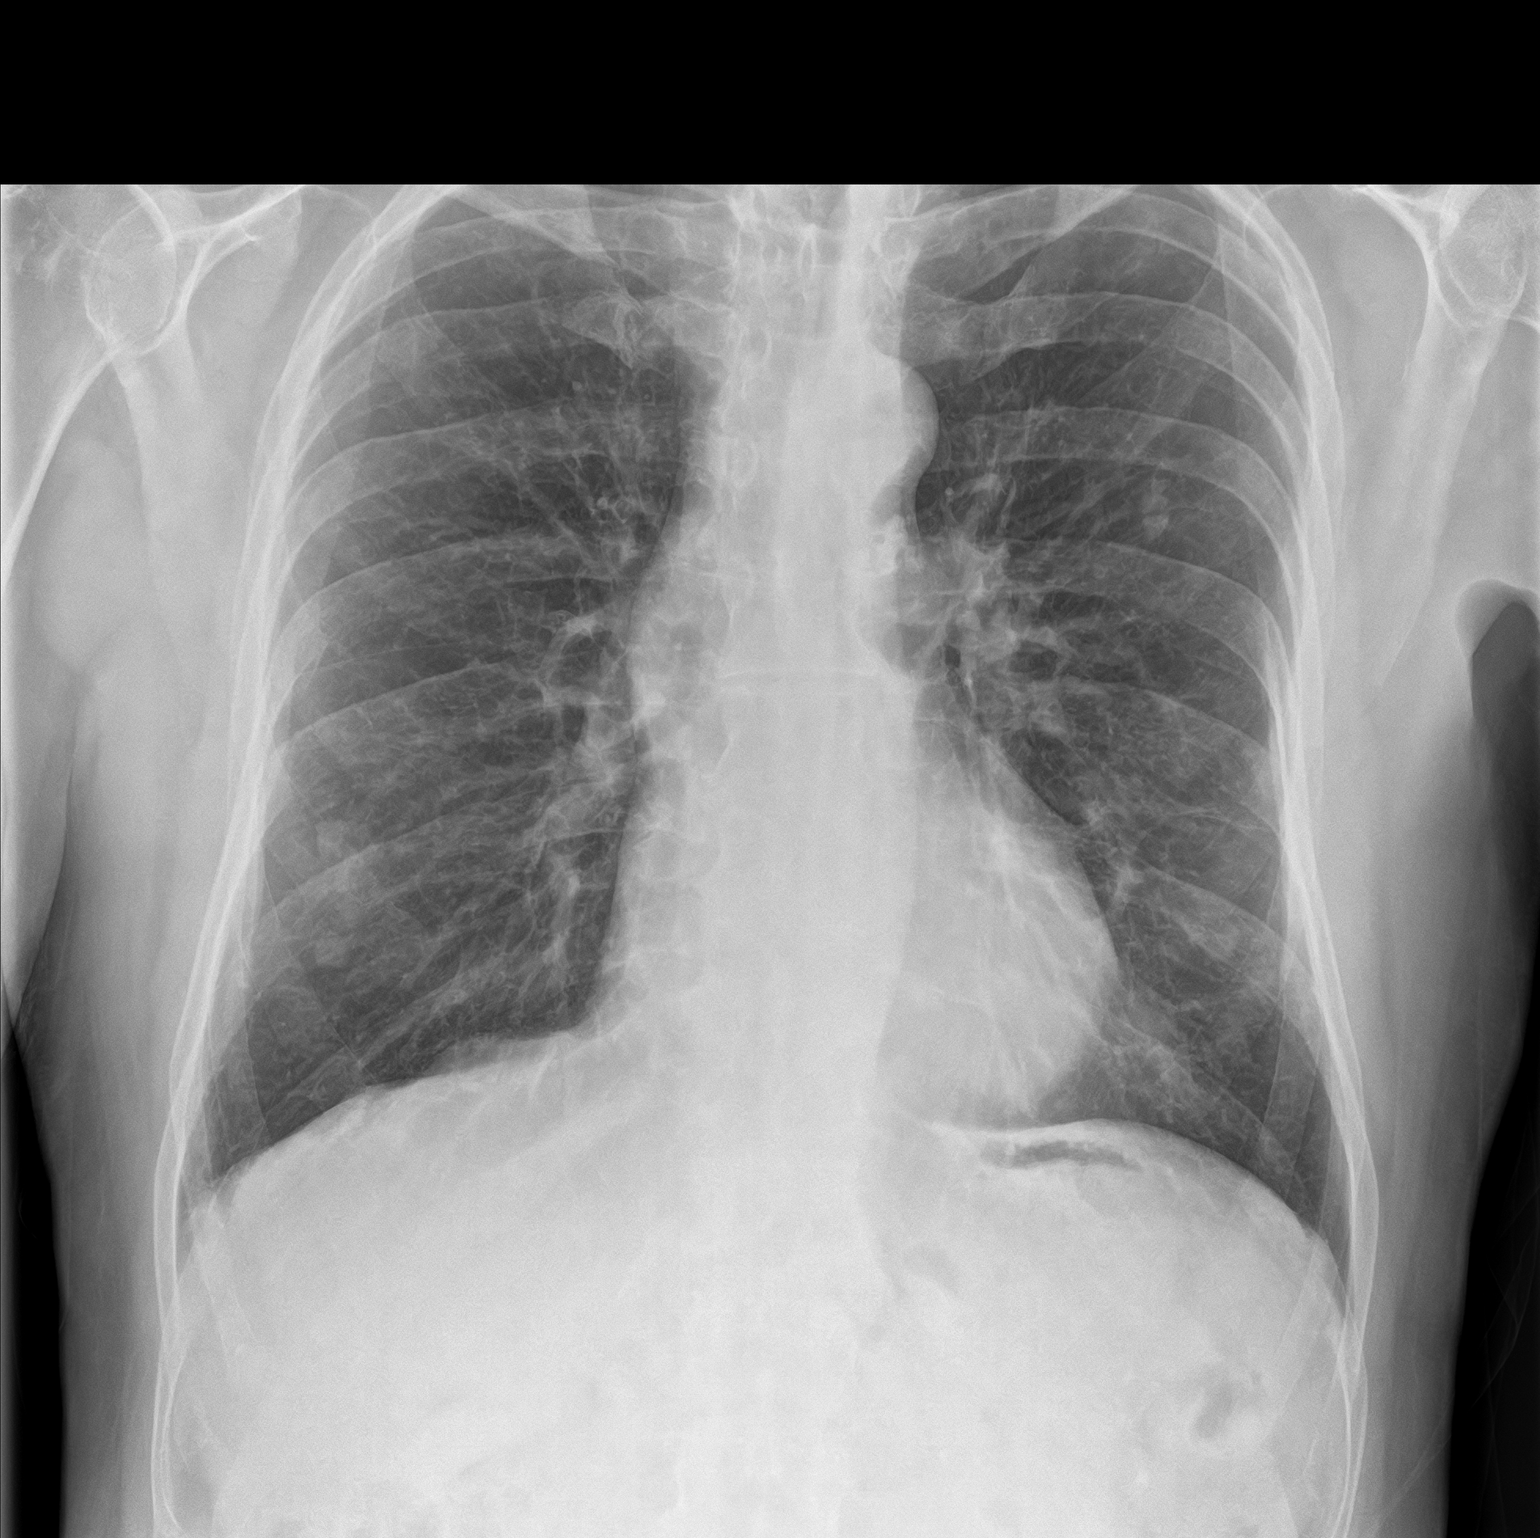
[im 2/2]
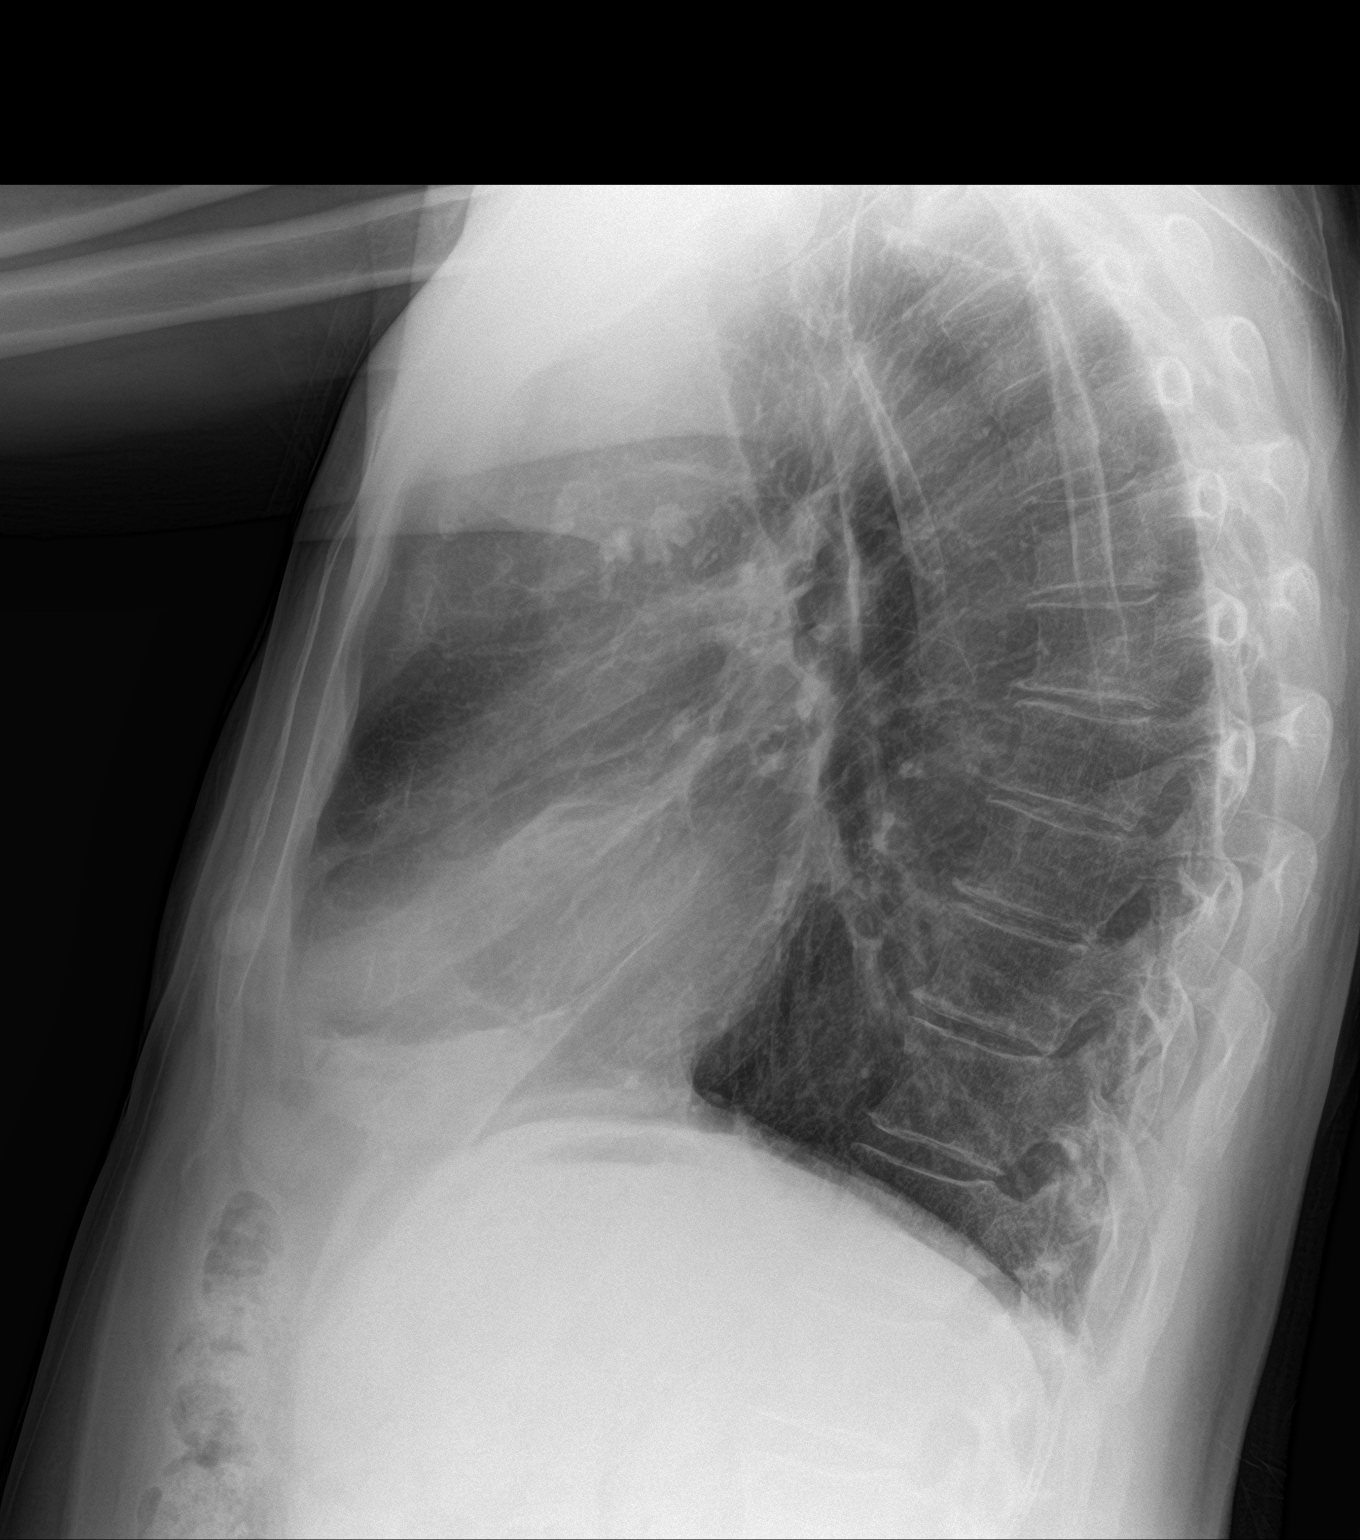

[2 of 2 positions shown; findings below may reference images not displayed]

FINDINGS: Lungs are adequately inflated without consolidation or effusion.
Cardiomediastinal silhouette is normal. Known bilateral pleural
calcification unchanged. Remainder of the exam is unchanged.
IMPRESSION: No active cardiopulmonary disease.

Evidence of asbestos related pleural disease.

## 2022-04-21 DEATH — deceased

## 2022-07-17 IMAGING — CR DG CHEST 2V
1 series · 2 of 2 positions shown · non-contrast
Comparison: September 19, 2019

CLINICAL DATA: Palpitations fatigue.  Elevated heart rate.

EXAM:
CHEST - 2 VIEW

[Series 1: dg chest 2 view · 0.14mm/px · 2 of 2 slices shown]
[im 1/2]
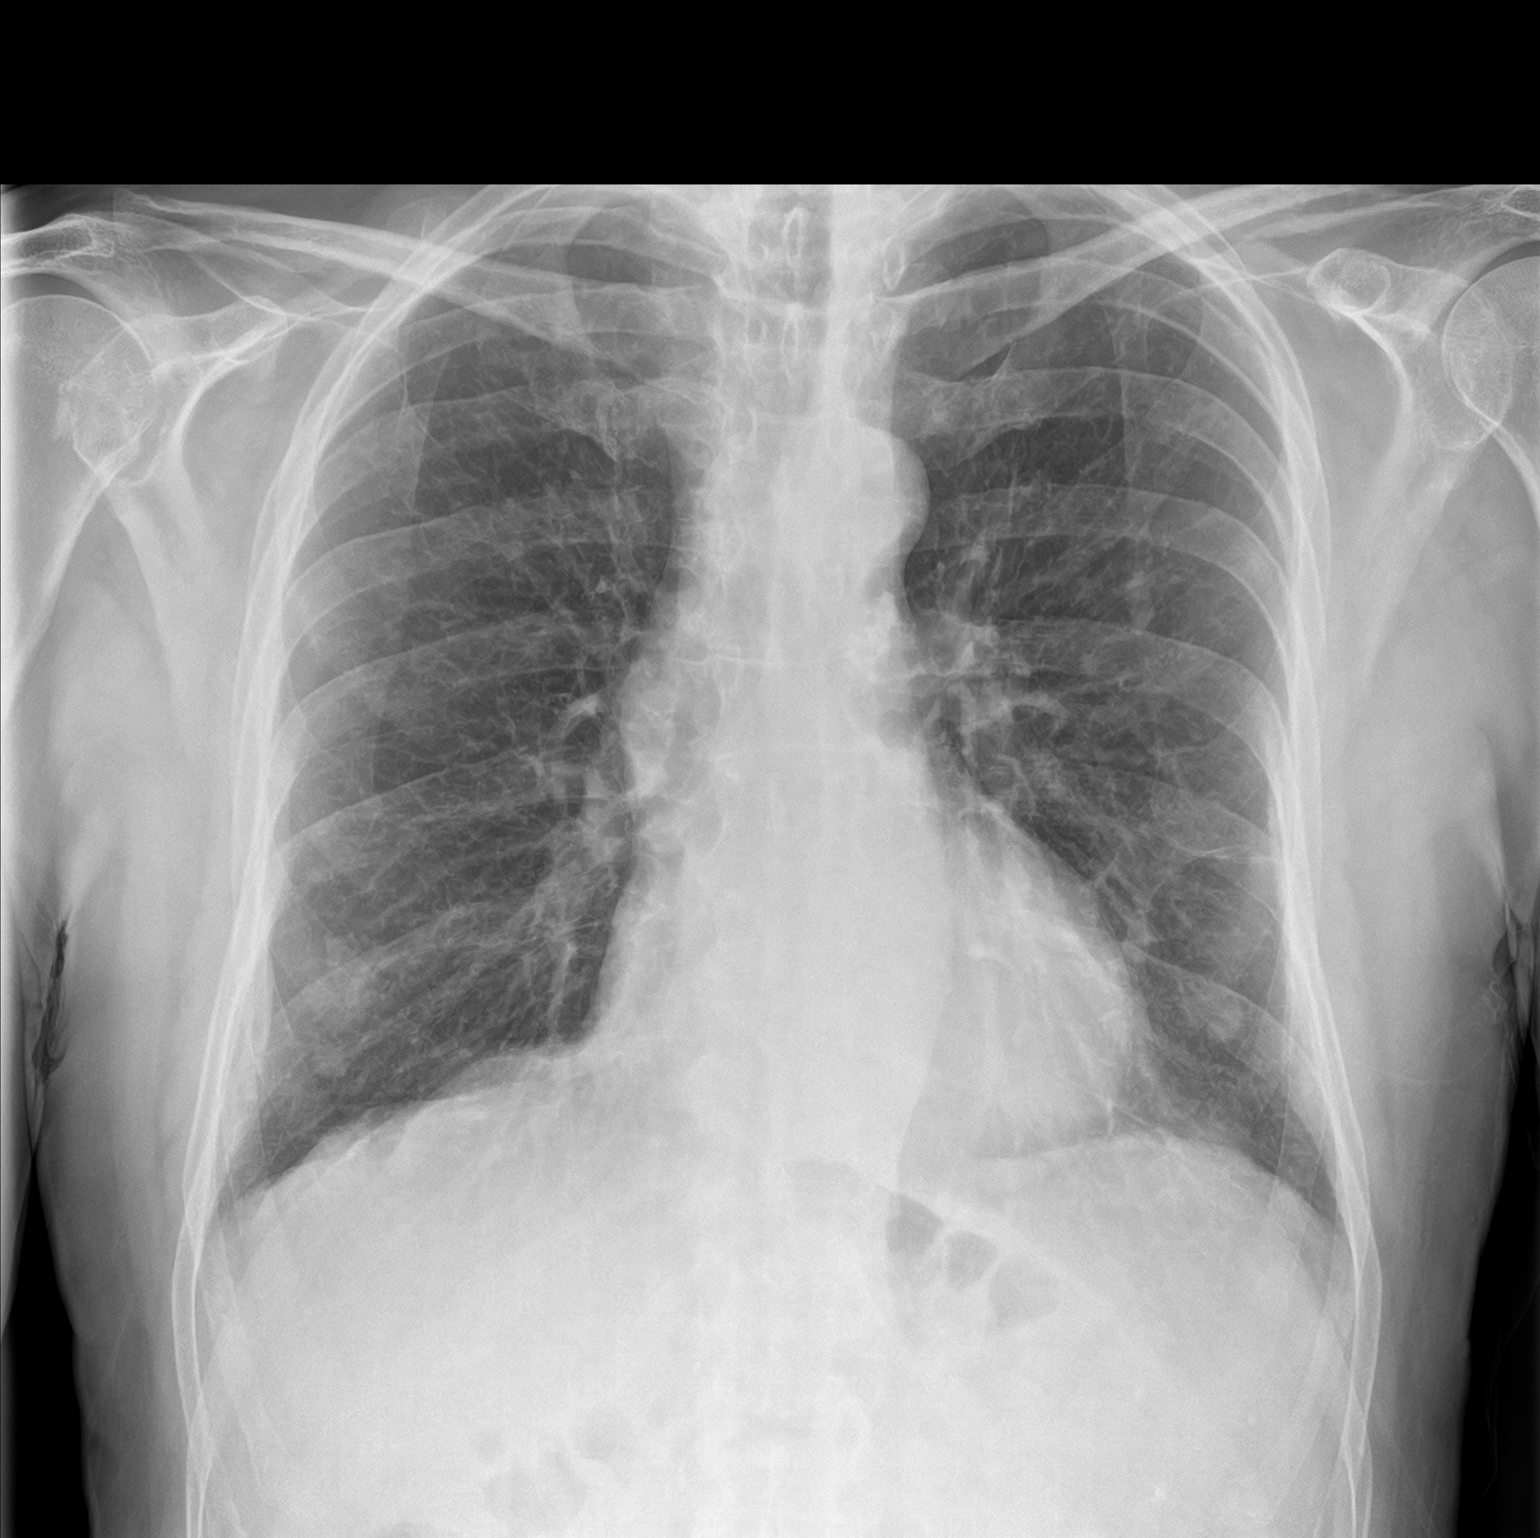
[im 2/2]
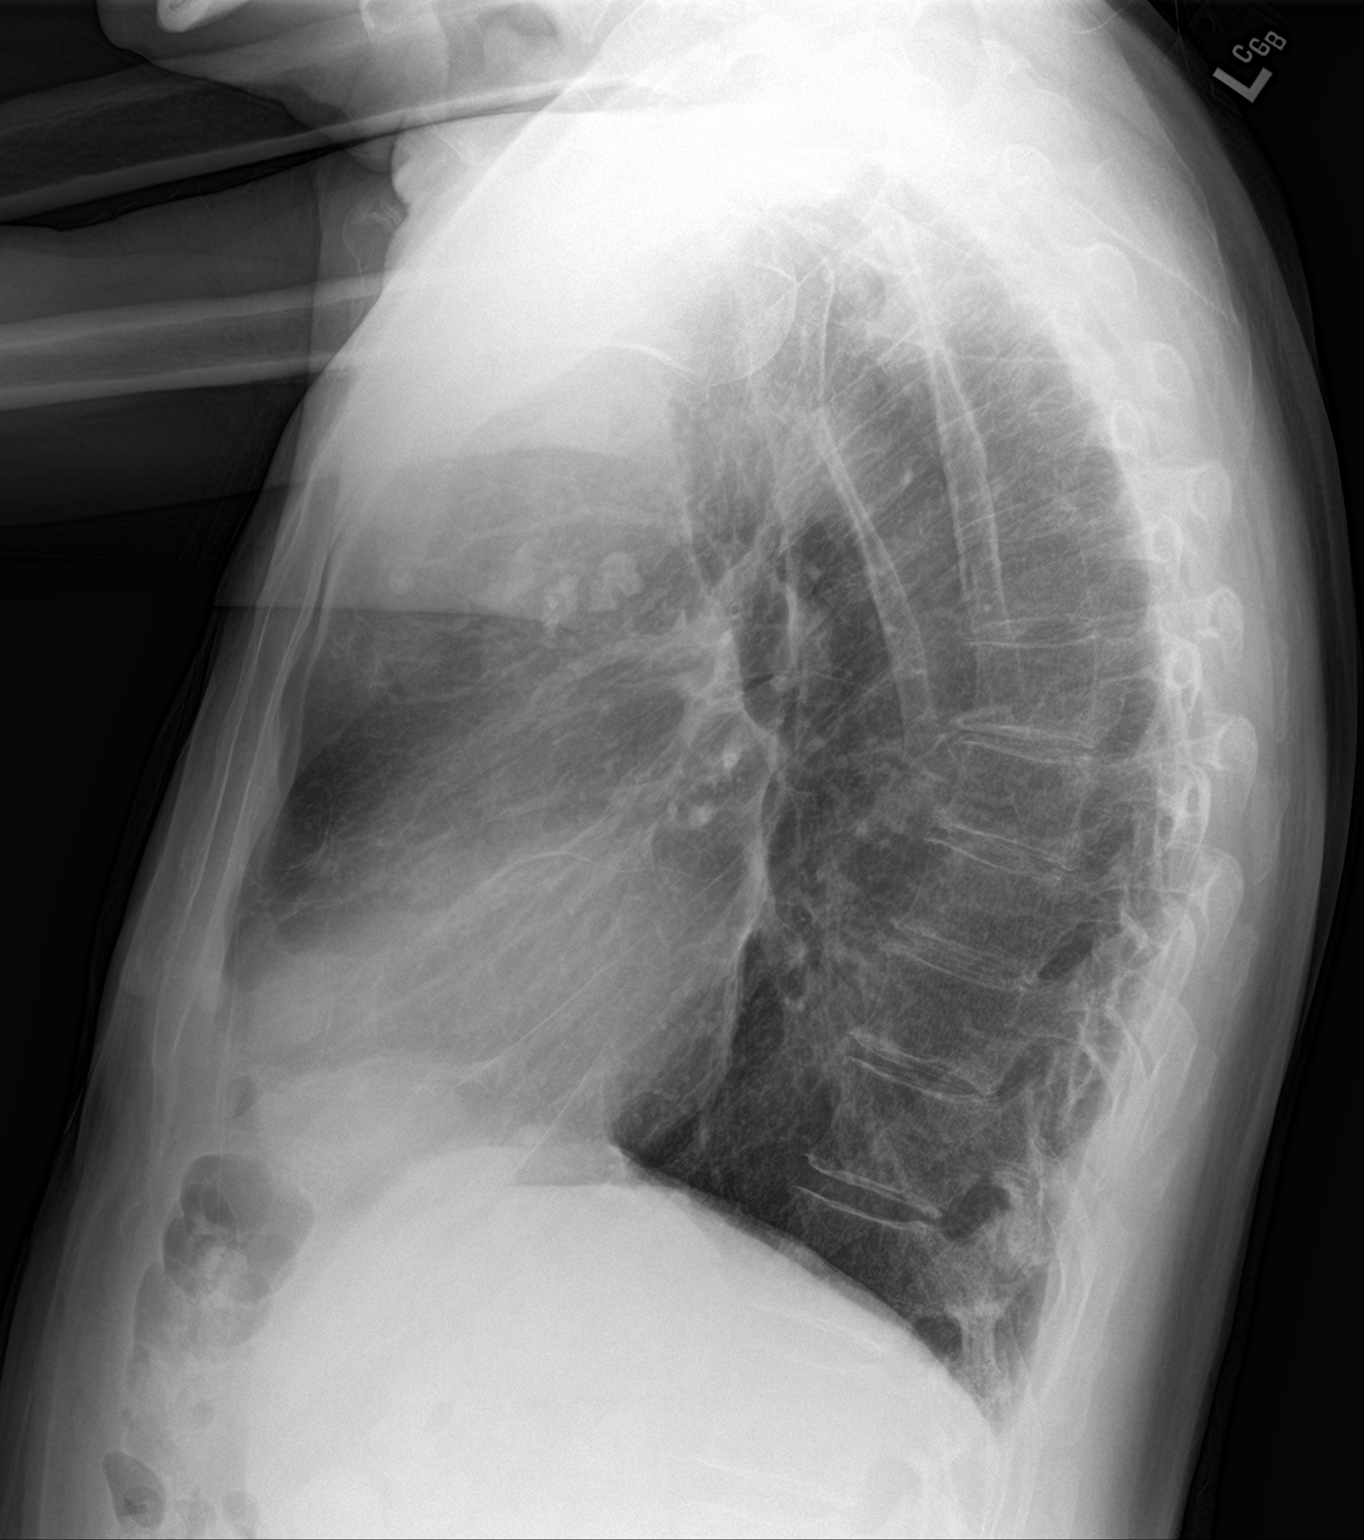

[2 of 2 positions shown; findings below may reference images not displayed]

FINDINGS: Trachea midline. Cardiomediastinal contours and hilar structures are
normal.

Lungs are clear. Densities projecting over the lower chest
bilaterally compatible with nipple shadows and calcified pleural
plaques unchanged from prior imaging.

Skeletal structures on limited assessment are unremarkable.
IMPRESSION: 1. No active cardiopulmonary disease.
2. Stable calcified pleural plaques.
